# Patient Record
Sex: Female | Born: 1965 | Race: White | Hispanic: No | Marital: Married | Smoking: Former smoker
Health system: Southern US, Community
[De-identification: ages and names within clinical notes are randomized; demographics above are authoritative.]

## PROBLEM LIST (undated history)

## (undated) DIAGNOSIS — E079 Disorder of thyroid, unspecified: Secondary | ICD-10-CM

## (undated) DIAGNOSIS — J45909 Unspecified asthma, uncomplicated: Secondary | ICD-10-CM

## (undated) DIAGNOSIS — F329 Major depressive disorder, single episode, unspecified: Secondary | ICD-10-CM

## (undated) DIAGNOSIS — G43909 Migraine, unspecified, not intractable, without status migrainosus: Secondary | ICD-10-CM

## (undated) DIAGNOSIS — F319 Bipolar disorder, unspecified: Secondary | ICD-10-CM

## (undated) DIAGNOSIS — I10 Essential (primary) hypertension: Secondary | ICD-10-CM

## (undated) DIAGNOSIS — F32A Depression, unspecified: Secondary | ICD-10-CM

## (undated) HISTORY — DX: Essential (primary) hypertension: I10

## (undated) HISTORY — DX: Depression, unspecified: F32.A

## (undated) HISTORY — DX: Migraine, unspecified, not intractable, without status migrainosus: G43.909

## (undated) HISTORY — DX: Unspecified asthma, uncomplicated: J45.909

## (undated) HISTORY — DX: Disorder of thyroid, unspecified: E07.9

## (undated) HISTORY — DX: Bipolar disorder, unspecified: F31.9

---
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
## 2008-07-24 HISTORY — PX: ABDOMINAL HYSTERECTOMY: SHX81

## 2008-07-24 HISTORY — PX: TOTAL ABDOMINAL HYSTERECTOMY: SHX209

## 2011-07-25 HISTORY — PX: THYROIDECTOMY: SHX17

## 2012-07-24 HISTORY — PX: CHOLECYSTECTOMY: SHX55

## 2014-06-26 DIAGNOSIS — E890 Postprocedural hypothyroidism: Secondary | ICD-10-CM | POA: Insufficient documentation

## 2014-06-26 DIAGNOSIS — E039 Hypothyroidism, unspecified: Secondary | ICD-10-CM | POA: Insufficient documentation

## 2014-07-24 DIAGNOSIS — I639 Cerebral infarction, unspecified: Secondary | ICD-10-CM

## 2014-07-24 HISTORY — DX: Cerebral infarction, unspecified: I63.9

## 2019-10-17 ENCOUNTER — Telehealth: Payer: Self-pay | Admitting: *Deleted

## 2019-10-17 ENCOUNTER — Encounter: Payer: Self-pay | Admitting: Family Medicine

## 2019-10-17 ENCOUNTER — Ambulatory Visit (INDEPENDENT_AMBULATORY_CARE_PROVIDER_SITE_OTHER): Payer: PRIVATE HEALTH INSURANCE | Admitting: Family Medicine

## 2019-10-17 ENCOUNTER — Other Ambulatory Visit: Payer: Self-pay

## 2019-10-17 VITALS — BP 135/80 | HR 95 | Temp 98.9°F | Ht 66.0 in | Wt 246.8 lb

## 2019-10-17 DIAGNOSIS — F329 Major depressive disorder, single episode, unspecified: Secondary | ICD-10-CM | POA: Insufficient documentation

## 2019-10-17 DIAGNOSIS — Z1211 Encounter for screening for malignant neoplasm of colon: Secondary | ICD-10-CM

## 2019-10-17 DIAGNOSIS — J45909 Unspecified asthma, uncomplicated: Secondary | ICD-10-CM | POA: Insufficient documentation

## 2019-10-17 DIAGNOSIS — E039 Hypothyroidism, unspecified: Secondary | ICD-10-CM

## 2019-10-17 DIAGNOSIS — F324 Major depressive disorder, single episode, in partial remission: Secondary | ICD-10-CM

## 2019-10-17 DIAGNOSIS — J4520 Mild intermittent asthma, uncomplicated: Secondary | ICD-10-CM

## 2019-10-17 DIAGNOSIS — Z8673 Personal history of transient ischemic attack (TIA), and cerebral infarction without residual deficits: Secondary | ICD-10-CM

## 2019-10-17 DIAGNOSIS — F319 Bipolar disorder, unspecified: Secondary | ICD-10-CM

## 2019-10-17 DIAGNOSIS — F32A Depression, unspecified: Secondary | ICD-10-CM | POA: Insufficient documentation

## 2019-10-17 MED ORDER — LISINOPRIL 20 MG PO TABS: 20.0000 mg | ORAL_TABLET | Freq: Every day | ORAL | 1 refills | Status: DC

## 2019-10-17 MED ORDER — LEVOTHYROXINE SODIUM 150 MCG PO TABS: 150.0000 ug | ORAL_TABLET | Freq: Every day | ORAL | 1 refills | Status: DC

## 2019-10-17 MED ORDER — CARIPRAZINE HCL 3 MG PO CAPS: 3.0000 mg | ORAL_CAPSULE | Freq: Every day | ORAL | 1 refills | Status: DC

## 2019-10-17 MED ORDER — TRAZODONE HCL 100 MG PO TABS: 100.0000 mg | ORAL_TABLET | Freq: Every day | ORAL | 1 refills | Status: DC

## 2019-10-17 MED ORDER — HYDROXYZINE HCL 25 MG PO TABS: 25.0000 mg | ORAL_TABLET | Freq: Two times a day (BID) | ORAL | 1 refills | Status: DC

## 2019-10-17 MED ORDER — BUPROPION HCL ER (XL) 300 MG PO TB24: 300.0000 mg | ORAL_TABLET | Freq: Every day | ORAL | 1 refills | Status: DC

## 2019-10-17 NOTE — Telephone Encounter (Signed)
Prior Auth for [employer] 3mg  caps-In Process  (Key: [id number])  Your information has been sent to [facility].

## 2019-10-17 NOTE — Progress Notes (Signed)
New Patient Office Visit  Assessment & Plan:  1. Major depressive disorder with single episode, in partial remission ([place]) - Well controlled on current regimen.  - CMP14+EGFR  2. Bipolar 1 disorder ([place]) - Well controlled on current regimen.  - CMP14+EGFR  3. Mild intermittent asthma without complication - Well controlled on current regimen.   4. Hypothyroidism, unspecified type - TSH  5. History of stroke - Lipid panel - CBC with Differential/Platelet  6. Colon cancer screening - Ambulatory referral to Gastroenterology   Follow-up: Return for as directed after labs result.   Hendricks Limes, MSN, APRN, FNP-C [facility]  Subjective:  Patient ID: Belinda Turner, female    DOB: May 27, 1966  Age: 54 y.o. MRN: [id number]  Patient Care Team: Loman Brooklyn, FNP as PCP - General (Family Medicine)  CC:  Chief Complaint  Patient presents with  . New Patient (Initial Visit)    Dr. Selinda Eon  . Establish Care    HPI Brizeyda Holtmeyer presents to establish care. Patient transferring from Dr. Selinda Eon due to a recent move.   She has been through a lot recently with a move in January, her mother-in-law dying in January, and her father dying in February. She feels her medications are working well for her but she does still have days that she feels depressed. She does not desire a change in medication at this time.   Depression screen PHQ 2/9 10/17/2019  Decreased Interest 1  Down, Depressed, Hopeless 1  PHQ - 2 Score 2  Altered sleeping 0  Tired, decreased energy 2  Change in appetite 0  Feeling bad or failure about yourself  0  Trouble concentrating 1  Moving slowly or fidgety/restless 0  Suicidal thoughts 0  PHQ-9 Score 5  Difficult doing work/chores Somewhat difficult   GAD 7 : Generalized Anxiety Score 10/17/2019  Nervous, Anxious, on Edge 1  Control/stop worrying 0  Worry too much - different things 1  Trouble relaxing 0  Restless 0  Easily  annoyed or irritable 1  Afraid - awful might happen 0  Total GAD 7 Score 3  Anxiety Difficulty Not difficult at all    Patient rarely has to use her Albuterol inhaler.   She does report the last time she had labs done four months ago her levothyroxine dosage had to be decreased.    Review of Systems  Constitutional: Negative for chills, fever, malaise/fatigue and weight loss.  HENT: Negative for congestion, ear discharge, ear pain, nosebleeds, sinus pain, sore throat and tinnitus.   Eyes: Negative for blurred vision, double vision, pain, discharge and redness.  Respiratory: Negative for cough, shortness of breath and wheezing.   Cardiovascular: Negative for chest pain, palpitations and leg swelling.  Gastrointestinal: Negative for abdominal pain, constipation, diarrhea, heartburn, nausea and vomiting.  Genitourinary: Negative for dysuria, frequency and urgency.  Musculoskeletal: Negative for myalgias.  Skin: Negative for rash.  Neurological: Negative for dizziness, seizures, weakness and headaches.  Psychiatric/Behavioral: Negative for depression, substance abuse and suicidal ideas. The patient is not nervous/anxious.     Current Outpatient Medications:  .  albuterol (VENTOLIN HFA) 108 (90 Base) MCG/ACT inhaler, Inhale 2 puffs into the lungs every 6 (six) hours as needed., Disp: , Rfl:  .  buPROPion (WELLBUTRIN XL) 300 MG 24 hr tablet, Take 1 tablet (300 mg total) by mouth daily., Disp: 90 tablet, Rfl: 1 .  cariprazine (VRAYLAR) capsule, Take 1 capsule (3 mg total) by mouth daily., Disp: 90 capsule, Rfl:  1 .  Chromium-Cinnamon (CINNAMON PLUS CHROMIUM) [phone] MCG-MG CAPS, Take 1,000 mcg by mouth daily., Disp: , Rfl:  .  glucosamine-chondroitin 500-400 MG tablet, Take 2 tablets by mouth daily., Disp: , Rfl:  .  hydrOXYzine (ATARAX/VISTARIL) 25 MG tablet, Take 1 tablet (25 mg total) by mouth in the morning and at bedtime., Disp: 180 tablet, Rfl: 1 .  levothyroxine (SYNTHROID) 150 MCG  tablet, Take 1 tablet (150 mcg total) by mouth daily before breakfast., Disp: 90 tablet, Rfl: 1 .  lisinopril (ZESTRIL) 20 MG tablet, Take 1 tablet (20 mg total) by mouth daily., Disp: 90 tablet, Rfl: 1 .  magnesium oxide (MAG-OX) 400 MG tablet, Take 400 mg by mouth daily., Disp: , Rfl:  .  Multiple Vitamin (MULTIVITAMIN) tablet, Take 1 tablet by mouth daily., Disp: , Rfl:  .  traZODone (DESYREL) 100 MG tablet, Take 1 tablet (100 mg total) by mouth at bedtime., Disp: 90 tablet, Rfl: 1  Allergies  Allergen Reactions  . Cortisone Nausea And Vomiting and Swelling  . Sulfur Nausea And Vomiting and Swelling    Past Medical History:  Diagnosis Date  . Asthma   . Bipolar 1 disorder ([place])   . Depression   . Migraine   . Stroke [facility]) 2016   2 strokes  . Thyroid disease     Past Surgical History:  Procedure Laterality Date  . ABDOMINAL HYSTERECTOMY  2010  . CESAREAN SECTION  2005  . CESAREAN SECTION  2006  . THYROIDECTOMY  2013    Family History  Problem Relation Age of Onset  . Depression Mother   . Thyroid disease Mother   . Diabetes Father   . Thyroid disease Sister   . ADD / ADHD Son   . Autism Son   . Bipolar disorder Son   . Thyroid disease Maternal Grandmother   . Breast cancer Maternal Grandmother   . Diabetes Paternal Grandmother   . Diabetes Paternal Grandfather     Social History   Socioeconomic History  . Marital status: Married    Spouse name: Not on file  . Number of children: Not on file  . Years of education: Not on file  . Highest education level: Not on file  Occupational History  . Not on file  Tobacco Use  . Smoking status: Former Smoker    Types: Cigarettes    Quit date: 1984    Years since quitting: 37.2  . Smokeless tobacco: Never Used  Substance and Sexual Activity  . Alcohol use: Yes    Comment: occ  . Drug use: Never  . Sexual activity: Yes    Birth control/protection: None  Other Topics Concern  . Not on file  Social History  Narrative  . Not on file   Social Determinants of Health   Financial Resource Strain:   . Difficulty of Paying Living Expenses:   Food Insecurity:   . Worried About Charity fundraiser in the Last Year:   . Arboriculturist in the Last Year:   Transportation Needs:   . Film/video editor (Medical):   Marland Kitchen Lack of Transportation (Non-Medical):   Physical Activity:   . Days of Exercise per Week:   . Minutes of Exercise per Session:   Stress:   . Feeling of Stress :   Social Connections:   . Frequency of Communication with Friends and Family:   . Frequency of Social Gatherings with Friends and Family:   . Attends Religious Services:   .  Active Member of Clubs or Organizations:   . Attends Archivist Meetings:   Marland Kitchen Marital Status:   Intimate Partner Violence:   . Fear of Current or Ex-Partner:   . Emotionally Abused:   Marland Kitchen Physically Abused:   . Sexually Abused:     Objective:   Today's Vitals: BP 135/80   Pulse 95   Temp 98.9 F (37.2 C) (Temporal)   Ht '5\' 6"'  (1.676 m)   Wt 246 lb 12.8 oz (111.9 kg)   SpO2 93%   BMI 39.83 kg/m   Physical Exam Vitals reviewed.  Constitutional:      General: She is not in acute distress.    Appearance: Normal appearance. She is obese. She is not ill-appearing, toxic-appearing or diaphoretic.  HENT:     Head: Normocephalic and atraumatic.  Eyes:     General: No scleral icterus.       Right eye: No discharge.        Left eye: No discharge.     Conjunctiva/sclera: Conjunctivae normal.  Cardiovascular:     Rate and Rhythm: Normal rate and regular rhythm.     Heart sounds: Normal heart sounds. No murmur. No friction rub. No gallop.   Pulmonary:     Effort: Pulmonary effort is normal. No respiratory distress.     Breath sounds: Normal breath sounds. No stridor. No wheezing, rhonchi or rales.  Musculoskeletal:        General: Normal range of motion.     Cervical back: Normal range of motion.  Skin:    General: Skin is warm  and dry.     Capillary Refill: Capillary refill takes less than 2 seconds.  Neurological:     General: No focal deficit present.     Mental Status: She is alert and oriented to person, place, and time. Mental status is at baseline.  Psychiatric:        Mood and Affect: Mood normal.        Behavior: Behavior normal.        Thought Content: Thought content normal.        Judgment: Judgment normal.

## 2019-10-18 LAB — CBC WITH DIFFERENTIAL/PLATELET
Basophils Absolute: 0 10*3/uL (ref 0.0–0.2)
Basos: 1 %
EOS (ABSOLUTE): 0.2 10*3/uL (ref 0.0–0.4)
Eos: 3 %
Hematocrit: 41.6 % (ref 34.0–46.6)
Hemoglobin: 13.3 g/dL (ref 11.1–15.9)
Immature Grans (Abs): 0 10*3/uL (ref 0.0–0.1)
Immature Granulocytes: 0 %
Lymphocytes Absolute: 1.8 10*3/uL (ref 0.7–3.1)
Lymphs: 27 %
MCH: 27.4 pg (ref 26.6–33.0)
MCHC: 32 g/dL (ref 31.5–35.7)
MCV: 86 fL (ref 79–97)
Monocytes Absolute: 0.7 10*3/uL (ref 0.1–0.9)
Monocytes: 11 %
Neutrophils Absolute: 4 10*3/uL (ref 1.4–7.0)
Neutrophils: 58 %
Platelets: 247 10*3/uL (ref 150–450)
RBC: 4.86 x10E6/uL (ref 3.77–5.28)
RDW: 14.1 % (ref 11.7–15.4)
WBC: 6.7 10*3/uL (ref 3.4–10.8)

## 2019-10-18 LAB — CMP14+EGFR
ALT: 11 IU/L (ref 0–32)
AST: 16 IU/L (ref 0–40)
Albumin/Globulin Ratio: 2.1 (ref 1.2–2.2)
Albumin: 4.2 g/dL (ref 3.8–4.9)
Alkaline Phosphatase: 97 IU/L (ref 39–117)
BUN/Creatinine Ratio: 12 (ref 9–23)
BUN: 13 mg/dL (ref 6–24)
Bilirubin Total: 0.4 mg/dL (ref 0.0–1.2)
CO2: 25 mmol/L (ref 20–29)
Calcium: 9.4 mg/dL (ref 8.7–10.2)
Chloride: 102 mmol/L (ref 96–106)
Creatinine, Ser: 1.05 mg/dL — ABNORMAL HIGH (ref 0.57–1.00)
GFR calc Af Amer: 70 mL/min/{1.73_m2} (ref >59)
GFR calc non Af Amer: 60 mL/min/{1.73_m2} (ref >59)
Globulin, Total: 2 g/dL (ref 1.5–4.5)
Glucose: 89 mg/dL (ref 65–99)
Potassium: 4.3 mmol/L (ref 3.5–5.2)
Sodium: 141 mmol/L (ref 134–144)
Total Protein: 6.2 g/dL (ref 6.0–8.5)

## 2019-10-18 LAB — LIPID PANEL
Chol/HDL Ratio: 2.7 ratio (ref 0.0–4.4)
Cholesterol, Total: 137 mg/dL (ref 100–199)
HDL: 50 mg/dL (ref >39)
LDL Chol Calc (NIH): 70 mg/dL (ref 0–99)
Triglycerides: 91 mg/dL (ref 0–149)
VLDL Cholesterol Cal: 17 mg/dL (ref 5–40)

## 2019-10-18 LAB — TSH: TSH: 3.29 u[IU]/mL (ref 0.450–4.500)

## 2019-10-20 NOTE — Telephone Encounter (Signed)
Prior Auth for Vraylar 3mg -APPROVED till 10/16/20  Pharmacy notified

## 2019-10-21 ENCOUNTER — Encounter: Payer: Self-pay | Admitting: Internal Medicine

## 2019-12-09 NOTE — Progress Notes (Signed)
Referring Provider: Gwenlyn Fudge, FNP Primary Care Physician:  Gwenlyn Fudge, FNP  Primary GI: Dr. Jena Gauss  Patient Location: Home   Provider Location: [facility] office   Reason for Visit: Consult colonoscopy.    Persons present on the virtual encounter, with roles:  Ermalinda Memos, PA-C (provider), Belinda Turner (Patient)    Total time (minutes) spent on medical discussion: 15 minutes  Virtual Visit via Telephone Note Due to COVID-19, visit is conducted virtually and was requested by patient.   I connected with Belinda Turner on 12/10/19 at 11:00 AM EDT by video and verified that I am speaking with the correct person using two identifiers.   I discussed the limitations, risks, security and privacy concerns of performing an evaluation and management service by video and the availability of in person appointments. I also discussed with the patient that there may be a patient responsible charge related to this service. The patient expressed understanding and agreed to proceed.  Chief Complaint  Patient presents with  . Consult    TCS never done prior. no FH colon cancer/no polyps. NO concerns    History of Present Illness: Belinda Turner is a 54 year old female presenting today at the request of Gwenlyn Fudge, FNP for colon cancer screening.  Office visit due to medications.  Past medical history significant for asthma, depression, bipolar disorder, migraine, stroke, and hypothyroidism.  Patient establish care with [facility] 10/17/2019.  Her chronic medical conditions were well controlled with plans to continue her current medications.  She was referred to GI for colon cancer screening and labs were updated.  Reviewed labs from March 2021.  CBC WNL, CMP remarkable for creatinine 1.05, otherwise WNL, TSH WNL, lipid panel WNL.   Today: No prior colonoscopy. No GI complaints. No abdominal pain. BM daily. No constipation or diarrhea. No blood in the stool  or black stools. No unintentional weight loss. No family history of colon cancer.   No N/V, GERD symptoms, or dysphagia.   Per last office visit with  PCP 10/17/19: Weight: 246 pounds Height 5\' 6"  BMI 39.8 BP 135/80  Past Medical History:  Diagnosis Date  . Asthma   . Bipolar 1 disorder ([facility])   . Depression   . Migraine   . Stroke [facility]) 2016   2 strokes  . Thyroid disease      Past Surgical History:  Procedure Laterality Date  . ABDOMINAL HYSTERECTOMY  2010  . CESAREAN SECTION  2005  . CESAREAN SECTION  2006  . CHOLECYSTECTOMY  2014  . THYROIDECTOMY  2013     Current Meds  Medication Sig  . albuterol (VENTOLIN HFA) 108 (90 Base) MCG/ACT inhaler Inhale 2 puffs into the lungs every 6 (six) hours as needed.  2014 buPROPion (WELLBUTRIN XL) 300 MG 24 hr tablet Take 1 tablet (300 mg total) by mouth daily.  . cariprazine (VRAYLAR) capsule Take 1 capsule (3 mg total) by mouth daily.  . Chromium-Cinnamon (CINNAMON PLUS CHROMIUM) [phone] MCG-MG CAPS Take 1,000 mcg by mouth daily.  Marland Kitchen glucosamine-chondroitin 500-400 MG tablet Take 2 tablets by mouth daily.  . hydrOXYzine (ATARAX/VISTARIL) 25 MG tablet Take 1 tablet (25 mg total) by mouth in the morning and at bedtime.  Marland Kitchen levothyroxine (SYNTHROID) 150 MCG tablet Take 1 tablet (150 mcg total) by mouth daily before breakfast.  . lisinopril (ZESTRIL) 20 MG tablet Take 1 tablet (20 mg total) by mouth daily.  . magnesium oxide (MAG-OX) 400 MG tablet Take 400 mg by  mouth daily.  . Multiple Vitamin (MULTIVITAMIN) tablet Take 1 tablet by mouth daily.  . traZODone (DESYREL) 100 MG tablet Take 1 tablet (100 mg total) by mouth at bedtime.     Family History  Problem Relation Age of Onset  . Depression Mother   . Thyroid disease Mother   . Diabetes Father   . Thyroid disease Sister   . ADD / ADHD Son   . Autism Son   . Bipolar disorder Son   . Thyroid disease Maternal Grandmother   . Breast cancer Maternal Grandmother   . Diabetes  Paternal Grandmother   . Diabetes Paternal Grandfather   . Colon cancer Neg Hx     Social History   Socioeconomic History  . Marital status: Married    Spouse name: Not on file  . Number of children: Not on file  . Years of education: Not on file  . Highest education level: Not on file  Occupational History  . Not on file  Tobacco Use  . Smoking status: Former Smoker    Types: Cigarettes    Quit date: 1984    Years since quitting: 37.4  . Smokeless tobacco: Never Used  Substance and Sexual Activity  . Alcohol use: Yes    Comment: occ- 1-2 times a month.   . Drug use: Never  . Sexual activity: Yes    Birth control/protection: None  Other Topics Concern  . Not on file  Social History Narrative  . Not on file   Social Determinants of Health   Financial Resource Strain:   . Difficulty of Paying Living Expenses:   Food Insecurity:   . Worried About Programme researcher, broadcasting/film/video in the Last Year:   . Barista in the Last Year:   Transportation Needs:   . Freight forwarder (Medical):   Marland Kitchen Lack of Transportation (Non-Medical):   Physical Activity:   . Days of Exercise per Week:   . Minutes of Exercise per Session:   Stress:   . Feeling of Stress :   Social Connections:   . Frequency of Communication with Friends and Family:   . Frequency of Social Gatherings with Friends and Family:   . Attends Religious Services:   . Active Member of Clubs or Organizations:   . Attends Banker Meetings:   Marland Kitchen Marital Status:        Review of Systems: Gen: Denies fever, chills, lightheadedness, dizziness, presyncope, syncope CV: Denies chest pain or heart palpitations Resp: Denies dyspnea at rest or cough GI: see HPI Derm: Denies rash Psych: Admits to depression.  Medications working well. Heme: Denies bruising or bleeding  Observations/Objective: No distress. Alert and oriented. Pleasant. Well nourished. Normal mood and affect. Unable to perform complete  physical exam due to video encounter.   Assessment and Plan: 54 year old female with history of bipolar disorder, depression, stroke, hypothyroidism, and asthma presenting to schedule her first ever colonoscopy.  No significant upper or lower GI symptoms.  No alarm symptoms.  No family history of colon cancer.  Proceed with colonoscopy with propofol with Dr. Jena Gauss in the near future. The risks, benefits, and alternatives have been discussed in detail with patient. They have stated understanding and desire to proceed.  Propofol due to medications. Follow-up as recommended at the time of colonoscopy.  Follow Up Instructions: Follow-up as recommended at the time of colonoscopy   I discussed the assessment and treatment plan with the patient. The patient was provided an  opportunity to ask questions and all were answered. The patient agreed with the plan and demonstrated an understanding of the instructions.   The patient was advised to call back or seek an in-person evaluation if the symptoms worsen or if the condition fails to improve as anticipated.  I provided 15 minutes of non-face-to-face time during this encounter.  Aliene Altes, PA-C [facility]

## 2019-12-10 ENCOUNTER — Other Ambulatory Visit: Payer: Self-pay

## 2019-12-10 ENCOUNTER — Telehealth (INDEPENDENT_AMBULATORY_CARE_PROVIDER_SITE_OTHER): Payer: PRIVATE HEALTH INSURANCE | Admitting: Gastroenterology

## 2019-12-10 ENCOUNTER — Encounter: Payer: Self-pay | Admitting: Gastroenterology

## 2019-12-10 DIAGNOSIS — Z1211 Encounter for screening for malignant neoplasm of colon: Secondary | ICD-10-CM | POA: Diagnosis not present

## 2019-12-10 NOTE — Patient Instructions (Signed)
We will get you scheduled for colonoscopy in the near future with Dr. Jena Gauss.   We will see you as recommended at the time of colonoscopy.  Ermalinda Memos, PA-C [facility]

## 2019-12-17 ENCOUNTER — Telehealth: Payer: Self-pay | Admitting: *Deleted

## 2019-12-17 NOTE — Telephone Encounter (Signed)
Belinda Turner, you are scheduled for a virtual visit with your provider today.  Just as we do with appointments in the office, we must obtain your consent to participate.  Your consent will be active for this visit and any virtual visit you may have with one of our providers in the next 365 days.  If you have a MyChart account, I can also send a copy of this consent to you electronically.  All virtual visits are billed to your insurance company just like a traditional visit in the office.  As this is a virtual visit, video technology does not allow for your provider to perform a traditional examination.  This may limit your provider's ability to fully assess your condition.  If your provider identifies any concerns that need to be evaluated in person or the need to arrange testing such as labs, EKG, etc, we will make arrangements to do so.  Although advances in technology are sophisticated, we cannot ensure that it will always work on either your end or our end.  If the connection with a video visit is poor, we may have to switch to a telephone visit.  With either a video or telephone visit, we are not always able to ensure that we have a secure connection.   I need to obtain your verbal consent now.   Are you willing to proceed with your visit today?

## 2019-12-17 NOTE — Telephone Encounter (Signed)
Pt consented to virtual visit on 12/10/19. 

## 2020-01-30 ENCOUNTER — Telehealth: Payer: Self-pay

## 2020-01-30 NOTE — Telephone Encounter (Signed)
Called pt, TCS w/Prop w/RMR (ASA 3) scheduled for 03/01/20 at 2:00pm. Orders entered.

## 2020-02-02 NOTE — Telephone Encounter (Signed)
Pre-op/covid test 02/27/20. Letter mailed with procedure instructions. 

## 2020-02-04 ENCOUNTER — Other Ambulatory Visit: Payer: Self-pay

## 2020-02-04 NOTE — Telephone Encounter (Signed)
Called [employer], spoke to [place], no PA needed for TCS. Ref# [id number].

## 2020-02-26 NOTE — OR Nursing (Signed)
Patient informed to arrive at 1230 on Monday for procedure.

## 2020-02-26 NOTE — Anesthesia Preprocedure Evaluation (Addendum)
Anesthesia Evaluation  Patient identified by MRN, date of birth, ID band Patient awake    Reviewed: Allergy & Precautions, H&P , NPO status , Patient's Chart, lab work & pertinent test results, reviewed documented beta blocker date and time   Airway Mallampati: II  TM Distance: >3 FB Neck ROM: full    Dental no notable dental hx. (+) Teeth Intact, Loose   Pulmonary asthma , former smoker,    Pulmonary exam normal breath sounds clear to auscultation       Cardiovascular Exercise Tolerance: Good negative cardio ROS   Rhythm:regular Rate:Normal     Neuro/Psych  Headaches, PSYCHIATRIC DISORDERS Depression Bipolar Disorder CVA, No Residual Symptoms    GI/Hepatic negative GI ROS, Neg liver ROS,   Endo/Other  Hypothyroidism   Renal/GU negative Renal ROS  negative genitourinary   Musculoskeletal   Abdominal   Peds  Hematology negative hematology ROS (+)   Anesthesia Other Findings Bottom 2 front teeth loose  Reproductive/Obstetrics negative OB ROS                            Anesthesia Physical Anesthesia Plan  ASA: III  Anesthesia Plan: General   Post-op Pain Management:    Induction:   PONV Risk Score and Plan: Propofol infusion  Airway Management Planned:   Additional Equipment:   Intra-op Plan:   Post-operative Plan:   Informed Consent: I have reviewed the patients History and Physical, chart, labs and discussed the procedure including the risks, benefits and alternatives for the proposed anesthesia with the patient or authorized representative who has indicated his/her understanding and acceptance.     Dental Advisory Given  Plan Discussed with: CRNA  Anesthesia Plan Comments:         Anesthesia Quick Evaluation

## 2020-02-27 ENCOUNTER — Encounter (HOSPITAL_COMMUNITY)
Admission: RE | Admit: 2020-02-27 | Discharge: 2020-02-27 | Disposition: A | Discharge status: Home / Self Care | Payer: PRIVATE HEALTH INSURANCE | Source: Ambulatory Visit | Attending: Internal Medicine | Admitting: Internal Medicine

## 2020-02-27 ENCOUNTER — Other Ambulatory Visit (HOSPITAL_COMMUNITY)
Admission: RE | Admit: 2020-02-27 | Discharge: 2020-02-27 | Disposition: A | Discharge status: Home / Self Care | Payer: PRIVATE HEALTH INSURANCE | Source: Ambulatory Visit | Attending: Internal Medicine | Admitting: Internal Medicine

## 2020-02-27 ENCOUNTER — Other Ambulatory Visit: Payer: Self-pay

## 2020-02-27 DIAGNOSIS — Z20822 Contact with and (suspected) exposure to covid-19: Secondary | ICD-10-CM | POA: Diagnosis not present

## 2020-02-27 DIAGNOSIS — Z01812 Encounter for preprocedural laboratory examination: Secondary | ICD-10-CM | POA: Diagnosis present

## 2020-02-27 LAB — SARS CORONAVIRUS 2 (TAT 6-24 HRS): SARS Coronavirus 2: NEGATIVE

## 2020-03-01 ENCOUNTER — Ambulatory Visit (HOSPITAL_COMMUNITY)
Admission: RE | Admit: 2020-03-01 | Discharge: 2020-03-01 | Disposition: A | Discharge status: Home / Self Care | Payer: PRIVATE HEALTH INSURANCE | Attending: Internal Medicine | Admitting: Internal Medicine

## 2020-03-01 ENCOUNTER — Ambulatory Visit (HOSPITAL_COMMUNITY): Payer: PRIVATE HEALTH INSURANCE | Admitting: Anesthesiology

## 2020-03-01 ENCOUNTER — Encounter (HOSPITAL_COMMUNITY)
Admission: RE | Disposition: A | Discharge status: Home / Self Care | Payer: Self-pay | Source: Home / Self Care | Attending: Internal Medicine

## 2020-03-01 ENCOUNTER — Encounter (HOSPITAL_COMMUNITY): Payer: Self-pay | Admitting: Internal Medicine

## 2020-03-01 ENCOUNTER — Other Ambulatory Visit: Payer: Self-pay

## 2020-03-01 DIAGNOSIS — F319 Bipolar disorder, unspecified: Secondary | ICD-10-CM | POA: Diagnosis not present

## 2020-03-01 DIAGNOSIS — Z833 Family history of diabetes mellitus: Secondary | ICD-10-CM | POA: Insufficient documentation

## 2020-03-01 DIAGNOSIS — Z87891 Personal history of nicotine dependence: Secondary | ICD-10-CM | POA: Insufficient documentation

## 2020-03-01 DIAGNOSIS — R519 Headache, unspecified: Secondary | ICD-10-CM | POA: Insufficient documentation

## 2020-03-01 DIAGNOSIS — Z7951 Long term (current) use of inhaled steroids: Secondary | ICD-10-CM | POA: Diagnosis not present

## 2020-03-01 DIAGNOSIS — Z8673 Personal history of transient ischemic attack (TIA), and cerebral infarction without residual deficits: Secondary | ICD-10-CM | POA: Diagnosis not present

## 2020-03-01 DIAGNOSIS — E039 Hypothyroidism, unspecified: Secondary | ICD-10-CM | POA: Insufficient documentation

## 2020-03-01 DIAGNOSIS — J45909 Unspecified asthma, uncomplicated: Secondary | ICD-10-CM | POA: Diagnosis not present

## 2020-03-01 DIAGNOSIS — Z888 Allergy status to other drugs, medicaments and biological substances status: Secondary | ICD-10-CM | POA: Diagnosis not present

## 2020-03-01 DIAGNOSIS — Z882 Allergy status to sulfonamides status: Secondary | ICD-10-CM | POA: Insufficient documentation

## 2020-03-01 DIAGNOSIS — Z8349 Family history of other endocrine, nutritional and metabolic diseases: Secondary | ICD-10-CM | POA: Insufficient documentation

## 2020-03-01 DIAGNOSIS — Z90710 Acquired absence of both cervix and uterus: Secondary | ICD-10-CM | POA: Diagnosis not present

## 2020-03-01 DIAGNOSIS — Z818 Family history of other mental and behavioral disorders: Secondary | ICD-10-CM | POA: Insufficient documentation

## 2020-03-01 DIAGNOSIS — Z1211 Encounter for screening for malignant neoplasm of colon: Secondary | ICD-10-CM

## 2020-03-01 DIAGNOSIS — Z803 Family history of malignant neoplasm of breast: Secondary | ICD-10-CM | POA: Insufficient documentation

## 2020-03-01 DIAGNOSIS — K5730 Diverticulosis of large intestine without perforation or abscess without bleeding: Secondary | ICD-10-CM | POA: Insufficient documentation

## 2020-03-01 DIAGNOSIS — Z9049 Acquired absence of other specified parts of digestive tract: Secondary | ICD-10-CM | POA: Insufficient documentation

## 2020-03-01 DIAGNOSIS — Z79899 Other long term (current) drug therapy: Secondary | ICD-10-CM | POA: Insufficient documentation

## 2020-03-01 HISTORY — PX: COLONOSCOPY WITH PROPOFOL: SHX5780

## 2020-03-01 SURGERY — COLONOSCOPY WITH PROPOFOL
Anesthesia: General

## 2020-03-01 MED ORDER — PROPOFOL 10 MG/ML IV BOLUS
INTRAVENOUS | Status: DC | PRN
  Administered 2020-03-01: 30 mg via INTRAVENOUS
  Administered 2020-03-01: 100 mg via INTRAVENOUS

## 2020-03-01 MED ORDER — PROPOFOL 500 MG/50ML IV EMUL
INTRAVENOUS | Status: DC | PRN
  Administered 2020-03-01: 75 ug/kg/min via INTRAVENOUS

## 2020-03-01 MED ORDER — CHLORHEXIDINE GLUCONATE CLOTH 2 % EX PADS: 6.0000 | MEDICATED_PAD | Freq: Once | CUTANEOUS | Status: DC

## 2020-03-01 MED ORDER — LIDOCAINE HCL (CARDIAC) PF 50 MG/5ML IV SOSY
PREFILLED_SYRINGE | INTRAVENOUS | Status: DC | PRN
  Administered 2020-03-01: 100 mg via INTRAVENOUS

## 2020-03-01 MED ORDER — LACTATED RINGERS IV SOLN: INTRAVENOUS | Status: DC

## 2020-03-01 NOTE — Discharge Instructions (Signed)
Colonoscopy Discharge Instructions  Read the instructions outlined below and refer to this sheet in the next few weeks. These discharge instructions provide you with general information on caring for yourself after you leave the hospital. Your doctor may also give you specific instructions. While your treatment has been planned according to the most current medical practices available, unavoidable complications occasionally occur. If you have any problems or questions after discharge, call Dr. Jena Gauss at [phone]. ACTIVITY  You may resume your regular activity, but move at a slower pace for the next 24 hours.   Take frequent rest periods for the next 24 hours.   Walking will help get rid of the air and reduce the bloated feeling in your belly (abdomen).   No driving for 24 hours (because of the medicine (anesthesia) used during the test).    Do not sign any important legal documents or operate any machinery for 24 hours (because of the anesthesia used during the test).  NUTRITION  Drink plenty of fluids.   You may resume your normal diet as instructed by your doctor.   Begin with a light meal and progress to your normal diet. Heavy or fried foods are harder to digest and may make you feel sick to your stomach (nauseated).   Avoid alcoholic beverages for 24 hours or as instructed.  MEDICATIONS  You may resume your normal medications unless your doctor tells you otherwise.  WHAT YOU CAN EXPECT TODAY  Some feelings of bloating in the abdomen.   Passage of more gas than usual.   Spotting of blood in your stool or on the toilet paper.  IF YOU HAD POLYPS REMOVED DURING THE COLONOSCOPY:  No aspirin products for 7 days or as instructed.   No alcohol for 7 days or as instructed.   Eat a soft diet for the next 24 hours.  FINDING OUT THE RESULTS OF YOUR TEST Not all test results are available during your visit. If your test results are not back during the visit, make an appointment  with your caregiver to find out the results. Do not assume everything is normal if you have not heard from your caregiver or the medical facility. It is important for you to follow up on all of your test results.  SEEK IMMEDIATE MEDICAL ATTENTION IF:  You have more than a spotting of blood in your stool.   Your belly is swollen (abdominal distention).   You are nauseated or vomiting.   You have a temperature over 101.   You have abdominal pain or discomfort that is severe or gets worse throughout the day.   Diverticulosis information provided  No polyps found today.  I recommend a repeat colonoscopy for screening purposes in 10 years  At patient request, I called Angelic Schnelle at [phone]   -discussed results and recommendations    Diverticulosis  Diverticulosis is a condition that develops when small pouches (diverticula) form in the wall of the large intestine (colon). The colon is where water is absorbed and stool (feces) is formed. The pouches form when the inside layer of the colon pushes through weak spots in the outer layers of the colon. You may have a few pouches or many of them. The pouches usually do not cause problems unless they become inflamed or infected. When this happens, the condition is called diverticulitis. What are the causes? The cause of this condition is not known. What increases the risk? The following factors may make you more likely to develop this  condition:  Being older than age 3. Your risk for this condition increases with age. Diverticulosis is rare among people younger than age 60. By age 42, many people have it.  Eating a low-fiber diet.  Having frequent constipation.  Being overweight.  Not getting enough exercise.  Smoking.  Taking over-the-counter pain medicines, like aspirin and ibuprofen.  Having a family history of diverticulosis. What are the signs or symptoms? In most people, there are no symptoms of this condition. If you  do have symptoms, they may include:  Bloating.  Cramps in the abdomen.  Constipation or diarrhea.  Pain in the lower left side of the abdomen. How is this diagnosed? Because diverticulosis usually has no symptoms, it is most often diagnosed during an exam for other colon problems. The condition may be diagnosed by:  Using a flexible scope to examine the colon (colonoscopy).  Taking an X-ray of the colon after dye has been put into the colon (barium enema).  Having a CT scan. How is this treated? You may not need treatment for this condition. Your health care provider may recommend treatment to prevent problems. You may need treatment if you have symptoms or if you previously had diverticulitis. Treatment may include:  Eating a high-fiber diet.  Taking a fiber supplement.  Taking a live bacteria supplement (probiotic).  Taking medicine to relax your colon. Follow these instructions at home: Medicines  Take over-the-counter and prescription medicines only as told by your health care provider.  If told by your health care provider, take a fiber supplement or probiotic. Constipation prevention Your condition may cause constipation. To prevent or treat constipation, you may need to:  Drink enough fluid to keep your urine pale yellow.  Take over-the-counter or prescription medicines.  Eat foods that are high in fiber, such as beans, whole grains, and fresh fruits and vegetables.  Limit foods that are high in fat and processed sugars, such as fried or sweet foods.  General instructions  Try not to strain when you have a bowel movement.  Keep all follow-up visits as told by your health care provider. This is important. Contact a health care provider if you:  Have pain in your abdomen.  Have bloating.  Have cramps.  Have not had a bowel movement in 3 days. Get help right away if:  Your pain gets worse.  Your bloating becomes very bad.  You have a fever or  chills, and your symptoms suddenly get worse.  You vomit.  You have bowel movements that are bloody or black.  You have bleeding from your rectum. Summary  Diverticulosis is a condition that develops when small pouches (diverticula) form in the wall of the large intestine (colon).  You may have a few pouches or many of them.  This condition is most often diagnosed during an exam for other colon problems.  Treatment may include increasing the fiber in your diet, taking supplements, or taking medicines. This information is not intended to replace advice given to you by your health care provider. Make sure you discuss any questions you have with your health care provider. Document Revised: 02/06/2019 Document Reviewed: 02/06/2019 Elsevier Patient Education  [place].

## 2020-03-01 NOTE — Op Note (Signed)
[facility] Patient Name: Belinda Turner Procedure Date: 03/01/2020 4:03 PM MRN: [id number] Date of Birth: [birth year] Attending MD: Gennette Pac , MD CSN: [id number] Age: 54 Admit Type: Outpatient Procedure:                Colonoscopy Indications:              Screening for colorectal malignant neoplasm Providers:                Gennette Pac, MD, Criselda Peaches. Patsy Lager, RN,                            Edythe Clarity, Technician Referring MD:              Medicines:                Propofol per Anesthesia Complications:            No immediate complications. Estimated Blood Loss:     Estimated blood loss: none. Procedure:                Pre-Anesthesia Assessment:                           - Prior to the procedure, a History and Physical                            was performed, and patient medications and                            allergies were reviewed. The patient's tolerance of                            previous anesthesia was also reviewed. The risks                            and benefits of the procedure and the sedation                            options and risks were discussed with the patient.                            All questions were answered, and informed consent                            was obtained. ASA Grade Assessment: III - A patient                            with severe systemic disease. After reviewing the                            risks and benefits, the patient was deemed in                            satisfactory condition to undergo the procedure.  After obtaining informed consent, the colonoscope                            was passed under direct vision. Throughout the                            procedure, the patient's blood pressure, pulse, and                            oxygen saturations were monitored continuously. The                            CF-[id number] ([id number] scope was introduced through                             the anus and advanced to the the cecum, identified                            by appendiceal orifice and ileocecal valve. The                            colonoscopy was performed without difficulty. The                            patient tolerated the procedure well. The quality                            of the bowel preparation was adequate. Scope In: 4:21:15 PM Scope Out: 4:30:54 PM Scope Withdrawal Time: 0 hours 7 minutes 32 seconds  Total Procedure Duration: 0 hours 9 minutes 39 seconds  Findings:      The perianal and digital rectal examinations were normal.      Scattered medium-mouthed diverticula were found in the sigmoid colon and       descending colon.      The exam was otherwise without abnormality on direct and retroflexion       views. Impression:               - Diverticulosis in the sigmoid colon and in the                            descending colon.                           - The examination was otherwise normal on direct                            views. Rectal vault too small to retroflex. Rectal                            mucosa seen well on?"face. Moderate Sedation:      Moderate (conscious) sedation was personally administered by an       anesthesia professional. The following parameters were monitored: oxygen       saturation, heart rate, blood pressure, respiratory rate, EKG, adequacy  of pulmonary ventilation, and response to care. Recommendation:           - Patient has a contact number available for                            emergencies. The signs and symptoms of potential                            delayed complications were discussed with the                            patient. Return to normal activities tomorrow.                            Written discharge instructions were provided to the                            patient.                           - Advance diet as tolerated today.                           - Repeat colonoscopy in  10 years for screening                            purposes.                           - Return to GI clinic PRN. Procedure Code(s):        --- Professional ---                           [id number], Colonoscopy, flexible; diagnostic, including                            collection of specimen(s) by brushing or washing,                            when performed (separate procedure) Diagnosis Code(s):        --- Professional ---                           Z12.11, Encounter for screening for malignant                            neoplasm of colon                           K57.30, Diverticulosis of large intestine without                            perforation or abscess without bleeding CPT copyright 2019 American Medical Association. All rights reserved. The codes documented in this report are preliminary and upon coder review may  be revised to meet current compliance requirements. Gerrit Friends. Jena Gauss, MD Gennette Pac, MD 03/01/2020  4:38:21 PM This report has been signed electronically. Number of Addenda: 0

## 2020-03-01 NOTE — Anesthesia Postprocedure Evaluation (Signed)
Anesthesia Post Note  Patient: Belinda Turner  Procedure(s) Performed: COLONOSCOPY WITH PROPOFOL (N/A )  Patient location during evaluation: PACU Anesthesia Type: General Level of consciousness: awake Pain management: pain level controlled Vital Signs Assessment: post-procedure vital signs reviewed and stable Respiratory status: spontaneous breathing Cardiovascular status: blood pressure returned to baseline Anesthetic complications: no   No complications documented.   Last Vitals:  Vitals:   03/01/20 1645 03/01/20 1655  BP: 122/70 126/76  Pulse: 83 88  Resp: 17 12  Temp:    SpO2: 99% 100%    Last Pain:  Vitals:   03/01/20 1655  TempSrc:   PainSc: 0-No pain                 Windell Norfolk

## 2020-03-01 NOTE — Anesthesia Procedure Notes (Addendum)
Date/Time: 03/01/2020 4:13 PM Performed by: Franco Nones, CRNA Pre-anesthesia Checklist: Patient identified, Emergency Drugs available, Suction available, Timeout performed and Patient being monitored Patient Re-evaluated:Patient Re-evaluated prior to induction Oxygen Delivery Method: Nasal Cannula

## 2020-03-01 NOTE — Progress Notes (Signed)
D/C instructions reviewed with husband and pt. Voiced understanding. Pt dressed in PACU and D/C to home in good condition.

## 2020-03-01 NOTE — H&P (Signed)
@LOGO @   Primary Care Physician:  , FNP Primary Gastroenterologist:  Dr. Gwenlyn Fudge  Pre-Procedure History & Physical: HPI:  Belinda Turner is a 54 y.o. female is here for a screening colonoscopy.   Past Medical History:  Diagnosis Date  . Asthma   . Bipolar 1 disorder (HCC)   . Depression   . Migraine   . Stroke [facility]) 2016   2 strokes  . Thyroid disease     Past Surgical History:  Procedure Laterality Date  . ABDOMINAL HYSTERECTOMY  2010  . CESAREAN SECTION  2005  . CESAREAN SECTION  2006  . CHOLECYSTECTOMY  2014  . THYROIDECTOMY  2013    Prior to Admission medications   Medication Sig Start Date End Date Taking? Authorizing Provider  albuterol (VENTOLIN HFA) 108 (90 Base) MCG/ACT inhaler Inhale 2 puffs into the lungs every 6 (six) hours as needed for wheezing or shortness of breath.    Yes [provider]  buPROPion (WELLBUTRIN XL) 300 MG 24 hr tablet Take 1 tablet (300 mg total) by mouth daily. 10/17/19  Yes 10/19/19 F, FNP  cariprazine (VRAYLAR) capsule Take 1 capsule (3 mg total) by mouth daily. 10/17/19  Yes 10/19/19 F, FNP  Chromium-Cinnamon (CINNAMON PLUS CHROMIUM) [phone] MCG-MG CAPS Take 1,000 mcg by mouth daily.   Yes [provider]  glucosamine-chondroitin 500-400 MG tablet Take 2 tablets by mouth daily.   Yes [provider]  hydrOXYzine (ATARAX/VISTARIL) 25 MG tablet Take 1 tablet (25 mg total) by mouth in the morning and at bedtime. 10/17/19  Yes 10/19/19 F, FNP  levothyroxine (SYNTHROID) 150 MCG tablet Take 1 tablet (150 mcg total) by mouth daily before breakfast. 10/17/19  Yes 10/19/19 F, FNP  lisinopril (ZESTRIL) 20 MG tablet Take 1 tablet (20 mg total) by mouth daily. 10/17/19  Yes 10/19/19 F, FNP  magnesium oxide (MAG-OX) 400 MG tablet Take 400 mg by mouth daily.   Yes [provider]  Multiple Vitamin (MULTIVITAMIN) tablet Take 1 tablet by mouth daily.   Yes [provider]  traZODone (DESYREL) 100 MG tablet Take 1 tablet (100 mg total) by mouth at bedtime. 10/17/19  Yes 10/19/19 F, FNP    Allergies as of 01/30/2020 - Review Complete 12/10/2019  Allergen Reaction Noted  . Cortisone Nausea And Vomiting and Swelling 06/26/2014  . Sulfur Nausea And Vomiting and Swelling 06/26/2014    Family History  Problem Relation Age of Onset  . Depression Mother   . Thyroid disease Mother   . Diabetes Father   . Thyroid disease Sister   . ADD / ADHD Son   . Autism Son   . Bipolar disorder Son   . Thyroid disease Maternal Grandmother   . Breast cancer Maternal Grandmother   . Diabetes Paternal Grandmother   . Diabetes Paternal Grandfather   . Colon cancer Neg Hx     Social History   Socioeconomic History  . Marital status: Married    Spouse name: Not on file  . Number of children: Not on file  . Years of education: Not on file  . Highest education level: Not on file  Occupational History  . Not on file  Tobacco Use  . Smoking status: Former Smoker    Types: Cigarettes    Quit date: 1984    Years since quitting: 37.6  . Smokeless tobacco: Never Used  Vaping Use  . Vaping Use: Never used  Substance and Sexual Activity  .  Alcohol use: Yes    Comment: occ- 1-2 times a month.   . Drug use: Never  . Sexual activity: Yes    Birth control/protection: None  Other Topics Concern  . Not on file  Social History Narrative  . Not on file   Social Determinants of Health   Financial Resource Strain:   . Difficulty of Paying Living Expenses:   Food Insecurity:   . Worried About Programme researcher, broadcasting/film/video in the Last Year:   . Barista in the Last Year:   Transportation Needs:   . Freight forwarder (Medical):   Marland Kitchen Lack of Transportation (Non-Medical):   Physical Activity:   . Days of Exercise per Week:   . Minutes of Exercise per Session:   Stress:   . Feeling of Stress :   Social Connections:   . Frequency of Communication with Friends  and Family:   . Frequency of Social Gatherings with Friends and Family:   . Attends Religious Services:   . Active Member of Clubs or Organizations:   . Attends Banker Meetings:   Marland Kitchen Marital Status:   Intimate Partner Violence:   . Fear of Current or Ex-Partner:   . Emotionally Abused:   Marland Kitchen Physically Abused:   . Sexually Abused:     Review of Systems: See HPI, otherwise negative ROS  Physical Exam: BP 129/78   Pulse 99   Temp 98 F (36.7 C) (Oral)   Resp 14   Ht 5\' 6"  (1.676 m)   Wt 117.9 kg   SpO2 98%   BMI 41.97 kg/m  General:   Alert,  Well-developed, well-nourished, pleasant and cooperative in NAD Lungs:  Clear throughout to auscultation.   No wheezes, crackles, or rhonchi. No acute distress. Heart:  Regular rate and rhythm; no murmurs, clicks, rubs,  or gallops. Abdomen:  Soft, nontender and nondistended. No masses, hepatosplenomegaly or hernias noted. Normal bowel sounds, without guarding, and without rebound.   .  Impression/Plan: Belinda Turner is now here to undergo a screening colonoscopy.  First ever average  Risk screening examination Risks, benefits, limitations, imponderables and alternatives regarding colonoscopy have been reviewed with the patient. Questions have been answered. All parties agreeable.     Notice:  This dictation was prepared with Dragon dictation along with smaller phrase technology. Any transcriptional errors that result from this process are unintentional and may not be corrected upon review.

## 2020-03-01 NOTE — Transfer of Care (Signed)
Immediate Anesthesia Transfer of Care Note  Patient: Quantavia Frith  Procedure(s) Performed: COLONOSCOPY WITH PROPOFOL (N/A )  Patient Location: PACU  Anesthesia Type:General  Level of Consciousness: drowsy  Airway & Oxygen Therapy: Patient Spontanous Breathing  Post-op Assessment: Report given to RN and Post -op Vital signs reviewed and stable  Post vital signs: Reviewed and stable  Last Vitals:  Vitals Value Taken Time  BP    Temp    Pulse    Resp    SpO2      Last Pain:  Vitals:   03/01/20 1614  TempSrc:   PainSc: 0-No pain         Complications: No complications documented.

## 2020-03-05 ENCOUNTER — Encounter (HOSPITAL_COMMUNITY): Payer: Self-pay | Admitting: Internal Medicine

## 2020-03-24 ENCOUNTER — Ambulatory Visit (INDEPENDENT_AMBULATORY_CARE_PROVIDER_SITE_OTHER): Payer: PRIVATE HEALTH INSURANCE | Admitting: Family Medicine

## 2020-03-24 ENCOUNTER — Other Ambulatory Visit: Payer: Self-pay

## 2020-03-24 ENCOUNTER — Encounter: Payer: Self-pay | Admitting: Family Medicine

## 2020-03-24 VITALS — BP 143/100 | HR 86 | Temp 97.8°F | Ht 66.0 in | Wt 274.2 lb

## 2020-03-24 DIAGNOSIS — F319 Bipolar disorder, unspecified: Secondary | ICD-10-CM

## 2020-03-24 DIAGNOSIS — E039 Hypothyroidism, unspecified: Secondary | ICD-10-CM | POA: Diagnosis not present

## 2020-03-24 DIAGNOSIS — Z0000 Encounter for general adult medical examination without abnormal findings: Secondary | ICD-10-CM

## 2020-03-24 DIAGNOSIS — Z0001 Encounter for general adult medical examination with abnormal findings: Secondary | ICD-10-CM

## 2020-03-24 DIAGNOSIS — F325 Major depressive disorder, single episode, in full remission: Secondary | ICD-10-CM | POA: Diagnosis not present

## 2020-03-24 DIAGNOSIS — I10 Essential (primary) hypertension: Secondary | ICD-10-CM | POA: Insufficient documentation

## 2020-03-24 DIAGNOSIS — Z23 Encounter for immunization: Secondary | ICD-10-CM

## 2020-03-24 MED ORDER — LISINOPRIL 20 MG PO TABS: 20.0000 mg | ORAL_TABLET | Freq: Every day | ORAL | 3 refills | Status: DC

## 2020-03-24 MED ORDER — BUPROPION HCL ER (XL) 300 MG PO TB24: 300.0000 mg | ORAL_TABLET | Freq: Every day | ORAL | 3 refills | Status: DC

## 2020-03-24 MED ORDER — SHINGRIX 50 MCG/0.5ML IM SUSR: 0.5000 mL | Freq: Once | INTRAMUSCULAR | 0 refills | Status: AC

## 2020-03-24 MED ORDER — CARIPRAZINE HCL 3 MG PO CAPS: 3.0000 mg | ORAL_CAPSULE | Freq: Every day | ORAL | 3 refills | Status: DC

## 2020-03-24 MED ORDER — TRAZODONE HCL 100 MG PO TABS: 100.0000 mg | ORAL_TABLET | Freq: Every day | ORAL | 3 refills | Status: DC

## 2020-03-24 MED ORDER — HYDROXYZINE HCL 25 MG PO TABS: 25.0000 mg | ORAL_TABLET | Freq: Two times a day (BID) | ORAL | 3 refills | Status: DC

## 2020-03-24 MED ORDER — LEVOTHYROXINE SODIUM 150 MCG PO TABS: 150.0000 ug | ORAL_TABLET | Freq: Every day | ORAL | 3 refills | Status: DC

## 2020-03-24 NOTE — Progress Notes (Signed)
Assessment & Plan:  1. Well adult exam - Preventive health education provided. Mammogram to be scheduled on the bus that comes here. UTD with COVID-19 vaccines and colonoscopy. Patient declined TDAP, hepatitis C and HIV screening. Shingrix sent to pharmacy for administration after checking price through insurance. Patient will return for influenza vaccine.   2. Bipolar 1 disorder (HCC) - Well controlled on current regimen.  - cariprazine (VRAYLAR) capsule; Take 1 capsule (3 mg total) by mouth daily.  Dispense: 90 capsule; Refill: 3 - hydrOXYzine (ATARAX/VISTARIL) 25 MG tablet; Take 1 tablet (25 mg total) by mouth in the morning and at bedtime.  Dispense: 180 tablet; Refill: 3  3. Major depressive disorder with single episode, in full remission (HCC) - Well controlled on current regimen.  - buPROPion (WELLBUTRIN XL) 300 MG 24 hr tablet; Take 1 tablet (300 mg total) by mouth daily.  Dispense: 90 tablet; Refill: 3 - traZODone (DESYREL) 100 MG tablet; Take 1 tablet (100 mg total) by mouth at bedtime.  Dispense: 90 tablet; Refill: 3  4. Hypothyroidism, unspecified type - Well controlled on current regimen.  - levothyroxine (SYNTHROID) 150 MCG tablet; Take 1 tablet (150 mcg total) by mouth daily before breakfast.  Dispense: 90 tablet; Refill: 3  5. Essential hypertension - Well controlled on current regimen; she has not had her medication yet this morning. - lisinopril (ZESTRIL) 20 MG tablet; Take 1 tablet (20 mg total) by mouth daily.  Dispense: 90 tablet; Refill: 3  6. Immunization due - SHINGRIX injection; Inject 0.5 mLs into the muscle once for 1 dose.  Dispense: 0.5 mL; Refill: 0   Follow-up: Return in about 1 year (around 03/24/2021) for annual physical.   Deliah BostonBritney Panda Crossin, MSN, APRN, FNP-C [facility]  Subjective:  Patient ID: Belinda Turner, female    DOB: 1966-01-06  Age: 54 y.o. MRN: [id number]  Patient Care Team: Gwenlyn FudgeJoyce, Mahek Schlesinger F, FNP as PCP - General  (Family Medicine)   CC:  Chief Complaint  Patient presents with  . Gynecologic Exam    HPI Belinda Turner presents for her annual physical.   Occupation: at home, Marital status: married, Substance use: none Diet: regular, Exercise: walking once daily Last eye exam: 3 weeks ago Last dental exam: this week Last colonoscopy: 03/01/2020 Last mammogram: 05/06/2018 Last pap smear: h/o total hysterectomy Hepatitis C Screening: declined Immunizations: Flu Vaccine: not available yet Tdap Vaccine: declined  Shingrix Vaccine: at the pharmacy for administration, so it can be run through insurance first   COVID-19 Vaccine: up to date  DEPRESSION SCREENING PHQ 2/9 Scores 03/24/2020 10/17/2019  PHQ - 2 Score 0 2  PHQ- 9 Score 0 5     Patient's BP is elevated today but she has not had her medication yet this morning. She does check her BP at home and reports it stays 120s/80s.   Review of Systems  Constitutional: Negative for chills, fever, malaise/fatigue and weight loss.  HENT: Negative for congestion, ear discharge, ear pain, nosebleeds, sinus pain, sore throat and tinnitus.   Eyes: Negative for blurred vision, double vision, pain, discharge and redness.  Respiratory: Negative for cough, shortness of breath and wheezing.   Cardiovascular: Negative for chest pain, palpitations and leg swelling.  Gastrointestinal: Negative for abdominal pain, constipation, diarrhea, heartburn, nausea and vomiting.  Genitourinary: Negative for dysuria, frequency and urgency.  Musculoskeletal: Negative for myalgias.  Skin: Negative for rash.  Neurological: Negative for dizziness, seizures, weakness and headaches.  Psychiatric/Behavioral: Negative for depression, substance  abuse and suicidal ideas. The patient is not nervous/anxious.     Current Outpatient Medications:  .  albuterol (VENTOLIN HFA) 108 (90 Base) MCG/ACT inhaler, Inhale 2 puffs into the lungs every 6 (six) hours as needed for wheezing or  shortness of breath. , Disp: , Rfl:  .  buPROPion (WELLBUTRIN XL) 300 MG 24 hr tablet, Take 1 tablet (300 mg total) by mouth daily., Disp: 90 tablet, Rfl: 1 .  cariprazine (VRAYLAR) capsule, Take 1 capsule (3 mg total) by mouth daily., Disp: 90 capsule, Rfl: 1 .  Chromium-Cinnamon (CINNAMON PLUS CHROMIUM) [phone] MCG-MG CAPS, Take 1,000 mcg by mouth daily., Disp: , Rfl:  .  glucosamine-chondroitin 500-400 MG tablet, Take 2 tablets by mouth daily., Disp: , Rfl:  .  hydrOXYzine (ATARAX/VISTARIL) 25 MG tablet, Take 1 tablet (25 mg total) by mouth in the morning and at bedtime., Disp: 180 tablet, Rfl: 1 .  levothyroxine (SYNTHROID) 150 MCG tablet, Take 1 tablet (150 mcg total) by mouth daily before breakfast., Disp: 90 tablet, Rfl: 1 .  lisinopril (ZESTRIL) 20 MG tablet, Take 1 tablet (20 mg total) by mouth daily., Disp: 90 tablet, Rfl: 1 .  magnesium oxide (MAG-OX) 400 MG tablet, Take 400 mg by mouth daily., Disp: , Rfl:  .  Multiple Vitamin (MULTIVITAMIN) tablet, Take 1 tablet by mouth daily., Disp: , Rfl:  .  traZODone (DESYREL) 100 MG tablet, Take 1 tablet (100 mg total) by mouth at bedtime., Disp: 90 tablet, Rfl: 1  Allergies  Allergen Reactions  . Cortisone Nausea And Vomiting and Swelling  . Sulfur Nausea And Vomiting and Swelling    Past Medical History:  Diagnosis Date  . Asthma   . Bipolar 1 disorder (HCC)   . Depression   . Migraine   . Stroke [facility]) 2016   2 strokes  . Thyroid disease     Past Surgical History:  Procedure Laterality Date  . ABDOMINAL HYSTERECTOMY  2010  . CESAREAN SECTION  2005  . CESAREAN SECTION  2006  . CHOLECYSTECTOMY  2014  . COLONOSCOPY WITH PROPOFOL N/A 03/01/2020   Procedure: COLONOSCOPY WITH PROPOFOL;  Surgeon: Corbin Ade, MD;  Location: [facility];  Service: Endoscopy;  Laterality: N/A;  2:00pm  . THYROIDECTOMY  2013    Family History  Problem Relation Age of Onset  . Depression Mother   . Thyroid disease Mother   . Diabetes Father    . Thyroid disease Sister   . ADD / ADHD Son   . Autism Son   . Bipolar disorder Son   . Thyroid disease Maternal Grandmother   . Breast cancer Maternal Grandmother   . Diabetes Paternal Grandmother   . Diabetes Paternal Grandfather   . Colon cancer Neg Hx     Social History   Socioeconomic History  . Marital status: Married    Spouse name: Not on file  . Number of children: Not on file  . Years of education: Not on file  . Highest education level: Not on file  Occupational History  . Not on file  Tobacco Use  . Smoking status: Former Smoker    Types: Cigarettes    Quit date: 1984    Years since quitting: 37.6  . Smokeless tobacco: Never Used  Vaping Use  . Vaping Use: Never used  Substance and Sexual Activity  . Alcohol use: Yes    Comment: occ- 1-2 times a month.   . Drug use: Never  . Sexual activity: Yes  Birth control/protection: None  Other Topics Concern  . Not on file  Social History Narrative  . Not on file   Social Determinants of Health   Financial Resource Strain:   . Difficulty of Paying Living Expenses: Not on file  Food Insecurity:   . Worried About Programme researcher, broadcasting/film/video in the Last Year: Not on file  . Ran Out of Food in the Last Year: Not on file  Transportation Needs:   . Lack of Transportation (Medical): Not on file  . Lack of Transportation (Non-Medical): Not on file  Physical Activity:   . Days of Exercise per Week: Not on file  . Minutes of Exercise per Session: Not on file  Stress:   . Feeling of Stress : Not on file  Social Connections:   . Frequency of Communication with Friends and Family: Not on file  . Frequency of Social Gatherings with Friends and Family: Not on file  . Attends Religious Services: Not on file  . Active Member of Clubs or Organizations: Not on file  . Attends Banker Meetings: Not on file  . Marital Status: Not on file  Intimate Partner Violence:   . Fear of Current or Ex-Partner: Not on file    . Emotionally Abused: Not on file  . Physically Abused: Not on file  . Sexually Abused: Not on file      Objective:    BP (!) 143/100   Pulse 86   Temp 97.8 F (36.6 C) (Temporal)   Ht 5\' 6"  (1.676 m)   Wt 274 lb 3.2 oz (124.4 kg)   SpO2 94%   BMI 44.26 kg/m   Wt Readings from Last 3 Encounters:  03/24/20 274 lb 3.2 oz (124.4 kg)  03/01/20 260 lb (117.9 kg)  10/17/19 246 lb 12.8 oz (111.9 kg)    Physical Exam Vitals reviewed.  Constitutional:      General: She is not in acute distress.    Appearance: Normal appearance. She is morbidly obese. She is not ill-appearing, toxic-appearing or diaphoretic.  HENT:     Head: Normocephalic and atraumatic.     Right Ear: Tympanic membrane, ear canal and external ear normal. There is no impacted cerumen.     Left Ear: Tympanic membrane, ear canal and external ear normal. There is no impacted cerumen.     Nose: Nose normal. No congestion or rhinorrhea.     Mouth/Throat:     Mouth: Mucous membranes are moist.     Pharynx: Oropharynx is clear. No oropharyngeal exudate or posterior oropharyngeal erythema.  Eyes:     General: No scleral icterus.       Right eye: No discharge.        Left eye: No discharge.     Conjunctiva/sclera: Conjunctivae normal.     Pupils: Pupils are equal, round, and reactive to light.  Cardiovascular:     Rate and Rhythm: Normal rate and regular rhythm.     Heart sounds: Normal heart sounds. No murmur heard.  No friction rub. No gallop.   Pulmonary:     Effort: Pulmonary effort is normal. No respiratory distress.     Breath sounds: Normal breath sounds. No stridor. No wheezing, rhonchi or rales.  Abdominal:     General: Abdomen is flat. Bowel sounds are normal. There is no distension.     Palpations: Abdomen is soft. There is no mass.     Tenderness: There is no abdominal tenderness. There is no guarding or  rebound.     Hernia: No hernia is present.  Musculoskeletal:        General: Normal range of  motion.     Cervical back: Normal range of motion and neck supple. No rigidity. No muscular tenderness.  Lymphadenopathy:     Cervical: No cervical adenopathy.  Skin:    General: Skin is warm and dry.     Capillary Refill: Capillary refill takes less than 2 seconds.  Neurological:     General: No focal deficit present.     Mental Status: She is alert and oriented to person, place, and time. Mental status is at baseline.  Psychiatric:        Mood and Affect: Mood normal.        Behavior: Behavior normal.        Thought Content: Thought content normal.        Judgment: Judgment normal.     Lab Results  Component Value Date   TSH 3.290 10/17/2019   Lab Results  Component Value Date   WBC 6.7 10/17/2019   HGB 13.3 10/17/2019   HCT 41.6 10/17/2019   MCV 86 10/17/2019   PLT 247 10/17/2019   Lab Results  Component Value Date   NA 141 10/17/2019   K 4.3 10/17/2019   CO2 25 10/17/2019   GLUCOSE 89 10/17/2019   BUN 13 10/17/2019   CREATININE 1.05 (H) 10/17/2019   BILITOT 0.4 10/17/2019   ALKPHOS 97 10/17/2019   AST 16 10/17/2019   ALT 11 10/17/2019   PROT 6.2 10/17/2019   ALBUMIN 4.2 10/17/2019   CALCIUM 9.4 10/17/2019   Lab Results  Component Value Date   CHOL 137 10/17/2019   Lab Results  Component Value Date   HDL 50 10/17/2019   Lab Results  Component Value Date   LDLCALC 70 10/17/2019   Lab Results  Component Value Date   TRIG 91 10/17/2019   Lab Results  Component Value Date   CHOLHDL 2.7 10/17/2019   No results found for: HGBA1C

## 2020-03-24 NOTE — Patient Instructions (Signed)

## 2020-03-25 ENCOUNTER — Other Ambulatory Visit: Payer: Self-pay | Admitting: Family Medicine

## 2020-03-25 DIAGNOSIS — Z1231 Encounter for screening mammogram for malignant neoplasm of breast: Secondary | ICD-10-CM

## 2020-05-10 ENCOUNTER — Other Ambulatory Visit: Payer: Self-pay

## 2020-05-10 ENCOUNTER — Encounter: Payer: Self-pay | Admitting: Nurse Practitioner

## 2020-05-10 ENCOUNTER — Ambulatory Visit (INDEPENDENT_AMBULATORY_CARE_PROVIDER_SITE_OTHER): Payer: PRIVATE HEALTH INSURANCE | Admitting: Nurse Practitioner

## 2020-05-10 VITALS — BP 143/77 | HR 98 | Temp 97.3°F | Ht 66.0 in | Wt 269.0 lb

## 2020-05-10 DIAGNOSIS — R5383 Other fatigue: Secondary | ICD-10-CM | POA: Diagnosis not present

## 2020-05-10 DIAGNOSIS — R202 Paresthesia of skin: Secondary | ICD-10-CM

## 2020-05-10 NOTE — Patient Instructions (Signed)
Hypersomnia Hypersomnia is a condition in which a person feels very tired during the day even though he or she gets plenty of sleep at night. A person with this condition may take naps during the day and may find it very difficult to wake up from sleep. Hypersomnia may affect a person's ability to think, concentrate, drive, or remember things. What are the causes? The cause of this condition may not be known. Possible causes include:  Certain medicines.  Sleep disorders, such as narcolepsy and sleep apnea.  Injury to the head, brain, or spinal cord.  Drug or alcohol use.  Gastroesophageal reflux disease (GERD).  Tumors.  Certain medical conditions, such as depression, diabetes, or an underactive thyroid gland (hypothyroidism). What are the signs or symptoms? The main symptoms of hypersomnia include:  Feeling very tired throughout the day, regardless of how much sleep you got the night before.  Having trouble waking up. Others may find it difficult to wake you up when you are sleeping.  Sleeping for longer and longer periods at a time.  Taking naps throughout the day. Other symptoms may include:  Feeling restless, anxious, or annoyed.  Lacking energy.  Having trouble with: ? Remembering. ? Speaking. ? Thinking.  Loss of appetite.  Seeing, hearing, tasting, smelling, or feeling things that are not real (hallucinations). How is this diagnosed? This condition may be diagnosed based on:  Your symptoms and medical history.  Your sleeping habits. Your health care provider may ask you to write down your sleeping habits in a daily sleep log, along with any symptoms you have.  A series of tests that are done while you sleep (sleep study or polysomnogram).  A test that measures how quickly you can fall asleep during the day (daytime nap study or multiple sleep latency test). How is this treated? Treatment can help you manage your condition. Treatment may  include:  Following a regular sleep routine.  Lifestyle changes, such as changing your eating habits, getting regular exercise, and avoiding alcohol or caffeinated beverages.  Taking medicines to make you more alert (stimulants) during the day.  Treating any underlying medical causes of hypersomnia. Follow these instructions at home: Sleep routine   Schedule the same bedtime and wake-up time each day.  Practice a relaxing bedtime routine. This may include reading, meditation, deep breathing, or taking a warm bath before going to sleep.  Get regular exercise each day. Avoid strenuous exercise in the evening hours.  Keep your sleep environment at a cooler temperature, darkened, and quiet.  Sleep with pillows and a mattress that are comfortable and supportive.  Schedule short 20-minute naps for when you feel sleepiest during the day.  Talk with your employer or teachers about your hypersomnia. If possible, adjust your schedule so that: ? You have a regular daytime work schedule. ? You can take a scheduled nap during the day. ? You do not have to work or be active at night.  Do not eat a heavy meal for a few hours before bedtime. Eat your meals at about the same times every day.  Avoid drinking alcohol or caffeinated beverages. Safety   Do not drive or use heavy machinery if you are sleepy. Ask your health care provider if it is safe for you to drive.  Wear a life jacket when swimming or spending time near water. General instructions  Take supplements and over-the-counter and prescription medicines only as told by your health care provider.  Keep a sleep log that will help  your doctor manage your condition. This may include information about: ? What time you go to bed each night. ? How often you wake up at night. ? How many hours you sleep at night. ? How often and for how long you nap during the day. ? Any observations from others, such as leg movements during sleep,  sleep walking, or snoring.  Keep all follow-up visits as told by your health care provider. This is important. Contact a health care provider if:  You have new symptoms.  Your symptoms get worse. Get help right away if:  You have serious thoughts about hurting yourself or someone else. If you ever feel like you may hurt yourself or others, or have thoughts about taking your own life, get help right away. You can go to your nearest emergency department or call:  Your local emergency services (911 in the [place]  A suicide crisis helpline, such as the [place] at 914-492-5783. This is open 24 hours a day. Summary  Hypersomnia refers to a condition in which you feel very tired during the day even though you get plenty of sleep at night.  A person with this condition may take naps during the day and may find it very difficult to wake up from sleep.  Hypersomnia may affect a person's ability to think, concentrate, drive, or remember things.  Treatment, such as following a regular sleep routine and making some lifestyle changes, can help you manage your condition. This information is not intended to replace advice given to you by your health care provider. Make sure you discuss any questions you have with your health care provider. Document Revised: 07/12/2017 Document Reviewed: 07/12/2017 Elsevier Patient Education  [place]. Fatigue If you have fatigue, you feel tired all the time and have a lack of energy or a lack of motivation. Fatigue may make it difficult to start or complete tasks because of exhaustion. In general, occasional or mild fatigue is often a normal response to activity or life. However, long-lasting (chronic) or extreme fatigue may be a symptom of a medical condition. Follow these instructions at home: General instructions  Watch your fatigue for any changes.  Go to bed and get up at the same time every day.  Avoid fatigue by  pacing yourself during the day and getting enough sleep at night.  Maintain a healthy weight. Medicines  Take over-the-counter and prescription medicines only as told by your health care provider.  Take a multivitamin, if told by your health care provider.  Do not use herbal or dietary supplements unless they are approved by your health care provider. Activity   Exercise regularly, as told by your health care provider.  Use or practice techniques to help you relax, such as yoga, tai chi, meditation, or massage therapy. Eating and drinking   Avoid heavy meals in the evening.  Eat a well-balanced diet, which includes lean proteins, whole grains, plenty of fruits and vegetables, and low-fat dairy products.  Avoid consuming too much caffeine.  Avoid the use of alcohol.  Drink enough fluid to keep your urine pale yellow. Lifestyle  Change situations that cause you stress. Try to keep your work and personal schedule in balance.  Do not use any products that contain nicotine or tobacco, such as cigarettes and e-cigarettes. If you need help quitting, ask your health care provider.  Do not use drugs. Contact a health care provider if:  Your fatigue does not get better.  You have  a fever.  You suddenly lose or gain weight.  You have headaches.  You have trouble falling asleep or sleeping through the night.  You feel angry, guilty, anxious, or sad.  You are unable to have a bowel movement (constipation).  Your skin is dry.  You have swelling in your legs or another part of your body. Get help right away if:  You feel confused.  Your vision is blurry.  You feel faint or you pass out.  You have a severe headache.  You have severe pain in your abdomen, your back, or the area between your waist and hips (pelvis).  You have chest pain, shortness of breath, or an irregular or fast heartbeat.  You are unable to urinate, or you urinate less than normal.  You have  abnormal bleeding, such as bleeding from the rectum, vagina, nose, lungs, or nipples.  You vomit blood.  You have thoughts about hurting yourself or others. If you ever feel like you may hurt yourself or others, or have thoughts about taking your own life, get help right away. You can go to your nearest emergency department or call:  Your local emergency services (911 in the [place]  A suicide crisis helpline, such as the [place] at 339-873-0802. This is open 24 hours a day. Summary  If you have fatigue, you feel tired all the time and have a lack of energy or a lack of motivation.  Fatigue may make it difficult to start or complete tasks because of exhaustion.  Long-lasting (chronic) or extreme fatigue may be a symptom of a medical condition.  Exercise regularly, as told by your health care provider.  Change situations that cause you stress. Try to keep your work and personal schedule in balance. This information is not intended to replace advice given to you by your health care provider. Make sure you discuss any questions you have with your health care provider. Document Revised: 01/29/2019 Document Reviewed: 04/04/2017 Elsevier Patient Education  2020 [employer].

## 2020-05-10 NOTE — Assessment & Plan Note (Signed)
Patient is a 54 year old female who presents to clinic for follow-up depression.  Patient is well controlled on current medication.  Completed PHQ-9 score is a 5.  No changes to current medication dose.  Provided education to patient with printed handouts given.

## 2020-05-10 NOTE — Progress Notes (Signed)
Acute Office Visit  Subjective:    Patient ID: Belinda Turner, female    DOB: 05/03/1966, 54 y.o.   MRN: [id number]  Chief Complaint  Patient presents with  . Fatigue    x 1 mth. Patient states she has not been doing anything diffrent, no new medicationa     HPI Patient is a 54 year old female who presents to clinic for fatigue.  This is new for patient in the last 1 month.  Patient has no other signs or symptoms.  She reports excessive sleep during the daytime.  And has lost at least 5 pounds in the last month. Patient has not made any changes to her diet or daily activities.   Depression: Patient complains of depression. She complains of depressed mood and fatigue. Onset was approximately a few years ago, gradually improving since that time.  She denies current suicidal and homicidal plan or intent.   Family history significant for no psychiatric illness.Possible organic causes contributing are: none.  Risk factors: previous episode of depression Previous treatment includes Wellbutrin. She complains of the following side effects from the treatment: none.  Past Medical History:  Diagnosis Date  . Asthma   . Bipolar 1 disorder (HCC)   . Depression   . Hypertension   . Migraine   . Stroke [facility]) 2016   2 strokes  . Thyroid disease     Past Surgical History:  Procedure Laterality Date  . ABDOMINAL HYSTERECTOMY  2010  . CESAREAN SECTION  2005  . CESAREAN SECTION  2006  . CHOLECYSTECTOMY  2014  . COLONOSCOPY WITH PROPOFOL N/A 03/01/2020   Procedure: COLONOSCOPY WITH PROPOFOL;  Surgeon: Corbin Ade, MD;  Location: [facility];  Service: Endoscopy;  Laterality: N/A;  2:00pm  . THYROIDECTOMY  2013    Family History  Problem Relation Age of Onset  . Depression Mother   . Thyroid disease Mother   . Diabetes Father   . Thyroid disease Sister   . ADD / ADHD Son   . Autism Son   . Bipolar disorder Son   . Thyroid disease Maternal Grandmother   . Breast cancer  Maternal Grandmother   . Diabetes Paternal Grandmother   . Diabetes Paternal Grandfather   . Colon cancer Neg Hx       Outpatient Medications Prior to Visit  Medication Sig Dispense Refill  . albuterol (VENTOLIN HFA) 108 (90 Base) MCG/ACT inhaler Inhale 2 puffs into the lungs every 6 (six) hours as needed for wheezing or shortness of breath.     Marland Kitchen buPROPion (WELLBUTRIN XL) 300 MG 24 hr tablet Take 1 tablet (300 mg total) by mouth daily. 90 tablet 3  . cariprazine (VRAYLAR) capsule Take 1 capsule (3 mg total) by mouth daily. 90 capsule 3  . Chromium-Cinnamon (CINNAMON PLUS CHROMIUM) [phone] MCG-MG CAPS Take 1,000 mcg by mouth daily.    Marland Kitchen glucosamine-chondroitin 500-400 MG tablet Take 2 tablets by mouth daily.    . hydrOXYzine (ATARAX/VISTARIL) 25 MG tablet Take 1 tablet (25 mg total) by mouth in the morning and at bedtime. 180 tablet 3  . levothyroxine (SYNTHROID) 150 MCG tablet Take 1 tablet (150 mcg total) by mouth daily before breakfast. 90 tablet 3  . lisinopril (ZESTRIL) 20 MG tablet Take 1 tablet (20 mg total) by mouth daily. 90 tablet 3  . magnesium oxide (MAG-OX) 400 MG tablet Take 400 mg by mouth daily.    . Multiple Vitamin (MULTIVITAMIN) tablet Take 1 tablet by mouth daily.    Marland Kitchen  traZODone (DESYREL) 100 MG tablet Take 1 tablet (100 mg total) by mouth at bedtime. 90 tablet 3   No facility-administered medications prior to visit.     Office Visit from 05/10/2020 in [facility]  PHQ-9 Total Score 5     GAD 7 : Generalized Anxiety Score 05/10/2020 03/24/2020 10/17/2019  Nervous, Anxious, on Edge 1 0 1  Control/stop worrying 0 0 0  Worry too much - different things 0 0 1  Trouble relaxing 0 0 0  Restless 0 0 0  Easily annoyed or irritable 1 1 1   Afraid - awful might happen 0 0 0  Total GAD 7 Score 2 1 3   Anxiety Difficulty Somewhat difficult - Not difficult at all    Allergies  Allergen Reactions  . Cortisone Nausea And Vomiting and Swelling  .  Sulfur Nausea And Vomiting and Swelling    Review of Systems  Neurological:       Hypersomnia, paresthesia  All other systems reviewed and are negative.      Objective:    Physical Exam Vitals reviewed.  Constitutional:      Appearance: Normal appearance.  HENT:     Head: Normocephalic.     Nose: Nose normal.  Eyes:     Conjunctiva/sclera: Conjunctivae normal.  Cardiovascular:     Rate and Rhythm: Normal rate and regular rhythm.     Pulses: Normal pulses.     Heart sounds: Normal heart sounds.  Pulmonary:     Effort: Pulmonary effort is normal.     Breath sounds: Normal breath sounds.  Abdominal:     General: Bowel sounds are normal.  Musculoskeletal:        General: Normal range of motion.  Skin:    General: Skin is warm.  Neurological:     Mental Status: She is alert and oriented to person, place, and time.     Comments: Tingling, paresthesia bilateral upper extremity.  Psychiatric:        Mood and Affect: Mood normal.        Behavior: Behavior normal.     BP (!) 143/77   Pulse 98   Temp (!) 97.3 F (36.3 C) (Temporal)   Ht 5\' 6"  (1.676 m)   Wt 269 lb (122 kg)   SpO2 95%   BMI 43.42 kg/m  Wt Readings from Last 3 Encounters:  05/10/20 269 lb (122 kg)  03/24/20 274 lb 3.2 oz (124.4 kg)  03/01/20 260 lb (117.9 kg)    Health Maintenance Due  Topic Date Due  . INFLUENZA VACCINE  02/22/2020  . MAMMOGRAM  05/06/2020    There are no preventive care reminders to display for this patient.   Lab Results  Component Value Date   TSH 3.290 10/17/2019   Lab Results  Component Value Date   WBC 6.7 10/17/2019   HGB 13.3 10/17/2019   HCT 41.6 10/17/2019   MCV 86 10/17/2019   PLT 247 10/17/2019   Lab Results  Component Value Date   NA 141 10/17/2019   K 4.3 10/17/2019   CO2 25 10/17/2019   GLUCOSE 89 10/17/2019   BUN 13 10/17/2019   CREATININE 1.05 (H) 10/17/2019   BILITOT 0.4 10/17/2019   ALKPHOS 97 10/17/2019   AST 16 10/17/2019   ALT 11  10/17/2019   PROT 6.2 10/17/2019   ALBUMIN 4.2 10/17/2019   CALCIUM 9.4 10/17/2019   Lab Results  Component Value Date   CHOL 137 10/17/2019   Lab  Results  Component Value Date   HDL 50 10/17/2019   Lab Results  Component Value Date   LDLCALC 70 10/17/2019   Lab Results  Component Value Date   TRIG 91 10/17/2019   Lab Results  Component Value Date   CHOLHDL 2.7 10/17/2019   No results found for: HGBA1C     Assessment & Plan:   Problem List Items Addressed This Visit      Other   Other fatigue    Patient is reporting fatigue that is now well controlled.  Symptoms started a month ago.  Patient is unable to state her signs and symptoms.  Patient reports excess sleep.  With no changes to lifestyle.  Patient has a history of depression.  Provided education to patient with printed handouts given.  B12 labs completed results pending.   Follow-up with worsening or unresolved symptoms.      Relevant Orders   B12 and Folate Panel   Paresthesia - Primary    Seizure is new for patient and not well controlled in the last 1 month. B12 labs completed results pending.  Education provided to patient which printed handouts given. Follow-up with worsening or unresolved symptoms.       Relevant Orders   B12 and Folate Panel        Daryll Drown, NP

## 2020-05-10 NOTE — Assessment & Plan Note (Signed)
Patient is reporting fatigue that is now well controlled.  Symptoms started a month ago.  Patient is unable to state her signs and symptoms.  Patient reports excess sleep.  With no changes to lifestyle.  Patient has a history of depression.  Provided education to patient with printed handouts given.  B12 labs completed results pending.   Follow-up with worsening or unresolved symptoms.

## 2020-05-10 NOTE — Assessment & Plan Note (Signed)
Seizure is new for patient and not well controlled in the last 1 month. B12 labs completed results pending.  Education provided to patient which printed handouts given. Follow-up with worsening or unresolved symptoms.

## 2020-05-11 LAB — B12 AND FOLATE PANEL
Folate: 13.5 ng/mL (ref >3.0)
Vitamin B-12: 440 pg/mL (ref 232–1245)

## 2020-05-24 ENCOUNTER — Other Ambulatory Visit: Payer: Self-pay

## 2020-05-24 ENCOUNTER — Ambulatory Visit (INDEPENDENT_AMBULATORY_CARE_PROVIDER_SITE_OTHER): Payer: PRIVATE HEALTH INSURANCE

## 2020-05-24 DIAGNOSIS — Z23 Encounter for immunization: Secondary | ICD-10-CM | POA: Diagnosis not present

## 2020-09-17 ENCOUNTER — Telehealth: Payer: Self-pay | Admitting: *Deleted

## 2020-09-17 NOTE — Telephone Encounter (Signed)
PA in process (Key: [id number]) Vraylar 3MG  capsules

## 2020-09-20 NOTE — Telephone Encounter (Signed)
This medication or product was previously approved on [id number] from 10/17/2019 to 10/16/2021. You will be able to fill a prescription for this medication at your pharmacy. If your pharmacy has questions regarding the processing of your prescription, please have them call the [facility] pharmacy help desk at [phone]

## 2020-09-21 ENCOUNTER — Other Ambulatory Visit: Payer: Self-pay

## 2020-09-21 ENCOUNTER — Ambulatory Visit (INDEPENDENT_AMBULATORY_CARE_PROVIDER_SITE_OTHER): Payer: PRIVATE HEALTH INSURANCE

## 2020-09-21 ENCOUNTER — Ambulatory Visit (INDEPENDENT_AMBULATORY_CARE_PROVIDER_SITE_OTHER): Payer: PRIVATE HEALTH INSURANCE | Admitting: Family Medicine

## 2020-09-21 ENCOUNTER — Encounter: Payer: Self-pay | Admitting: Family Medicine

## 2020-09-21 VITALS — BP 120/75 | HR 86 | Ht 66.0 in | Wt 272.4 lb

## 2020-09-21 DIAGNOSIS — M25561 Pain in right knee: Secondary | ICD-10-CM

## 2020-09-21 DIAGNOSIS — M1711 Unilateral primary osteoarthritis, right knee: Secondary | ICD-10-CM

## 2020-09-21 DIAGNOSIS — R6883 Chills (without fever): Secondary | ICD-10-CM

## 2020-09-21 MED ORDER — DICLOFENAC SODIUM 1 % EX GEL: 2.0000 g | Freq: Four times a day (QID) | CUTANEOUS | 3 refills | Status: DC

## 2020-09-21 NOTE — Progress Notes (Signed)
BP 120/75   Pulse 86   Ht 5\' 6"  (1.676 m)   Wt 272 lb 6 oz (123.5 kg)   SpO2 97%   BMI 43.96 kg/m    Subjective:   Patient ID: , female    DOB: Jul 16, 1966, 55 y.o.   MRN: [id number]  HPI: Belinda Turner is a 55 y.o. female presenting on 09/21/2020 for Knee Pain   HPI Patient comes in complaining of right medial knee pain that is been hurting her over the past couple months, she says it is swollen and hurts her at night and through the day she does not feel like it is red or hot to touch.  She denies any popping or catching or giving way.  She says also over the past 2 months she gets these occasional chills no sugar through her whole body and cause her to shake and last a few seconds and then pass, happens every day sometimes multiple times a day.  She denies any fevers or constipation or diarrhea or dysuria or body aches or cough or congestion.  She has not tried anything over-the-counter except Tylenol and it did not help.  Relevant past medical, surgical, family and social history reviewed and updated as indicated. Interim medical history since our last visit reviewed. Allergies and medications reviewed and updated.  Review of Systems  Constitutional: Positive for chills. Negative for fever.  Eyes: Negative for redness and visual disturbance.  Respiratory: Negative for chest tightness and shortness of breath.   Cardiovascular: Negative for chest pain and leg swelling.  Gastrointestinal: Negative for abdominal pain, constipation, diarrhea, nausea and vomiting.  Genitourinary: Negative for difficulty urinating, dysuria and frequency.  Musculoskeletal: Positive for arthralgias and joint swelling. Negative for back pain and gait problem.  Skin: Negative for rash.  Neurological: Positive for tremors. Negative for dizziness, weakness, light-headedness, numbness and headaches.  Psychiatric/Behavioral: Negative for agitation and behavioral problems.  All other  systems reviewed and are negative.   Per HPI unless specifically indicated above   Allergies as of 09/21/2020      Reactions   Cortisone Nausea And Vomiting, Swelling   Elemental Sulfur Nausea And Vomiting, Swelling      Medication List       Accurate as of September 21, 2020  8:56 AM. If you have any questions, ask your nurse or doctor.        albuterol 108 (90 Base) MCG/ACT inhaler Commonly known as: VENTOLIN HFA Inhale 2 puffs into the lungs every 6 (six) hours as needed for wheezing or shortness of breath.   buPROPion 300 MG 24 hr tablet Commonly known as: WELLBUTRIN XL Take 1 tablet (300 mg total) by mouth daily.   cariprazine capsule Commonly known as: Vraylar Take 1 capsule (3 mg total) by mouth daily.   Cinnamon Plus Chromium [phone] MCG-MG Caps Generic drug: Chromium-Cinnamon Take 1,000 mcg by mouth daily.   glucosamine-chondroitin 500-400 MG tablet Take 2 tablets by mouth daily.   hydrOXYzine 25 MG tablet Commonly known as: ATARAX/VISTARIL Take 1 tablet (25 mg total) by mouth in the morning and at bedtime.   levothyroxine 150 MCG tablet Commonly known as: SYNTHROID Take 1 tablet (150 mcg total) by mouth daily before breakfast.   lisinopril 20 MG tablet Commonly known as: ZESTRIL Take 1 tablet (20 mg total) by mouth daily.   magnesium oxide 400 MG tablet Commonly known as: MAG-OX Take 400 mg by mouth daily.   multivitamin tablet Take 1 tablet by  mouth daily.   traZODone 100 MG tablet Commonly known as: DESYREL Take 1 tablet (100 mg total) by mouth at bedtime.        Objective:   BP 120/75   Pulse 86   Ht 5\' 6"  (1.676 m)   Wt 272 lb 6 oz (123.5 kg)   SpO2 97%   BMI 43.96 kg/m   Wt Readings from Last 3 Encounters:  09/21/20 272 lb 6 oz (123.5 kg)  05/10/20 269 lb (122 kg)  03/24/20 274 lb 3.2 oz (124.4 kg)    Physical Exam Vitals and nursing note reviewed.  Constitutional:      General: She is not in acute distress.    Appearance:  She is well-developed and well-nourished. She is not diaphoretic.  Eyes:     Extraocular Movements: EOM normal.     Conjunctiva/sclera: Conjunctivae normal.  Cardiovascular:     Rate and Rhythm: Normal rate and regular rhythm.     Pulses: Intact distal pulses.     Heart sounds: Normal heart sounds. No murmur heard.   Pulmonary:     Effort: Pulmonary effort is normal. No respiratory distress.     Breath sounds: Normal breath sounds. No wheezing.  Abdominal:     General: Abdomen is flat. Bowel sounds are normal. There is no distension.     Tenderness: There is no abdominal tenderness. There is no guarding or rebound.  Musculoskeletal:        General: No edema. Normal range of motion.     Right knee: Effusion (Small amount) present. No swelling, erythema, bony tenderness or crepitus. Normal range of motion. Tenderness present over the medial joint line. No LCL laxity, MCL laxity, ACL laxity or PCL laxity.     Instability Tests: Anterior drawer test negative. Posterior drawer test negative. Anterior Lachman test negative. Medial McMurray test negative and lateral McMurray test negative.  Skin:    General: Skin is warm and dry.     Findings: No rash.  Neurological:     Mental Status: She is alert and oriented to person, place, and time.     Coordination: Coordination normal.  Psychiatric:        Mood and Affect: Mood and affect normal.        Behavior: Behavior normal.     Right knee x-ray: Right medial compartmental narrowing, almost bone-on-bone, await final read from radiology  Assessment & Plan:   Problem List Items Addressed This Visit   None   Visit Diagnoses    Primary osteoarthritis of right knee    -  Primary   Relevant Medications   diclofenac Sodium (VOLTAREN) 1 % GEL   Chills (without fever)       Acute pain of right knee       Relevant Medications   diclofenac Sodium (VOLTAREN) 1 % GEL   Other Relevant Orders   DG Knee 1-2 Views Right    Instructed patient  that we will try Voltaren gel for significant tooth inflammation, if that does not work then she may want to return for something stronger or injections.  Follow up plan: Return if symptoms worsen or fail to improve.  Counseling provided for all of the vaccine components No orders of the defined types were placed in this encounter.   05/24/20, MD [facility] 09/21/2020, 8:56 AM

## 2020-10-05 ENCOUNTER — Other Ambulatory Visit: Payer: Self-pay

## 2020-10-05 ENCOUNTER — Ambulatory Visit
Admission: RE | Admit: 2020-10-05 | Discharge: 2020-10-05 | Disposition: A | Discharge status: Home / Self Care | Payer: PRIVATE HEALTH INSURANCE | Source: Ambulatory Visit | Attending: Family Medicine | Admitting: Family Medicine

## 2020-10-05 DIAGNOSIS — Z1231 Encounter for screening mammogram for malignant neoplasm of breast: Secondary | ICD-10-CM

## 2020-10-15 ENCOUNTER — Ambulatory Visit (INDEPENDENT_AMBULATORY_CARE_PROVIDER_SITE_OTHER): Payer: PRIVATE HEALTH INSURANCE | Admitting: Family Medicine

## 2020-10-15 ENCOUNTER — Other Ambulatory Visit: Payer: Self-pay

## 2020-10-15 ENCOUNTER — Encounter: Payer: Self-pay | Admitting: Family Medicine

## 2020-10-15 VITALS — BP 143/107 | HR 98 | Temp 98.1°F | Ht 66.0 in | Wt 279.4 lb

## 2020-10-15 DIAGNOSIS — G252 Other specified forms of tremor: Secondary | ICD-10-CM

## 2020-10-15 NOTE — Patient Instructions (Signed)

## 2020-10-15 NOTE — Progress Notes (Signed)
Assessment & Plan:  1. Resting tremor - Ambulatory referral to Neurology   Follow up plan: Return if symptoms worsen or fail to improve.  Deliah Boston, MSN, APRN, FNP-C [facility]  Subjective:   Patient ID: Belinda Turner, female    DOB: [birth year], 55 y.o.   MRN: [id number]  HPI: Belinda Turner is a 56 y.o. female presenting on 10/15/2020 for Tremors (X 3-4 months on and off.  Patient states it is happening more now. )  Patient reports a resting tremor for the past 3-4 months that is getting worse. The tremor stops with action.    ROS: Negative unless specifically indicated above in HPI.   Relevant past medical history reviewed and updated as indicated.   Allergies and medications reviewed and updated.   Current Outpatient Medications:  .  albuterol (VENTOLIN HFA) 108 (90 Base) MCG/ACT inhaler, Inhale 2 puffs into the lungs every 6 (six) hours as needed for wheezing or shortness of breath. , Disp: , Rfl:  .  buPROPion (WELLBUTRIN XL) 300 MG 24 hr tablet, Take 1 tablet (300 mg total) by mouth daily., Disp: 90 tablet, Rfl: 3 .  cariprazine (VRAYLAR) capsule, Take 1 capsule (3 mg total) by mouth daily., Disp: 90 capsule, Rfl: 3 .  Chromium-Cinnamon (CINNAMON PLUS CHROMIUM) [phone] MCG-MG CAPS, Take 1,000 mcg by mouth daily., Disp: , Rfl:  .  diclofenac Sodium (VOLTAREN) 1 % GEL, Apply 2 g topically 4 (four) times daily., Disp: 350 g, Rfl: 3 .  glucosamine-chondroitin 500-400 MG tablet, Take 2 tablets by mouth daily., Disp: , Rfl:  .  hydrOXYzine (ATARAX/VISTARIL) 25 MG tablet, Take 1 tablet (25 mg total) by mouth in the morning and at bedtime., Disp: 180 tablet, Rfl: 3 .  levothyroxine (SYNTHROID) 150 MCG tablet, Take 1 tablet (150 mcg total) by mouth daily before breakfast., Disp: 90 tablet, Rfl: 3 .  lisinopril (ZESTRIL) 20 MG tablet, Take 1 tablet (20 mg total) by mouth daily., Disp: 90 tablet, Rfl: 3 .  magnesium oxide (MAG-OX) 400 MG  tablet, Take 400 mg by mouth daily., Disp: , Rfl:  .  Multiple Vitamin (MULTIVITAMIN) tablet, Take 1 tablet by mouth daily., Disp: , Rfl:  .  traZODone (DESYREL) 100 MG tablet, Take 1 tablet (100 mg total) by mouth at bedtime., Disp: 90 tablet, Rfl: 3  Allergies  Allergen Reactions  . Cortisone Nausea And Vomiting and Swelling  . Elemental Sulfur Nausea And Vomiting and Swelling    Objective:   BP (!) 143/107   Pulse 98   Temp 98.1 F (36.7 C) (Temporal)   Ht 5\' 6"  (1.676 m)   Wt 279 lb 6.4 oz (126.7 kg)   SpO2 95%   BMI 45.10 kg/m    Physical Exam Vitals reviewed.  Constitutional:      General: She is not in acute distress.    Appearance: Normal appearance. She is not ill-appearing, toxic-appearing or diaphoretic.  HENT:     Head: Normocephalic and atraumatic.  Eyes:     General: No scleral icterus.       Right eye: No discharge.        Left eye: No discharge.     Conjunctiva/sclera: Conjunctivae normal.  Cardiovascular:     Rate and Rhythm: Normal rate.  Pulmonary:     Effort: Pulmonary effort is normal. No respiratory distress.  Musculoskeletal:        General: Normal range of motion.     Cervical back: Normal range of  motion.  Skin:    General: Skin is warm and dry.     Capillary Refill: Capillary refill takes less than 2 seconds.  Neurological:     General: No focal deficit present.     Mental Status: She is alert and oriented to person, place, and time. Mental status is at baseline.     Motor: Tremor (resting) present.  Psychiatric:        Mood and Affect: Mood normal.        Behavior: Behavior normal.        Thought Content: Thought content normal.        Judgment: Judgment normal.

## 2020-10-20 ENCOUNTER — Encounter: Payer: Self-pay | Admitting: Family Medicine

## 2020-12-27 ENCOUNTER — Ambulatory Visit: Payer: PRIVATE HEALTH INSURANCE | Admitting: Neurology

## 2021-01-13 ENCOUNTER — Ambulatory Visit (INDEPENDENT_AMBULATORY_CARE_PROVIDER_SITE_OTHER): Payer: No Typology Code available for payment source | Admitting: Neurology

## 2021-01-13 ENCOUNTER — Encounter: Payer: Self-pay | Admitting: Neurology

## 2021-01-13 VITALS — BP 122/82 | HR 98 | Ht 66.0 in | Wt 272.0 lb

## 2021-01-13 DIAGNOSIS — G2119 Other drug induced secondary parkinsonism: Secondary | ICD-10-CM

## 2021-01-13 DIAGNOSIS — R251 Tremor, unspecified: Secondary | ICD-10-CM

## 2021-01-13 NOTE — Patient Instructions (Addendum)
It was nice to meet you today.  Your hand tremor as well is changes in your fine motor control particularly in the left hand suspicious for mild parkinsonism.  You also have some milder changes in your gait.  I believe you have developed parkinsonian features from taking Vraylar for years.  I would recommend that you talk to Shon Hale, NP about potentially tapering off of the Vraylar if possible.  While it is not impossible that you have developed "true" Parkinson's disease.  If you are able to taper off the medication, I would like to reevaluate you in about 6 months.   Your neurological exam is not telltale for essential tremor, a type of familial tremor.   Please remember, that any kind of tremor may be exacerbated by anxiety, anger, nervousness, excitement, dehydration, sleep deprivation, thyroid dysfunction, by caffeine, and low blood sugar values or blood sugar fluctuations. Some medications, especially some antidepressants and lithium can cause or exacerbate tremors.  Your thyroid screening test called TSH was normal in March 2021.  You may be due for recheck.  At this juncture, I recommend you follow-up with your primary care to discuss these issues.

## 2021-01-13 NOTE — Progress Notes (Signed)
Subjective:    Patient ID: Belinda Turner is a 55 y.o. female.  HPI    Huston FoleySaima Turner Hagan, MD, PhD [facility] [address], [address] [address] [place], [place] [address]  Dear Belinda ElBritney,  I saw your patient, Cleophus MoltKristina Turner, upon your kind request, in the neurologic clinic today for initial consultation of her tremor.  The patient is unaccompanied today.  As you know, Ms. Belinda GrieveBryan is a 55 year old woman with an underlying medical history of asthma, bipolar disease, hypertension, migraine, history of stroke, thyroid disease, and severe obesity with a BMI of over 45, who reports a several month history of bilateral hand tremors, they are worse on the left.  Symptoms started gradually about 6 months ago and have progressed.  She is left-handed.  She denies a family history of tremor or Parkinson's disease.  She has not had any recent falls but has fallen before.  She suffers from knee pain on the right side and has seen orthopedics for this.  She has been using Voltaren gel 4 times a day and may need a injection.  She limits her caffeine to 1 serving of soda per day.  She does not drink any coffee or tea.  She does not drink any alcohol on a regular basis, rarely.  She is a non-smoker.  She lives with her husband and her 2 teenage sons who are in high school.  She has recently returned back to work, she works in [employer].  She does not sleep very well, she takes trazodone at night for sleep.  Some 2 years ago she had a sleep study, [place], she reports that she was told she has snoring but no sleep apnea. I reviewed your office note from 10/15/2020.  Of note, she has been on psychotropic medications, she is currently on Wellbutrin XL 300 mg daily, Vraylar 3 mg daily, hydroxyzine 25 mg twice daily, trazodone 100 mg at bedtime.  She has been on Vraylar for about 5 or 6 years.  This was started by her previous primary care physician.  She had seen a psychiatrist  in the past but currently is not seeing a psychiatrist.  She has been on trazodone for about 18 months.  TSH in March 2021 was normal.  Her Past Medical History Is Significant For: Past Medical History:  Diagnosis Date   Asthma    Bipolar 1 disorder (HCC)    Depression    Hypertension    Migraine    Stroke (HCC) 2016   2 strokes   Thyroid disease     Her Past Surgical History Is Significant For: Past Surgical History:  Procedure Laterality Date   ABDOMINAL HYSTERECTOMY  2010   CESAREAN SECTION  2005   CESAREAN SECTION  2006   CHOLECYSTECTOMY  2014   COLONOSCOPY WITH PROPOFOL N/A 03/01/2020   Procedure: COLONOSCOPY WITH PROPOFOL;  Surgeon: Corbin Adeourk, Robert M, MD;  [facility];  Service: Endoscopy;  Laterality: N/A;  2:00pm   THYROIDECTOMY  2013    Her Family History Is Significant For: Family History  Problem Relation Age of Onset   Depression Mother    Thyroid disease Mother    Diabetes Father    Thyroid disease Sister    ADD / ADHD Son    Autism Son    Bipolar disorder Son    Thyroid disease Maternal Grandmother    Breast cancer Maternal Grandmother    Diabetes Paternal Grandmother  Diabetes Paternal Grandfather    Colon cancer Neg Hx     Her Social History Is Significant For: Social History   Socioeconomic History   Marital status: Married    Spouse name: Not on file   Number of children: Not on file   Years of education: Not on file   Highest education level: Not on file  Occupational History   Not on file  Tobacco Use   Smoking status: Former    Pack years: 0.00    Types: Cigarettes    Quit date: 1984    Years since quitting: 38.5   Smokeless tobacco: Never  Vaping Use   Vaping Use: Never used  Substance and Sexual Activity   Alcohol use: Yes    Comment: occ- 1-2 times a month.    Drug use: Never   Sexual activity: Yes    Birth control/protection: None  Other Topics Concern   Not on file  Social History Narrative   Not on file    Social Determinants of Health   Financial Resource Strain: Not on file  Food Insecurity: Not on file  Transportation Needs: Not on file  Physical Activity: Not on file  Stress: Not on file  Social Connections: Not on file    Her Allergies Are:  Allergies  Allergen Reactions   Cortisone Nausea And Vomiting and Swelling   Elemental Sulfur Nausea And Vomiting and Swelling  :   Her Current Medications Are:  Outpatient Encounter Medications as of 01/13/2021  Medication Sig   albuterol (VENTOLIN HFA) 108 (90 Base) MCG/ACT inhaler Inhale 2 puffs into the lungs every 6 (six) hours as needed for wheezing or shortness of breath.    buPROPion (WELLBUTRIN XL) 300 MG 24 hr tablet Take 1 tablet (300 mg total) by mouth daily.   cariprazine (VRAYLAR) capsule Take 1 capsule (3 mg total) by mouth daily.   Chromium-Cinnamon (CINNAMON PLUS CHROMIUM) [phone] MCG-MG CAPS Take 1,000 mcg by mouth daily.   diclofenac Sodium (VOLTAREN) 1 % GEL Apply 2 g topically 4 (four) times daily.   glucosamine-chondroitin 500-400 MG tablet Take 2 tablets by mouth daily.   hydrOXYzine (ATARAX/VISTARIL) 25 MG tablet Take 1 tablet (25 mg total) by mouth in the morning and at bedtime.   levothyroxine (SYNTHROID) 150 MCG tablet Take 1 tablet (150 mcg total) by mouth daily before breakfast.   lisinopril (ZESTRIL) 20 MG tablet Take 1 tablet (20 mg total) by mouth daily.   magnesium oxide (MAG-OX) 400 MG tablet Take 400 mg by mouth daily.   Multiple Vitamin (MULTIVITAMIN) tablet Take 1 tablet by mouth daily.   traZODone (DESYREL) 100 MG tablet Take 1 tablet (100 mg total) by mouth at bedtime.   No facility-administered encounter medications on file as of 01/13/2021.  :   Review of Systems:  Out of a complete 14 point review of systems, all are reviewed and negative with the exception of these symptoms as listed below:   Review of Systems  Neurological:        Here for consult on worsening bilateral hand tremors.  Left hand is noticeably worse, she is left handed. Pt sts sx will also affect her upper arms and legs at times. Pt sts sx have been present for 6 months or so.   Objective:  Neurological Exam  Physical Exam Physical Examination:   Vitals:   01/13/21 0742  BP: 122/82  Pulse: 98  SpO2: 99%    General Examination: The patient is a very pleasant  55 y.o. female in no acute distress. She appears well-developed and well-nourished and well groomed.   HEENT: Normocephalic, atraumatic, pupils are equal, round and reactive to light, extraocular tracking is well-preserved, she has corrective eyeglasses, mild bilateral cataracts are noted, funduscopic exam is therefore difficult but no obvious abnormalities are noted.  Face is symmetric with mild facial masking noted, no significant nuchal rigidity noted.  She has no significant lip, neck or jaw tremor, no carotid bruits.  Speech is clear without dysarthria or hypophonia or voice tremor.    Chest: Clear to auscultation without wheezing, rhonchi or crackles noted.  Heart: S1+S2+0, regular and normal without murmurs, rubs or gallops noted.   Abdomen: Soft, non-tender and non-distended.  Extremities: There is no pitting edema in the distal lower extremities bilaterally. Pedal pulses are intact.  Skin: Warm and dry without trophic changes noted. There are no varicose veins.  Musculoskeletal: exam reveals left knee discomfort.    Neurologically:  Mental status: The patient is awake, alert and oriented in all 4 spheres. Her immediate and remote memory, attention, language skills and fund of knowledge are appropriate. There is no evidence of aphasia, agnosia, apraxia or anomia. Speech is clear with normal prosody and enunciation. Thought process is linear. Mood is normal and affect is blunted.  Cranial nerves II - XII are as described above under HEENT exam. In addition: shoulder shrug is normal with equal shoulder height noted. Motor exam: Normal  bulk, strength and tone is noted. There is no drift, tremor or rebound.  On 01/13/2021: On Archimedes spiral drawing she has intermittent mild trembling with the left hand, mild nonspecific insecurity with the right hand, handwriting with the left hand is legible, not particularly tremulous, not particularly micrographic.  She has a slight bilateral upper extremity postural tremor, left more noticeable, no significant action tremor, no intention tremor, no lower extremity tremor noted.  Reflexes are 1+ in the upper extremities, trace in the lower extremities. Babinski: Toes are flexor bilaterally. Fine motor skills and coordination: She has mild difficulty with finger taps and hand movements in the left more than right upper extremities, foot taps are mildly impaired on the left, better on the right.   Cerebellar testing: No dysmetria or intention tremor on finger to nose testing. Heel to shin is unremarkable bilaterally. There is no truncal or gait ataxia.  Sensory exam: intact to light touch in the upper and lower extremities.  Gait, station and balance: She stands with mild difficulty, posture is slightly stooped for age, she walks with decreased stride length and pace, mild decrease in arm swing noted, she has a limp on the right.    Assessment and Plan:    In summary, Ivanna Kocak is a very pleasant 55 y.o.-year old female with an underlying medical history of asthma, bipolar disease, hypertension, migraine, history of stroke, thyroid disease, and severe obesity with a BMI of over 45, who presents for evaluation of her tremor disorder.  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  She has been on this for years.  She has asymmetrical findings and while it is not impossible that she has the beginning of idiopathic Parkinson's disease, drug-induced parkinsonism is more likely given  the clinical scenario.  She is advised to talk to about this medication and possibly tapering off of this if feasible.  She is advised that I would like to reevaluate her once she is  able to come off this medication.  She is advised to make a follow-up appointment with your office to talk about medication options and we will really evaluate her in about 6 months in this office.  We talked about tremor triggers as well today.  She is advised to stay well-hydrated, well rested, limit her caffeine.  She is advised to have her routine blood work done through your office as well as a recheck on her thyroid function.  Her TSH was normal in March last year.  I answered all her questions today and she was in agreement with the plan.   Thank you very much for allowing me to participate in the care of this nice patient. If I can be of any further assistance to you please do not hesitate to call me at [phone].  Sincerely,   Huston Foley, MD, PhD

## 2021-01-18 ENCOUNTER — Encounter: Payer: Self-pay | Admitting: Family Medicine

## 2021-01-18 ENCOUNTER — Ambulatory Visit (INDEPENDENT_AMBULATORY_CARE_PROVIDER_SITE_OTHER): Payer: No Typology Code available for payment source | Admitting: Family Medicine

## 2021-01-18 DIAGNOSIS — F319 Bipolar disorder, unspecified: Secondary | ICD-10-CM | POA: Diagnosis not present

## 2021-01-18 MED ORDER — CARIPRAZINE HCL 1.5 MG PO CAPS: ORAL_CAPSULE | ORAL | 0 refills | Status: DC

## 2021-01-18 NOTE — Progress Notes (Signed)
Virtual Visit via Telephone Note  I connected with Belinda Turner on 01/18/21 at 8:08 AM by telephone and verified that I am speaking with the correct person using two identifiers. Belinda Turner is currently located at home and her son is currently with her during this visit. The provider, Gwenlyn Fudge, FNP is located in their home at time of visit.  I discussed the limitations, risks, security and privacy concerns of performing an evaluation and management service by telephone and the availability of in person appointments. I also discussed with the patient that there may be a patient responsible charge related to this service. The patient expressed understanding and agreed to proceed.  Subjective: PCP: Gwenlyn Fudge, FNP  Chief Complaint  Patient presents with   Bipolar medication   Patient was seen by neurology last week due to a tremor.  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  Patient is agreeable in doing so.   ROS: Per HPI  Current Outpatient Medications:    albuterol (VENTOLIN HFA) 108 (90 Base) MCG/ACT inhaler, Inhale 2 puffs into the lungs every 6 (six) hours as needed for wheezing or shortness of breath. , Disp: , Rfl:    buPROPion (WELLBUTRIN XL) 300 MG 24 hr tablet, Take 1 tablet (300 mg total) by mouth daily., Disp: 90 tablet, Rfl: 3   cariprazine (VRAYLAR) capsule, Take 1 capsule (3 mg total) by mouth daily., Disp: 90 capsule, Rfl: 3   Chromium-Cinnamon (CINNAMON PLUS CHROMIUM) [phone] MCG-MG CAPS, Take 1,000 mcg by mouth daily., Disp: , Rfl:    diclofenac Sodium (VOLTAREN) 1 % GEL, Apply 2 g topically 4 (four) times daily., Disp: 350 g, Rfl: 3   glucosamine-chondroitin 500-400 MG tablet, Take 2 tablets by mouth daily., Disp: , Rfl:    hydrOXYzine (ATARAX/VISTARIL) 25 MG tablet, Take 1 tablet (25 mg total) by mouth in the morning and at bedtime., Disp: 180 tablet, Rfl: 3    levothyroxine (SYNTHROID) 150 MCG tablet, Take 1 tablet (150 mcg total) by mouth daily before breakfast., Disp: 90 tablet, Rfl: 3   lisinopril (ZESTRIL) 20 MG tablet, Take 1 tablet (20 mg total) by mouth daily., Disp: 90 tablet, Rfl: 3   magnesium oxide (MAG-OX) 400 MG tablet, Take 400 mg by mouth daily., Disp: , Rfl:    Multiple Vitamin (MULTIVITAMIN) tablet, Take 1 tablet by mouth daily., Disp: , Rfl:    traZODone (DESYREL) 100 MG tablet, Take 1 tablet (100 mg total) by mouth at bedtime., Disp: 90 tablet, Rfl: 3  Allergies  Allergen Reactions   Cortisone Nausea And Vomiting and Swelling   Elemental Sulfur Nausea And Vomiting and Swelling   Past Medical History:  Diagnosis Date   Asthma    Bipolar 1 disorder (HCC)    Depression    Hypertension    Migraine    Stroke (HCC) 2016   2 strokes   Thyroid disease     Observations/Objective: A&O  No respiratory distress or wheezing audible over the phone Mood, judgement, and thought processes all WNL   Assessment and Plan: 1. Bipolar 1 disorder (HCC) Discussed with patient that I do not prescribe for uncontrolled bipolar disorder, that we would need to refer to psychiatry for this.  Patient would like to wean off of Vraylar and see how she does before the referral to psychiatry is placed.  She is hopeful she will do well without the medication and not need  the referral.  Advised to decrease Vraylar dosage from 3 mg to 1.5 mg once daily x2 weeks, then decrease to every other day x2 weeks, then stop.  Discussed if at any point with a decrease she does not feel she is doing well she needs to go back to the previous dose and let me know. - cariprazine (VRAYLAR) 1.5 MG capsule; Take 1.5 mg by mouth once daily x2 weeks, then decrease to every other day x2 weeks, then stop.  Dispense: 30 capsule; Refill: 0   Follow Up Instructions: Return in about 4 weeks (around 02/15/2021) for Vraylar.  I discussed the assessment and treatment plan with the  patient. The patient was provided an opportunity to ask questions and all were answered. The patient agreed with the plan and demonstrated an understanding of the instructions.   The patient was advised to call back or seek an in-person evaluation if the symptoms worsen or if the condition fails to improve as anticipated.  The above assessment and management plan was discussed with the patient. The patient verbalized understanding of and has agreed to the management plan. Patient is aware to call the clinic if symptoms persist or worsen. Patient is aware when to return to the clinic for a follow-up visit. Patient educated on when it is appropriate to go to the emergency department.   Time call ended: 8:19 AM  I provided 11 minutes of non-face-to-face time during this encounter.  Deliah Boston, MSN, APRN, FNP-C [facility] 01/18/21

## 2021-02-13 ENCOUNTER — Other Ambulatory Visit: Payer: Self-pay | Admitting: Family Medicine

## 2021-02-13 DIAGNOSIS — F319 Bipolar disorder, unspecified: Secondary | ICD-10-CM

## 2021-02-16 ENCOUNTER — Ambulatory Visit: Payer: No Typology Code available for payment source | Admitting: Family Medicine

## 2021-02-16 ENCOUNTER — Encounter: Payer: Self-pay | Admitting: Family Medicine

## 2021-02-16 ENCOUNTER — Other Ambulatory Visit: Payer: Self-pay

## 2021-02-16 VITALS — BP 118/86 | HR 90 | Temp 97.4°F | Resp 20 | Ht 66.0 in | Wt 275.0 lb

## 2021-02-16 DIAGNOSIS — F319 Bipolar disorder, unspecified: Secondary | ICD-10-CM

## 2021-02-16 DIAGNOSIS — E039 Hypothyroidism, unspecified: Secondary | ICD-10-CM

## 2021-02-16 DIAGNOSIS — I10 Essential (primary) hypertension: Secondary | ICD-10-CM

## 2021-02-16 NOTE — Progress Notes (Signed)
Assessment & Plan:  1. Bipolar 1 disorder (HCC) Suggested a referral to Dr. Arbutus Leas (movement specialist neurologist) for further work-up and possible DaTscan. Patient would like to wait on this. She is going to wait a week to see if her tremor gets any better after she is completely off the medication for the week. If not, she will call her neurologist and try to get a sooner appointment than December for further assessment and plan. Education provided on tremors.    Return as scheduled.  Deliah Boston, MSN, APRN, FNP-C [facility]  Subjective:    Patient ID: Belinda Turner, female    DOB: Oct 18, 1965, 55 y.o.   MRN: [id number]  Patient Care Team: Gwenlyn Fudge, FNP as PCP - General (Family Medicine)   Chief Complaint:  Chief Complaint  Patient presents with   Medical Management of Chronic Issues    4 week med check     HPI: Belinda Turner is a 55 y.o. female presenting on 02/16/2021 for Medical Management of Chronic Issues (4 week med check )  Patient was previously seen by neurology due to a tremor.  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 This was completed; she took her last dosage last night. Her tremor has not improved. She feels fine from a mood perspective and does not feel she needs a new medication or a referral to psychiatry.  Depression screen [facility] 2/9 02/16/2021 09/21/2020 05/10/2020  Decreased Interest 0 0 0  Down, Depressed, Hopeless 0 1 1  PHQ - 2 Score 0 1 1  Altered sleeping 1 - 2  Tired, decreased energy 1 - 2  Change in appetite 0 - 0  Feeling bad or failure about yourself  0 - 0  Trouble concentrating 0 - 0  Moving slowly or fidgety/restless 0 - 0  Suicidal thoughts 0 - 0  PHQ-9 Score 2 - 5  Difficult doing work/chores Somewhat difficult - -   GAD 7 : Generalized Anxiety Score 02/16/2021 05/10/2020 03/24/2020 10/17/2019  Nervous, Anxious, on Edge 0 1 0 1   Control/stop worrying 0 0 0 0  Worry too much - different things 0 0 0 1  Trouble relaxing 1 0 0 0  Restless 0 0 0 0  Easily annoyed or irritable 0 1 1 1   Afraid - awful might happen 0 0 0 0  Total GAD 7 Score 1 2 1 3   Anxiety Difficulty Somewhat difficult Somewhat difficult - Not difficult at all    New complaints: None  Social history:  Relevant past medical, surgical, family and social history reviewed and updated as indicated. Interim medical history since our last visit reviewed.  Allergies and medications reviewed and updated.  DATA REVIEWED: CHART IN EPIC  ROS: Negative unless specifically indicated above in HPI.    Current Outpatient Medications:    albuterol (VENTOLIN HFA) 108 (90 Base) MCG/ACT inhaler, Inhale 2 puffs into the lungs every 6 (six) hours as needed for wheezing or shortness of breath. , Disp: , Rfl:    buPROPion (WELLBUTRIN XL) 300 MG 24 hr tablet, Take 1 tablet (300 mg total) by mouth daily., Disp: 90 tablet, Rfl: 3   Chromium-Cinnamon (CINNAMON PLUS CHROMIUM) [phone] MCG-MG CAPS, Take 1,000 mcg by mouth daily., Disp: , Rfl:    diclofenac Sodium (VOLTAREN) 1 % GEL, Apply 2 g topically 4 (four) times daily., Disp: 350 g, Rfl: 3   glucosamine-chondroitin 500-400  MG tablet, Take 2 tablets by mouth daily., Disp: , Rfl:    hydrOXYzine (ATARAX/VISTARIL) 25 MG tablet, Take 1 tablet (25 mg total) by mouth in the morning and at bedtime., Disp: 180 tablet, Rfl: 3   levothyroxine (SYNTHROID) 150 MCG tablet, Take 1 tablet (150 mcg total) by mouth daily before breakfast., Disp: 90 tablet, Rfl: 3   lisinopril (ZESTRIL) 20 MG tablet, Take 1 tablet (20 mg total) by mouth daily., Disp: 90 tablet, Rfl: 3   magnesium oxide (MAG-OX) 400 MG tablet, Take 400 mg by mouth daily., Disp: , Rfl:    Multiple Vitamin (MULTIVITAMIN) tablet, Take 1 tablet by mouth daily., Disp: , Rfl:    traZODone (DESYREL) 100 MG tablet, Take 1 tablet (100 mg total) by mouth at bedtime., Disp: 90  tablet, Rfl: 3   Allergies  Allergen Reactions   Cortisone Nausea And Vomiting and Swelling   Elemental Sulfur Nausea And Vomiting and Swelling   Past Medical History:  Diagnosis Date   Asthma    Bipolar 1 disorder (HCC)    Depression    Hypertension    Migraine    Stroke (HCC) 2016   2 strokes   Thyroid disease     Past Surgical History:  Procedure Laterality Date   ABDOMINAL HYSTERECTOMY  2010   CESAREAN SECTION  2005   CESAREAN SECTION  2006   CHOLECYSTECTOMY  2014   COLONOSCOPY WITH PROPOFOL N/A 03/01/2020   Procedure: COLONOSCOPY WITH PROPOFOL;  Surgeon: Corbin Ade, MD;  [facility];  Service: Endoscopy;  Laterality: N/A;  2:00pm   THYROIDECTOMY  2013    Social History   Socioeconomic History   Marital status: Married    Spouse name: Not on file   Number of children: Not on file   Years of education: Not on file   Highest education level: Not on file  Occupational History   Not on file  Tobacco Use   Smoking status: Former    Types: Cigarettes    Quit date: 1984    Years since quitting: 38.5   Smokeless tobacco: Never  Vaping Use   Vaping Use: Never used  Substance and Sexual Activity   Alcohol use: Yes    Comment: occ- 1-2 times a month.    Drug use: Never   Sexual activity: Yes    Birth control/protection: None  Other Topics Concern   Not on file  Social History Narrative   Not on file   Social Determinants of Health   Financial Resource Strain: Not on file  Food Insecurity: Not on file  Transportation Needs: Not on file  Physical Activity: Not on file  Stress: Not on file  Social Connections: Not on file  Intimate Partner Violence: Not on file        Objective:    BP 118/86   Pulse 90   Temp (!) 97.4 F (36.3 C)   Resp 20   Ht 5\' 6"  (1.676 m)   Wt 275 lb (124.7 kg)   SpO2 96%   BMI 44.39 kg/m   Wt Readings from Last 3 Encounters:  02/16/21 275 lb (124.7 kg)  01/13/21 272 lb (123.4 kg)  10/15/20 279 lb 6.4 oz  (126.7 kg)    Physical Exam Vitals reviewed.  Constitutional:      General: She is not in acute distress.    Appearance: Normal appearance. She is morbidly obese. She is not ill-appearing, toxic-appearing or diaphoretic.  HENT:  Head: Normocephalic and atraumatic.  Eyes:     General: No scleral icterus.       Right eye: No discharge.        Left eye: No discharge.     Conjunctiva/sclera: Conjunctivae normal.  Cardiovascular:     Rate and Rhythm: Normal rate.  Pulmonary:     Effort: Pulmonary effort is normal. No respiratory distress.  Musculoskeletal:        General: Normal range of motion.     Cervical back: Normal range of motion.  Skin:    General: Skin is warm and dry.     Capillary Refill: Capillary refill takes less than 2 seconds.  Neurological:     General: No focal deficit present.     Mental Status: She is alert and oriented to person, place, and time. Mental status is at baseline.     Motor: Tremor (resting) present.  Psychiatric:        Mood and Affect: Mood normal.        Behavior: Behavior normal.        Thought Content: Thought content normal.        Judgment: Judgment normal.    Lab Results  Component Value Date   TSH 3.290 10/17/2019   Lab Results  Component Value Date   WBC 6.7 10/17/2019   HGB 13.3 10/17/2019   HCT 41.6 10/17/2019   MCV 86 10/17/2019   PLT 247 10/17/2019   Lab Results  Component Value Date   NA 141 10/17/2019   K 4.3 10/17/2019   CO2 25 10/17/2019   GLUCOSE 89 10/17/2019   BUN 13 10/17/2019   CREATININE 1.05 (H) 10/17/2019   BILITOT 0.4 10/17/2019   ALKPHOS 97 10/17/2019   AST 16 10/17/2019   ALT 11 10/17/2019   PROT 6.2 10/17/2019   ALBUMIN 4.2 10/17/2019   CALCIUM 9.4 10/17/2019   Lab Results  Component Value Date   CHOL 137 10/17/2019   Lab Results  Component Value Date   HDL 50 10/17/2019   Lab Results  Component Value Date   LDLCALC 70 10/17/2019   Lab Results  Component Value Date   TRIG 91  10/17/2019   Lab Results  Component Value Date   CHOLHDL 2.7 10/17/2019   No results found for: HGBA1C

## 2021-03-31 ENCOUNTER — Encounter: Payer: Self-pay | Admitting: Family Medicine

## 2021-03-31 ENCOUNTER — Other Ambulatory Visit: Payer: Self-pay | Admitting: Family Medicine

## 2021-03-31 ENCOUNTER — Ambulatory Visit (INDEPENDENT_AMBULATORY_CARE_PROVIDER_SITE_OTHER): Payer: No Typology Code available for payment source | Admitting: Family Medicine

## 2021-03-31 ENCOUNTER — Other Ambulatory Visit: Payer: Self-pay

## 2021-03-31 VITALS — BP 122/80 | HR 88 | Temp 97.3°F | Ht 66.0 in | Wt 272.2 lb

## 2021-03-31 DIAGNOSIS — F319 Bipolar disorder, unspecified: Secondary | ICD-10-CM

## 2021-03-31 DIAGNOSIS — E039 Hypothyroidism, unspecified: Secondary | ICD-10-CM | POA: Diagnosis not present

## 2021-03-31 DIAGNOSIS — I10 Essential (primary) hypertension: Secondary | ICD-10-CM

## 2021-03-31 DIAGNOSIS — G252 Other specified forms of tremor: Secondary | ICD-10-CM

## 2021-03-31 DIAGNOSIS — Z0001 Encounter for general adult medical examination with abnormal findings: Secondary | ICD-10-CM | POA: Diagnosis not present

## 2021-03-31 DIAGNOSIS — J4520 Mild intermittent asthma, uncomplicated: Secondary | ICD-10-CM

## 2021-03-31 DIAGNOSIS — F325 Major depressive disorder, single episode, in full remission: Secondary | ICD-10-CM

## 2021-03-31 DIAGNOSIS — Z0000 Encounter for general adult medical examination without abnormal findings: Secondary | ICD-10-CM

## 2021-03-31 DIAGNOSIS — Z8673 Personal history of transient ischemic attack (TIA), and cerebral infarction without residual deficits: Secondary | ICD-10-CM

## 2021-03-31 DIAGNOSIS — Z23 Encounter for immunization: Secondary | ICD-10-CM | POA: Diagnosis not present

## 2021-03-31 MED ORDER — ROSUVASTATIN CALCIUM 5 MG PO TABS: 5.0000 mg | ORAL_TABLET | Freq: Every day | ORAL | 3 refills | Status: DC

## 2021-03-31 NOTE — Progress Notes (Signed)
Assessment & Plan:  1. Well adult exam - preventative health information provided - encouraged healthy diet and exercise - CBC with Differential/Platelet - CMP14+EGFR - Lipid panel  2. Acquired hypothyroidism - Well controlled on current regimen - continue taking levothyroxine as prescribed - T4, free - TSH  3. Essential hypertension - Well controlled on current regimen - encouraged to continue healthy diet and exercise - encouraged to continue to take BP at home - continue antihypertensives as prescribed - CBC with Differential/Platelet - CMP14+EGFR - Lipid panel  4. Bipolar 1 disorder ([place]) - Well controlled on current regimen - continue wellbutirin and hydroxyzine as prescribed  5. Major depressive disorder with single episode, in full remission ([place]) - Well controlled on current regimen - continue wellbutirin and hydroxyzine as prescribed  6. Mild intermittent asthma without complication - Well controlled on current regimen - albuterol available prn  7. History of stroke - continue baby aspirin daily - will add rosuvastatin 5 mg  8. Immunization due - TDAP given in office today  9. Resting tremoro - patient to follow-up with neurology in December    Follow-up: Return in about 1 year (around 03/31/2022) for annual physical.   Lucile Crater, NP Student  I personally was present during the history, physical exam, and medical decision-making activities of this service and have verified that the service and findings are accurately documented in the nurse practitioner student's note.  Hendricks Limes, MSN, APRN, FNP-C [facility]   Subjective:  Patient ID: Belinda Turner, female    DOB: 1966-07-13  Age: 55 y.o. MRN: [id number]  Patient Care Team: Loman Brooklyn, FNP as PCP - General (Family Medicine)   CC:  Chief Complaint  Patient presents with   Annual Exam    HPI Belinda Turner presents for her annual physical.    Occupation: at home, Marital status: married, Substance use: none Diet: regular, Exercise: walking once daily Last eye exam: 03/2020 Last dental exam: 10/2020 Last colonoscopy: 03/01/2020 Last mammogram: 10/05/20 Last pap smear: h/o total hysterectomy Hepatitis C Screening: declined Immunizations: Flu Vaccine:  not available yet, but will get when available Tdap Vaccine: declined  Shingrix Vaccine: declined  COVID-19 Vaccine: up to date  DEPRESSION SCREENING PHQ 2/9 Scores 03/31/2021 02/16/2021 09/21/2020 05/10/2020 05/10/2020 03/24/2020 10/17/2019  PHQ - 2 Score 0 0 1 1 0 0 2  PHQ- 9 Score 1 2 - 5 - 0 5    She does check her BP at home and reports it stays 120s/80s.   Review of Systems  Constitutional:  Negative for chills, fever, malaise/fatigue and weight loss.  HENT:  Negative for congestion, ear discharge, ear pain, nosebleeds, sinus pain, sore throat and tinnitus.   Eyes:  Negative for blurred vision, double vision, pain, discharge and redness.  Respiratory:  Negative for cough, shortness of breath and wheezing.   Cardiovascular:  Negative for chest pain, palpitations and leg swelling.  Gastrointestinal:  Negative for abdominal pain, constipation, diarrhea, heartburn, nausea and vomiting.  Genitourinary:  Negative for dysuria, frequency and urgency.  Musculoskeletal:  Negative for myalgias.  Skin:  Negative for rash.  Neurological:  Positive for tremors. Negative for dizziness, seizures, weakness and headaches.  Psychiatric/Behavioral:  Negative for depression, substance abuse and suicidal ideas. The patient is not nervous/anxious.    Current Outpatient Medications:    albuterol (VENTOLIN HFA) 108 (90 Base) MCG/ACT inhaler, Inhale 2 puffs into the lungs every 6 (six) hours as needed for wheezing or shortness of  breath. , Disp: , Rfl:    aspirin EC 81 MG tablet, Take 81 mg by mouth daily. Swallow whole., Disp: , Rfl:    buPROPion (WELLBUTRIN XL) 300 MG 24 hr tablet, TAKE 1 TABLET BY  MOUTH EVERY DAY, Disp: 90 tablet, Rfl: 0   Chromium-Cinnamon (CINNAMON PLUS CHROMIUM) 4452740126 MCG-MG CAPS, Take 1,000 mcg by mouth daily., Disp: , Rfl:    diclofenac Sodium (VOLTAREN) 1 % GEL, Apply 2 g topically 4 (four) times daily., Disp: 350 g, Rfl: 3   glucosamine-chondroitin 500-400 MG tablet, Take 2 tablets by mouth daily., Disp: , Rfl:    hydrOXYzine (ATARAX/VISTARIL) 25 MG tablet, Take 1 tablet (25 mg total) by mouth in the morning and at bedtime., Disp: 180 tablet, Rfl: 3   levothyroxine (SYNTHROID) 150 MCG tablet, Take 1 tablet (150 mcg total) by mouth daily before breakfast., Disp: 90 tablet, Rfl: 3   lisinopril (ZESTRIL) 20 MG tablet, TAKE 1 TABLET BY MOUTH EVERY DAY, Disp: 90 tablet, Rfl: 0   magnesium oxide (MAG-OX) 400 MG tablet, Take 400 mg by mouth daily., Disp: , Rfl:    Multiple Vitamin (MULTIVITAMIN) tablet, Take 1 tablet by mouth daily., Disp: , Rfl:    rosuvastatin (CRESTOR) 5 MG tablet, Take 1 tablet (5 mg total) by mouth daily., Disp: 90 tablet, Rfl: 3   traZODone (DESYREL) 100 MG tablet, Take 1 tablet (100 mg total) by mouth at bedtime., Disp: 90 tablet, Rfl: 3  Allergies  Allergen Reactions   Cortisone Nausea And Vomiting and Swelling   Elemental Sulfur Nausea And Vomiting and Swelling    Past Medical History:  Diagnosis Date   Asthma    Bipolar 1 disorder ([place])    Depression    Hypertension    Migraine    Stroke ([place]) 2016   2 strokes   Thyroid disease     Past Surgical History:  Procedure Laterality Date   CESAREAN SECTION  2005   CESAREAN SECTION  2006   CHOLECYSTECTOMY  2014   COLONOSCOPY WITH PROPOFOL N/A 03/01/2020   Procedure: COLONOSCOPY WITH PROPOFOL;  Surgeon: Daneil Dolin, MD;  [facility];  Service: Endoscopy;  Laterality: N/A;  2:00pm   THYROIDECTOMY  2013   TOTAL ABDOMINAL HYSTERECTOMY  2010    Family History  Problem Relation Age of Onset   Depression Mother    Thyroid disease Mother    Diabetes Father    Thyroid  disease Sister    ADD / ADHD Son    Autism Son    Bipolar disorder Son    Thyroid disease Maternal Grandmother    Breast cancer Maternal Grandmother    Diabetes Paternal Grandmother    Diabetes Paternal Grandfather    Colon cancer Neg Hx     Social History   Socioeconomic History   Marital status: Married    Spouse name: Not on file   Number of children: Not on file   Years of education: Not on file   Highest education level: Not on file  Occupational History   Not on file  Tobacco Use   Smoking status: Former    Types: Cigarettes    Quit date: 1984    Years since quitting: 38.7   Smokeless tobacco: Never  Vaping Use   Vaping Use: Never used  Substance and Sexual Activity   Alcohol use: Yes    Comment: occ- 1-2 times a month.    Drug use: Never   Sexual activity: Yes  Birth control/protection: None  Other Topics Concern   Not on file  Social History Narrative   Not on file   Social Determinants of Health   Financial Resource Strain: Not on file  Food Insecurity: Not on file  Transportation Needs: Not on file  Physical Activity: Not on file  Stress: Not on file  Social Connections: Not on file  Intimate Partner Violence: Not on file      Objective:    BP 122/80   Pulse 88   Temp (!) 97.3 F (36.3 C) (Temporal)   Ht '5\' 6"'  (1.676 m)   Wt 123.5 kg   SpO2 93%   BMI 43.93 kg/m   Wt Readings from Last 3 Encounters:  03/31/21 123.5 kg  02/16/21 124.7 kg  01/13/21 123.4 kg    Physical Exam Vitals reviewed.  Constitutional:      General: She is not in acute distress.    Appearance: Normal appearance. She is morbidly obese. She is not ill-appearing, toxic-appearing or diaphoretic.  HENT:     Head: Normocephalic and atraumatic.     Right Ear: Tympanic membrane, ear canal and external ear normal. There is no impacted cerumen.     Left Ear: Tympanic membrane, ear canal and external ear normal. There is no impacted cerumen.     Nose: Nose normal. No  congestion or rhinorrhea.     Mouth/Throat:     Mouth: Mucous membranes are moist.     Pharynx: Oropharynx is clear. No oropharyngeal exudate or posterior oropharyngeal erythema.  Eyes:     General: No scleral icterus.       Right eye: No discharge.        Left eye: No discharge.     Conjunctiva/sclera: Conjunctivae normal.     Pupils: Pupils are equal, round, and reactive to light.  Cardiovascular:     Rate and Rhythm: Normal rate and regular rhythm.     Heart sounds: Normal heart sounds. No murmur heard.   No friction rub. No gallop.  Pulmonary:     Effort: Pulmonary effort is normal. No respiratory distress.     Breath sounds: Normal breath sounds. No stridor. No wheezing, rhonchi or rales.  Abdominal:     General: Abdomen is flat. Bowel sounds are normal. There is no distension.     Palpations: Abdomen is soft. There is no mass.     Tenderness: There is no abdominal tenderness. There is no guarding or rebound.     Hernia: No hernia is present.  Musculoskeletal:        General: Normal range of motion.     Cervical back: Normal range of motion and neck supple. No rigidity. No muscular tenderness.  Lymphadenopathy:     Cervical: No cervical adenopathy.  Skin:    General: Skin is warm and dry.     Capillary Refill: Capillary refill takes less than 2 seconds.  Neurological:     General: No focal deficit present.     Mental Status: She is alert and oriented to person, place, and time. Mental status is at baseline.     Motor: Tremor present.  Psychiatric:        Mood and Affect: Mood normal.        Behavior: Behavior normal.        Thought Content: Thought content normal.        Judgment: Judgment normal.    Lab Results  Component Value Date   TSH 3.290 10/17/2019   Lab  Results  Component Value Date   WBC 6.7 10/17/2019   HGB 13.3 10/17/2019   HCT 41.6 10/17/2019   MCV 86 10/17/2019   PLT 247 10/17/2019   Lab Results  Component Value Date   NA 141 10/17/2019   K  4.3 10/17/2019   CO2 25 10/17/2019   GLUCOSE 89 10/17/2019   BUN 13 10/17/2019   CREATININE 1.05 (H) 10/17/2019   BILITOT 0.4 10/17/2019   ALKPHOS 97 10/17/2019   AST 16 10/17/2019   ALT 11 10/17/2019   PROT 6.2 10/17/2019   ALBUMIN 4.2 10/17/2019   CALCIUM 9.4 10/17/2019   Lab Results  Component Value Date   CHOL 137 10/17/2019   Lab Results  Component Value Date   HDL 50 10/17/2019   Lab Results  Component Value Date   LDLCALC 70 10/17/2019   Lab Results  Component Value Date   TRIG 91 10/17/2019   Lab Results  Component Value Date   CHOLHDL 2.7 10/17/2019   No results found for: HGBA1C

## 2021-03-31 NOTE — Patient Instructions (Addendum)
Reminder: please return after mid-September for your flu shot. If you receive this elsewhere, such as your pharmacy, please let us know so we can get this documented in your chart. We have flu clinic days scheduled during the month of October, if you would like to go ahead and schedule for this.   I recommend you start taking a daily baby aspirin 81 mg.

## 2021-04-01 LAB — CBC WITH DIFFERENTIAL/PLATELET
Basophils Absolute: 0.1 10*3/uL (ref 0.0–0.2)
Basos: 1 %
EOS (ABSOLUTE): 0.1 10*3/uL (ref 0.0–0.4)
Eos: 1 %
Hematocrit: 42.4 % (ref 34.0–46.6)
Hemoglobin: 13.9 g/dL (ref 11.1–15.9)
Immature Grans (Abs): 0 10*3/uL (ref 0.0–0.1)
Immature Granulocytes: 0 %
Lymphocytes Absolute: 2.7 10*3/uL (ref 0.7–3.1)
Lymphs: 32 %
MCH: 27.4 pg (ref 26.6–33.0)
MCHC: 32.8 g/dL (ref 31.5–35.7)
MCV: 84 fL (ref 79–97)
Monocytes Absolute: 1 10*3/uL — ABNORMAL HIGH (ref 0.1–0.9)
Monocytes: 11 %
Neutrophils Absolute: 4.6 10*3/uL (ref 1.4–7.0)
Neutrophils: 55 %
Platelets: 289 10*3/uL (ref 150–450)
RBC: 5.08 x10E6/uL (ref 3.77–5.28)
RDW: 15 % (ref 11.7–15.4)
WBC: 8.5 10*3/uL (ref 3.4–10.8)

## 2021-04-01 LAB — LIPID PANEL
Chol/HDL Ratio: 3.4 ratio (ref 0.0–4.4)
Cholesterol, Total: 137 mg/dL (ref 100–199)
HDL: 40 mg/dL (ref >39)
LDL Chol Calc (NIH): 79 mg/dL (ref 0–99)
Triglycerides: 93 mg/dL (ref 0–149)
VLDL Cholesterol Cal: 18 mg/dL (ref 5–40)

## 2021-04-01 LAB — CMP14+EGFR
ALT: 19 IU/L (ref 0–32)
AST: 22 IU/L (ref 0–40)
Albumin/Globulin Ratio: 2.1 (ref 1.2–2.2)
Albumin: 4.5 g/dL (ref 3.8–4.9)
Alkaline Phosphatase: 102 IU/L (ref 44–121)
BUN/Creatinine Ratio: 11 (ref 9–23)
BUN: 11 mg/dL (ref 6–24)
Bilirubin Total: 0.8 mg/dL (ref 0.0–1.2)
CO2: 23 mmol/L (ref 20–29)
Calcium: 9.9 mg/dL (ref 8.7–10.2)
Chloride: 101 mmol/L (ref 96–106)
Creatinine, Ser: 0.98 mg/dL (ref 0.57–1.00)
Globulin, Total: 2.1 g/dL (ref 1.5–4.5)
Glucose: 81 mg/dL (ref 65–99)
Potassium: 4.5 mmol/L (ref 3.5–5.2)
Sodium: 140 mmol/L (ref 134–144)
Total Protein: 6.6 g/dL (ref 6.0–8.5)
eGFR: 68 mL/min/{1.73_m2} (ref >59)

## 2021-04-01 LAB — T4, FREE: Free T4: 2.17 ng/dL — ABNORMAL HIGH (ref 0.82–1.77)

## 2021-04-01 LAB — TSH: TSH: 2.05 u[IU]/mL (ref 0.450–4.500)

## 2021-04-09 ENCOUNTER — Other Ambulatory Visit: Payer: Self-pay | Admitting: Family Medicine

## 2021-04-09 DIAGNOSIS — F325 Major depressive disorder, single episode, in full remission: Secondary | ICD-10-CM

## 2021-04-10 ENCOUNTER — Encounter: Payer: Self-pay | Admitting: Family Medicine

## 2021-04-10 DIAGNOSIS — F319 Bipolar disorder, unspecified: Secondary | ICD-10-CM

## 2021-04-11 MED ORDER — HYDROXYZINE HCL 25 MG PO TABS: 25.0000 mg | ORAL_TABLET | Freq: Two times a day (BID) | ORAL | 3 refills | Status: DC

## 2021-04-15 ENCOUNTER — Other Ambulatory Visit: Payer: Self-pay | Admitting: Family Medicine

## 2021-04-15 DIAGNOSIS — E039 Hypothyroidism, unspecified: Secondary | ICD-10-CM

## 2021-05-09 ENCOUNTER — Other Ambulatory Visit (HOSPITAL_COMMUNITY): Payer: Self-pay

## 2021-05-09 MED ORDER — INFLUENZA VAC SPLIT QUAD 0.5 ML IM SUSY
PREFILLED_SYRINGE | INTRAMUSCULAR | 0 refills | Status: DC
  Filled 2021-05-09: qty 0.5, 1d supply, fill #0

## 2021-05-16 ENCOUNTER — Ambulatory Visit (INDEPENDENT_AMBULATORY_CARE_PROVIDER_SITE_OTHER): Payer: No Typology Code available for payment source | Admitting: Family Medicine

## 2021-05-16 ENCOUNTER — Encounter: Payer: Self-pay | Admitting: Family Medicine

## 2021-05-16 DIAGNOSIS — R6889 Other general symptoms and signs: Secondary | ICD-10-CM

## 2021-05-16 DIAGNOSIS — J069 Acute upper respiratory infection, unspecified: Secondary | ICD-10-CM | POA: Diagnosis not present

## 2021-05-16 LAB — VERITOR FLU A/B WAIVED
Influenza A: NEGATIVE
Influenza B: NEGATIVE

## 2021-05-16 NOTE — Progress Notes (Signed)
Virtual Visit via telephone Note Due to COVID-19 pandemic this visit was conducted virtually. This visit type was conducted due to national recommendations for restrictions regarding the COVID-19 Pandemic (e.g. social distancing, sheltering in place) in an effort to limit this patient's exposure and mitigate transmission in our community. All issues noted in this document were discussed and addressed.  A physical exam was not performed with this format.   I connected with Rickie Gutierres on 05/16/2021 at 0815 by telephone and verified that I am speaking with the correct person using two identifiers. Darlene Brozowski is currently located at home and family is currently with them during visit. The provider, Kari Baars, FNP is located in their office at time of visit.  I discussed the limitations, risks, security and privacy concerns of performing an evaluation and management service by telephone and the availability of in person appointments. I also discussed with the patient that there may be a patient responsible charge related to this service. The patient expressed understanding and agreed to proceed.  Subjective:  Patient ID: Belinda Turner, female    DOB: 1966/07/04, 55 y.o.   MRN: [id number]  Chief Complaint:  Sore Throat, Cough, and Generalized Body Aches   HPI: Belinda Turner is a 55 y.o. female presenting on 05/16/2021 for Sore Throat, Cough, and Generalized Body Aches   Pt reports sore throat, cough, congestion, rhinorrhea, and myalgias for the last 2 days. Denies known exposure to sick contact. States her son has developed symptoms as well.   Sore Throat  This is a new problem. The current episode started in the past 7 days. The problem has been waxing and waning. Neither side of throat is experiencing more pain than the other. There has been no fever. The pain is at a severity of 3/10. The pain is mild. Associated symptoms include congestion, coughing and swollen  glands. Pertinent negatives include no abdominal pain, diarrhea, drooling, ear discharge, ear pain, headaches, hoarse voice, plugged ear sensation, neck pain, shortness of breath, stridor, trouble swallowing or vomiting. She has had no exposure to strep or mono. She has tried nothing for the symptoms.  Cough This is a new problem. The current episode started in the past 7 days. The problem has been waxing and waning. The cough is Non-productive. Associated symptoms include myalgias, nasal congestion, postnasal drip, rhinorrhea and a sore throat. Pertinent negatives include no chest pain, chills, ear congestion, ear pain, fever, headaches, heartburn, hemoptysis, rash, shortness of breath, sweats, weight loss or wheezing. Nothing aggravates the symptoms. She has tried nothing for the symptoms.    Relevant past medical, surgical, family, and social history reviewed and updated as indicated.  Allergies and medications reviewed and updated.   Past Medical History:  Diagnosis Date   Asthma    Bipolar 1 disorder ([facility])    Depression    Hypertension    Migraine    Stroke ([facility]) 2016   2 strokes   Thyroid disease     Past Surgical History:  Procedure Laterality Date   CESAREAN SECTION  2005   CESAREAN SECTION  2006   CHOLECYSTECTOMY  2014   COLONOSCOPY WITH PROPOFOL N/A 03/01/2020   Procedure: COLONOSCOPY WITH PROPOFOL;  Surgeon: Corbin Ade, MD;  [facility];  Service: Endoscopy;  Laterality: N/A;  2:00pm   THYROIDECTOMY  2013   TOTAL ABDOMINAL HYSTERECTOMY  2010    Social History   Socioeconomic History   Marital status: Married  Spouse name: Not on file   Number of children: Not on file   Years of education: Not on file   Highest education level: Not on file  Occupational History   Not on file  Tobacco Use   Smoking status: Former    Types: Cigarettes    Quit date: 55    Years since quitting: 38.8   Smokeless tobacco: Never  Vaping Use   Vaping Use: Never  used  Substance and Sexual Activity   Alcohol use: Yes    Comment: occ- 1-2 times a month.    Drug use: Never   Sexual activity: Yes    Birth control/protection: None  Other Topics Concern   Not on file  Social History Narrative   Not on file   Social Determinants of Health   Financial Resource Strain: Not on file  Food Insecurity: Not on file  Transportation Needs: Not on file  Physical Activity: Not on file  Stress: Not on file  Social Connections: Not on file  Intimate Partner Violence: Not on file    Outpatient Encounter Medications as of 05/16/2021  Medication Sig   albuterol (VENTOLIN HFA) 108 (90 Base) MCG/ACT inhaler Inhale 2 puffs into the lungs every 6 (six) hours as needed for wheezing or shortness of breath.    aspirin EC 81 MG tablet Take 81 mg by mouth daily. Swallow whole.   buPROPion (WELLBUTRIN XL) 300 MG 24 hr tablet TAKE 1 TABLET BY MOUTH EVERY DAY   Chromium-Cinnamon (CINNAMON PLUS CHROMIUM) [id number] MCG-MG CAPS Take 1,000 mcg by mouth daily.   diclofenac Sodium (VOLTAREN) 1 % GEL Apply 2 g topically 4 (four) times daily.   glucosamine-chondroitin 500-400 MG tablet Take 2 tablets by mouth daily.   hydrOXYzine (ATARAX/VISTARIL) 25 MG tablet Take 1 tablet (25 mg total) by mouth in the morning and at bedtime.   influenza vac split quadrivalent PF (FLUARIX) 0.5 ML injection Inject into the muscle.   levothyroxine (SYNTHROID) 150 MCG tablet TAKE 1 TABLET BY MOUTH DAILY BEFORE BREAKFAST.   lisinopril (ZESTRIL) 20 MG tablet TAKE 1 TABLET BY MOUTH EVERY DAY   magnesium oxide (MAG-OX) 400 MG tablet Take 400 mg by mouth daily.   Multiple Vitamin (MULTIVITAMIN) tablet Take 1 tablet by mouth daily.   rosuvastatin (CRESTOR) 5 MG tablet Take 1 tablet (5 mg total) by mouth daily.   traZODone (DESYREL) 100 MG tablet TAKE 1 TABLET BY MOUTH EVERYDAY AT BEDTIME   No facility-administered encounter medications on file as of 05/16/2021.    Allergies  Allergen Reactions    Cortisone Nausea And Vomiting and Swelling   Elemental Sulfur Nausea And Vomiting and Swelling    Review of Systems  Constitutional:  Positive for appetite change. Negative for activity change, chills, diaphoresis, fatigue, fever, unexpected weight change and weight loss.  HENT:  Positive for congestion, postnasal drip, rhinorrhea and sore throat. Negative for dental problem, drooling, ear discharge, ear pain, facial swelling, hearing loss, hoarse voice, mouth sores, nosebleeds, sinus pressure, sinus pain, sneezing, tinnitus, trouble swallowing and voice change.   Respiratory:  Positive for cough. Negative for hemoptysis, shortness of breath, wheezing and stridor.   Cardiovascular:  Negative for chest pain and palpitations.  Gastrointestinal:  Negative for abdominal pain, diarrhea, heartburn and vomiting.  Genitourinary:  Negative for decreased urine volume and difficulty urinating.  Musculoskeletal:  Positive for myalgias. Negative for neck pain.  Skin:  Negative for rash.  Neurological:  Negative for weakness and headaches.  Psychiatric/Behavioral:  Negative  for confusion.   All other systems reviewed and are negative.       Observations/Objective: No vital signs or physical exam, this was a telephone or virtual health encounter.  Pt alert and oriented, answers all questions appropriately, and able to speak in full sentences.    Assessment and Plan: Haneen was seen today for sore throat, cough and generalized body aches.  Diagnoses and all orders for this visit:  Influenza-like symptoms URI with cough and congestion Reported symptoms concerning for influenza or COVID-19. Will test for both. Symptomatic care discussed in detail. Further treatment pending test results. Pt aware to report any new, worsening, or persistent symptoms. Follow up as needed.  -     Veritor Flu A/B Waived -     Novel Coronavirus, NAA ([facility])     Follow Up Instructions: Return if symptoms worsen or  fail to improve.    I discussed the assessment and treatment plan with the patient. The patient was provided an opportunity to ask questions and all were answered. The patient agreed with the plan and demonstrated an understanding of the instructions.   The patient was advised to call back or seek an in-person evaluation if the symptoms worsen or if the condition fails to improve as anticipated.  The above assessment and management plan was discussed with the patient. The patient verbalized understanding of and has agreed to the management plan. Patient is aware to call the clinic if they develop any new symptoms or if symptoms persist or worsen. Patient is aware when to return to the clinic for a follow-up visit. Patient educated on when it is appropriate to go to the emergency department.    I provided 12 minutes of non-face-to-face time during this encounter. The call started at 0815. The call ended at 0825. The other time was used for coordination of care.    Kari Baars, FNP-C [facility] [address] [place], [place] [address] [phone] 05/16/2021

## 2021-05-17 LAB — NOVEL CORONAVIRUS, NAA: SARS-CoV-2, NAA: NOT DETECTED

## 2021-05-17 LAB — SARS-COV-2, NAA 2 DAY TAT

## 2021-06-18 ENCOUNTER — Other Ambulatory Visit: Payer: Self-pay | Admitting: Family Medicine

## 2021-06-18 DIAGNOSIS — I10 Essential (primary) hypertension: Secondary | ICD-10-CM

## 2021-06-18 DIAGNOSIS — F325 Major depressive disorder, single episode, in full remission: Secondary | ICD-10-CM

## 2021-07-11 ENCOUNTER — Ambulatory Visit: Payer: No Typology Code available for payment source | Admitting: Neurology

## 2021-08-18 ENCOUNTER — Encounter: Payer: Self-pay | Admitting: Family Medicine

## 2021-09-01 ENCOUNTER — Ambulatory Visit (INDEPENDENT_AMBULATORY_CARE_PROVIDER_SITE_OTHER): Payer: 59 | Admitting: Family Medicine

## 2021-09-01 ENCOUNTER — Encounter: Payer: Self-pay | Admitting: Family Medicine

## 2021-09-01 VITALS — BP 154/74 | HR 97 | Temp 97.8°F | Ht 66.0 in | Wt 267.8 lb

## 2021-09-01 DIAGNOSIS — J302 Other seasonal allergic rhinitis: Secondary | ICD-10-CM | POA: Diagnosis not present

## 2021-09-01 DIAGNOSIS — F325 Major depressive disorder, single episode, in full remission: Secondary | ICD-10-CM

## 2021-09-01 DIAGNOSIS — M1711 Unilateral primary osteoarthritis, right knee: Secondary | ICD-10-CM

## 2021-09-01 MED ORDER — BUPROPION HCL ER (XL) 300 MG PO TB24: 300.0000 mg | ORAL_TABLET | Freq: Every day | ORAL | 1 refills | Status: DC

## 2021-09-01 MED ORDER — CETIRIZINE HCL 10 MG PO TABS: 10.0000 mg | ORAL_TABLET | Freq: Every day | ORAL | 1 refills | Status: DC

## 2021-09-01 NOTE — Progress Notes (Signed)
Assessment & Plan:  1. Primary osteoarthritis of right knee Continue Voltaren gel as needed. Discussed if injection is not helpful, she will need a referral to an orthopedic to discuss possible knee replacement.  - Joint Injection/Arthrocentesis  2. Seasonal allergies Uncontrolled. Started daily antihistamine. - cetirizine (ZYRTEC ALLERGY) 10 MG tablet; Take 1 tablet (10 mg total) by mouth daily.  Dispense: 90 tablet; Refill: 1   Return if symptoms worsen or fail to improve.  Hendricks Limes, MSN, APRN, FNP-C [facility]  Subjective:    Patient ID: Belinda Turner, female    DOB: [birth year], 56 y.o.   MRN: [id number]  Patient Care Team: Loman Brooklyn, FNP as PCP - General (Family Medicine)   Chief Complaint:  Chief Complaint  Patient presents with   Knee Pain    Right knee pain x 1 year that is no better    HPI: Conita Amenta is a 56 y.o. female presenting on 09/01/2021 for Knee Pain (Right knee pain x 1 year that is no better)  Patient presents with knee pain involving the right knee. Onset of the symptoms was  a year ago . Inciting event: none known. Current symptoms include pain located all around the knee . Pain is aggravated by any weight bearing.  Evaluation to date: plain films: abnormal with moderate tricompartmental joint space narrowing and osteophytosis. Arthritic findings significantly worsened compared to prior imaging in 2018 . Treatment to date: avoidance of offending activity and Voltaren gel .  New complaints: Patient reports a cough that is present every day for the past 3-4 months like something is stuck in her throat. Denies acid reflux, shortness of breath, or wheezing. Worse later in the day and into the night. She has been on Lisinopril for years. No recent illnesses.    Social history:  Relevant past medical, surgical, family and social history reviewed and updated as indicated. Interim medical history since our last  visit reviewed.  Allergies and medications reviewed and updated.  DATA REVIEWED: CHART IN EPIC  ROS: Negative unless specifically indicated above in HPI.    Current Outpatient Medications:    albuterol (VENTOLIN HFA) 108 (90 Base) MCG/ACT inhaler, Inhale 2 puffs into the lungs every 6 (six) hours as needed for wheezing or shortness of breath. , Disp: , Rfl:    buPROPion (WELLBUTRIN XL) 300 MG 24 hr tablet, TAKE 1 TABLET BY MOUTH EVERY DAY, Disp: 90 tablet, Rfl: 0   Chromium-Cinnamon (CINNAMON PLUS CHROMIUM) [phone] MCG-MG CAPS, Take 1,000 mcg by mouth daily., Disp: , Rfl:    diclofenac Sodium (VOLTAREN) 1 % GEL, Apply 2 g topically 4 (four) times daily., Disp: 350 g, Rfl: 3   glucosamine-chondroitin 500-400 MG tablet, Take 2 tablets by mouth daily., Disp: , Rfl:    hydrOXYzine (ATARAX/VISTARIL) 25 MG tablet, Take 1 tablet (25 mg total) by mouth in the morning and at bedtime., Disp: 180 tablet, Rfl: 3   influenza vac split quadrivalent PF (FLUARIX) 0.5 ML injection, Inject into the muscle., Disp: 0.5 mL, Rfl: 0   levothyroxine (SYNTHROID) 150 MCG tablet, TAKE 1 TABLET BY MOUTH DAILY BEFORE BREAKFAST., Disp: 90 tablet, Rfl: 3   lisinopril (ZESTRIL) 20 MG tablet, TAKE 1 TABLET BY MOUTH EVERY DAY, Disp: 90 tablet, Rfl: 1   magnesium oxide (MAG-OX) 400 MG tablet, Take 400 mg by mouth daily., Disp: , Rfl:    Multiple Vitamin (MULTIVITAMIN) tablet, Take 1 tablet by mouth daily., Disp: , Rfl:    rosuvastatin (  CRESTOR) 5 MG tablet, Take 1 tablet (5 mg total) by mouth daily., Disp: 90 tablet, Rfl: 3   traZODone (DESYREL) 100 MG tablet, TAKE 1 TABLET BY MOUTH EVERYDAY AT BEDTIME, Disp: 90 tablet, Rfl: 3   Allergies  Allergen Reactions   Cortisone Nausea And Vomiting and Swelling   Elemental Sulfur Nausea And Vomiting and Swelling   Past Medical History:  Diagnosis Date   Asthma    Bipolar 1 disorder ([place])    Depression    Hypertension    Migraine    Stroke ([place]) 2016   2 strokes   Thyroid  disease     Past Surgical History:  Procedure Laterality Date   CESAREAN SECTION  2005   CESAREAN SECTION  2006   CHOLECYSTECTOMY  2014   COLONOSCOPY WITH PROPOFOL N/A 03/01/2020   Procedure: COLONOSCOPY WITH PROPOFOL;  Surgeon: Daneil Dolin, MD;  [facility]: [facility];  Service: Endoscopy;  Laterality: N/A;  2:00pm   THYROIDECTOMY  2013   TOTAL ABDOMINAL HYSTERECTOMY  2010    Social History   Socioeconomic History   Marital status: Married    Spouse name: Not on file   Number of children: Not on file   Years of education: Not on file   Highest education level: Not on file  Occupational History   Not on file  Tobacco Use   Smoking status: Former    Types: Cigarettes    Quit date: 1984    Years since quitting: 39.1   Smokeless tobacco: Never  Vaping Use   Vaping Use: Never used  Substance and Sexual Activity   Alcohol use: Yes    Comment: occ- 1-2 times a month.    Drug use: Never   Sexual activity: Yes    Birth control/protection: None  Other Topics Concern   Not on file  Social History Narrative   Not on file   Social Determinants of Health   Financial Resource Strain: Not on file  Food Insecurity: Not on file  Transportation Needs: Not on file  Physical Activity: Not on file  Stress: Not on file  Social Connections: Not on file  Intimate Partner Violence: Not on file        Objective:    BP (!) 154/74    Pulse 97    Temp 97.8 F (36.6 C) (Temporal)    Ht '5\' 6"'  (1.676 m)    Wt 267 lb 12.8 oz (121.5 kg)    SpO2 98%    BMI 43.22 kg/m   Wt Readings from Last 3 Encounters:  09/01/21 267 lb 12.8 oz (121.5 kg)  03/31/21 272 lb 3.2 oz (123.5 kg)  02/16/21 275 lb (124.7 kg)    Physical Exam Vitals reviewed.  Constitutional:      General: She is not in acute distress.    Appearance: Normal appearance. She is not ill-appearing, toxic-appearing or diaphoretic.  HENT:     Head: Normocephalic and atraumatic.  Eyes:     General: No scleral icterus.        Right eye: No discharge.        Left eye: No discharge.     Conjunctiva/sclera: Conjunctivae normal.  Cardiovascular:     Rate and Rhythm: Normal rate.  Pulmonary:     Effort: Pulmonary effort is normal. No respiratory distress.  Musculoskeletal:        General: Normal range of motion.     Cervical back: Normal range of motion.     Right  knee: No deformity, effusion, erythema, ecchymosis or lacerations. Tenderness present over the medial joint line and lateral joint line.     Instability Tests: Anterior drawer test negative. Posterior drawer test negative. Anterior Lachman test negative. Medial McMurray test negative and lateral McMurray test negative.  Skin:    General: Skin is warm and dry.     Capillary Refill: Capillary refill takes less than 2 seconds.  Neurological:     General: No focal deficit present.     Mental Status: She is alert and oriented to person, place, and time. Mental status is at baseline.  Psychiatric:        Mood and Affect: Mood normal.        Behavior: Behavior normal.        Thought Content: Thought content normal.        Judgment: Judgment normal.   Consent form signed to be scanned into her chart.   Joint Injection/Arthrocentesis  Date/Time: 09/01/2021 8:35 AM Performed by: Loman Brooklyn, FNP Authorized by: Loman Brooklyn, FNP  Indications: pain  Body area: knee Local anesthesia used: yes  Anesthesia: Local anesthesia used: yes Local Anesthetic: lidocaine 2% without epinephrine and topical anesthetic  Sedation: Patient sedated: no  Preparation: Patient was prepped and draped in the usual sterile fashion. Needle gauge: 25G. Ultrasound guidance: no Approach: medial Methylprednisolone amount: 40 mg Lidocaine 2% amount: 3 mL Patient tolerance: patient tolerated the procedure well with no immediate complications     Lab Results  Component Value Date   TSH 2.050 03/31/2021   Lab Results  Component Value Date   WBC 8.5  03/31/2021   HGB 13.9 03/31/2021   HCT 42.4 03/31/2021   MCV 84 03/31/2021   PLT 289 03/31/2021   Lab Results  Component Value Date   NA 140 03/31/2021   K 4.5 03/31/2021   CO2 23 03/31/2021   GLUCOSE 81 03/31/2021   BUN 11 03/31/2021   CREATININE 0.98 03/31/2021   BILITOT 0.8 03/31/2021   ALKPHOS 102 03/31/2021   AST 22 03/31/2021   ALT 19 03/31/2021   PROT 6.6 03/31/2021   ALBUMIN 4.5 03/31/2021   CALCIUM 9.9 03/31/2021   EGFR 68 03/31/2021   Lab Results  Component Value Date   CHOL 137 03/31/2021   Lab Results  Component Value Date   HDL 40 03/31/2021   Lab Results  Component Value Date   LDLCALC 79 03/31/2021   Lab Results  Component Value Date   TRIG 93 03/31/2021   Lab Results  Component Value Date   CHOLHDL 3.4 03/31/2021   No results found for: HGBA1C

## 2021-09-02 ENCOUNTER — Other Ambulatory Visit: Payer: Self-pay | Admitting: Family Medicine

## 2021-09-02 DIAGNOSIS — Z1231 Encounter for screening mammogram for malignant neoplasm of breast: Secondary | ICD-10-CM

## 2021-09-04 ENCOUNTER — Encounter: Payer: Self-pay | Admitting: Family Medicine

## 2021-10-26 ENCOUNTER — Ambulatory Visit (HOSPITAL_COMMUNITY)
Admission: RE | Admit: 2021-10-26 | Discharge: 2021-10-26 | Disposition: A | Discharge status: Home / Self Care | Payer: 59 | Source: Ambulatory Visit | Attending: Family Medicine | Admitting: Family Medicine

## 2021-10-26 DIAGNOSIS — Z1231 Encounter for screening mammogram for malignant neoplasm of breast: Secondary | ICD-10-CM | POA: Diagnosis present

## 2021-10-27 ENCOUNTER — Other Ambulatory Visit (HOSPITAL_COMMUNITY): Payer: Self-pay | Admitting: Family Medicine

## 2021-10-27 DIAGNOSIS — R928 Other abnormal and inconclusive findings on diagnostic imaging of breast: Secondary | ICD-10-CM

## 2021-11-06 ENCOUNTER — Encounter: Payer: Self-pay | Admitting: Family Medicine

## 2021-11-08 ENCOUNTER — Encounter (HOSPITAL_COMMUNITY): Payer: Self-pay

## 2021-11-08 ENCOUNTER — Ambulatory Visit (HOSPITAL_COMMUNITY)
Admission: RE | Admit: 2021-11-08 | Discharge: 2021-11-08 | Disposition: A | Discharge status: Home / Self Care | Payer: 59 | Source: Ambulatory Visit | Attending: Family Medicine | Admitting: Family Medicine

## 2021-11-08 DIAGNOSIS — R928 Other abnormal and inconclusive findings on diagnostic imaging of breast: Secondary | ICD-10-CM | POA: Insufficient documentation

## 2021-11-15 ENCOUNTER — Encounter: Payer: Self-pay | Admitting: Family Medicine

## 2021-11-15 DIAGNOSIS — M1711 Unilateral primary osteoarthritis, right knee: Secondary | ICD-10-CM

## 2021-11-17 ENCOUNTER — Ambulatory Visit (HOSPITAL_COMMUNITY): Payer: 59

## 2021-11-17 ENCOUNTER — Encounter (HOSPITAL_COMMUNITY): Payer: 59

## 2021-11-29 ENCOUNTER — Ambulatory Visit (INDEPENDENT_AMBULATORY_CARE_PROVIDER_SITE_OTHER): Payer: 59

## 2021-11-29 ENCOUNTER — Ambulatory Visit: Payer: 59 | Admitting: Orthopedic Surgery

## 2021-11-29 ENCOUNTER — Encounter: Payer: Self-pay | Admitting: Orthopedic Surgery

## 2021-11-29 VITALS — BP 137/99 | Ht 66.0 in | Wt 260.0 lb

## 2021-11-29 DIAGNOSIS — M25561 Pain in right knee: Secondary | ICD-10-CM

## 2021-11-29 DIAGNOSIS — Z6841 Body Mass Index (BMI) 40.0 and over, adult: Secondary | ICD-10-CM

## 2021-11-29 DIAGNOSIS — M1711 Unilateral primary osteoarthritis, right knee: Secondary | ICD-10-CM | POA: Diagnosis not present

## 2021-11-29 NOTE — Progress Notes (Signed)
New Patient Visit ? ?Assessment: ?Belinda Turner is a 56 y.o. female with the following: ?1. Arthritis of right knee ? ?Plan: ?Lamia Mariner has advanced degenerative changes in the right knee, with mild varus alignment overall.  She has diffuse tenderness on physical exam.  Mild effusion.  NSAIDs have not provided sustained relief.  She is interested in a steroid injection.  This was completed in clinic today.  We briefly discussed knee replacement in the future, and advised her of the BMI requirements.  She states her understanding.  Follow-up as needed. ? ? ?Procedure note injection Right knee joint ?  ?Verbal consent was obtained to inject the right knee joint  ?Timeout was completed to confirm the site of injection.  The skin was prepped with alcohol and ethyl chloride was sprayed at the injection site.  ?A 21-gauge needle was used to inject 40 mg of Depo-Medrol and 1% lidocaine (3 cc) into the right knee using an anterolateral approach.  ?There were no complications. A sterile bandage was applied. ? ? ?The patient meets the AMA guidelines for Morbid obesity with BMI > 40.  The patient has been counseled on weight loss.  ? ? ?Follow-up: ?Return if symptoms worsen or fail to improve. ? ?Subjective: ? ?Chief Complaint  ?Patient presents with  ? Knee Pain  ?  RT knee/ painful for years but the last 4 months pain has worsened. Pt states knee "popped" 4 months ago while she was walking and has been hurting ever since  ? New Patient (Initial Visit)  ? ? ?History of Present Illness: ?Belinda Turner is a 56 y.o. female who has been referred by  Deliah Boston, FNP for evaluation of right knee pain.  She has had progressively worsening knee pain for several years.  It has gotten worse over the last couple of months.  She notes an episode of popping while walking, and it has been an issue ever since.  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  She has been taking medications as needed,  with limited improvement in her symptoms.  The pain is diffuse.  She has not had an injection.  No physical therapy. ? ? ?Review of Systems: ?No fevers or chills ?No numbness or tingling ?No chest pain ?No shortness of breath ?No bowel or bladder dysfunction ?No GI distress ?No headaches ? ? ?Medical History: ? ?Past Medical History:  ?Diagnosis Date  ? Asthma   ? Bipolar 1 disorder ([facility])   ? Depression   ? Hypertension   ? Migraine   ? Stroke [facility]) 2016  ? 2 strokes  ? Thyroid disease   ? ? ?Past Surgical History:  ?Procedure Laterality Date  ? CESAREAN SECTION  2005  ? CESAREAN SECTION  2006  ? CHOLECYSTECTOMY  2014  ? COLONOSCOPY WITH PROPOFOL N/A 03/01/2020  ? Procedure: COLONOSCOPY WITH PROPOFOL;  Surgeon: Corbin Ade, MD;  [facility];  Service: Endoscopy;  Laterality: N/A;  2:00pm  ? THYROIDECTOMY  2013  ? TOTAL ABDOMINAL HYSTERECTOMY  2010  ? ? ?Family History  ?Problem Relation Age of Onset  ? Depression Mother   ? Thyroid disease Mother   ? Diabetes Father   ? Thyroid disease Sister   ? ADD / ADHD Son   ? Autism Son   ? Bipolar disorder Son   ? Thyroid disease Maternal Grandmother   ? Breast cancer Maternal Grandmother   ? Diabetes Paternal Grandmother   ?  Diabetes Paternal Grandfather   ? Colon cancer Neg Hx   ? ?Social History  ? ?Tobacco Use  ? Smoking status: Former  ?  Types: Cigarettes  ?  Quit date: 73  ?  Years since quitting: 39.3  ? Smokeless tobacco: Never  ?Vaping Use  ? Vaping Use: Never used  ?Substance Use Topics  ? Alcohol use: Yes  ?  Comment: occ- 1-2 times a month.   ? Drug use: Never  ? ? ?Allergies  ?Allergen Reactions  ? Cortisone Nausea And Vomiting and Swelling  ? Elemental Sulfur Nausea And Vomiting and Swelling  ? ? ?Current Meds  ?Medication Sig  ? albuterol (VENTOLIN HFA) 108 (90 Base) MCG/ACT inhaler Inhale 2 puffs into the lungs every 6 (six) hours as needed for wheezing or shortness of breath.   ? buPROPion (WELLBUTRIN XL) 300 MG 24 hr tablet Take 1 tablet  (300 mg total) by mouth daily.  ? cetirizine (ZYRTEC ALLERGY) 10 MG tablet Take 1 tablet (10 mg total) by mouth daily.  ? diclofenac Sodium (VOLTAREN) 1 % GEL Apply 2 g topically 4 (four) times daily.  ? glucosamine-chondroitin 500-400 MG tablet Take 2 tablets by mouth daily.  ? hydrOXYzine (ATARAX/VISTARIL) 25 MG tablet Take 1 tablet (25 mg total) by mouth in the morning and at bedtime.  ? levothyroxine (SYNTHROID) 150 MCG tablet TAKE 1 TABLET BY MOUTH DAILY BEFORE BREAKFAST.  ? lisinopril (ZESTRIL) 20 MG tablet TAKE 1 TABLET BY MOUTH EVERY DAY  ? magnesium oxide (MAG-OX) 400 MG tablet Take 400 mg by mouth daily.  ? Multiple Vitamin (MULTIVITAMIN) tablet Take 1 tablet by mouth daily.  ? rosuvastatin (CRESTOR) 5 MG tablet Take 1 tablet (5 mg total) by mouth daily.  ? traZODone (DESYREL) 100 MG tablet TAKE 1 TABLET BY MOUTH EVERYDAY AT BEDTIME  ? ? ?Objective: ?BP (!) 137/99   Ht 5\' 6"  (1.676 m)   Wt 260 lb (117.9 kg)   BMI 41.97 kg/m?  ? ?Physical Exam: ? ?General: Alert and oriented. and No acute distress. ?Gait: Right sided antalgic gait. ? ?Right knee with mild effusion.  Diffuse tenderness to palpation over the medial, lateral and posterior aspects of the knee.  Range of motion from 3-90 degrees, but this is painful.  No increased laxity to varus or valgus stress.  Negative Lachman. ? ? ?IMAGING: ?I personally ordered and reviewed the following images ? ?X-rays of the right knee were obtained in clinic today.  No acute injuries are noted.  Mild varus alignment overall.  Complete loss of joint space within the medial compartment.  Small osteophytes are noted both medial and lateral.  Mild loss of joint space within the patellofemoral compartment, with osteophytes both medially and laterally. ? ?Impression: Moderate to severe right knee degenerative changes, most prominent within the medial compartment. ? ?New Medications:  ?No orders of the defined types were placed in this encounter. ? ? ? ? ? ,  MD ? ?11/29/2021 ?10:03 AM ? ? ?

## 2021-11-29 NOTE — Patient Instructions (Signed)
Instructions Following Joint Injections ? ?In clinic today, you received an injection in one of your joints (sometimes more than one).  Occasionally, you can have some pain at the injection site, this is normal.  You can place ice at the injection site, or take over-the-counter medications such as Tylenol (acetaminophen) or Advil (ibuprofen).  Please follow all directions listed on the bottle. ? ?If your joint (knee or shoulder) becomes swollen, red or very painful, please contact the clinic for additional assistance.  ? ?Two medications were injected, including lidocaine and a steroid (often referred to as cortisone).  Lidocaine is effective almost immediately but wears off quickly.  However, the steroid can take a few days to improve your symptoms.  In some cases, it can make your pain worse for a couple of days.  Do not be concerned if this happens as it is common.  You can apply ice or take some over-the-counter medications as needed.  ? ? ? ? ?Knee Exercises ? ?Ask your health care provider which exercises are safe for you. Do exercises exactly as told by your health care provider and adjust them as directed. It is normal to feel mild stretching, pulling, tightness, or discomfort as you do these exercises. Stop right away if you feel sudden pain or your pain gets worse. Do not begin these exercises until told by your health care provider. ? ?Stretching and range-of-motion exercises ?These exercises warm up your muscles and joints and improve the movement and flexibility of your knee. These exercises also help to relieve pain and swelling. ? ?Knee extension, prone ?Lie on your abdomen (prone position) on a bed. ?Place your left / right knee just beyond the edge of the surface so your knee is not on the bed. You can put a towel under your left / right thigh just above your kneecap for comfort. ?Relax your leg muscles and allow gravity to straighten your knee (extension). You should feel a stretch behind your left  / right knee. ?Hold this position for 10 seconds. ?Scoot up so your knee is supported between repetitions. ?Repeat 10 times. Complete this exercise 3-4 times per week. ?    ?Knee flexion, active ?Lie on your back with both legs straight. If this causes back discomfort, bend your left / right knee so your foot is flat on the floor. ?Slowly slide your left / right heel back toward your buttocks. Stop when you feel a gentle stretch in the front of your knee or thigh (flexion). ?Hold this position for 10 seconds. ?Slowly slide your left / right heel back to the starting position. ?Repeat 10 times. Complete this exercise 3-4 times per week. ?  ?   ?Quadriceps stretch, prone ?Lie on your abdomen on a firm surface, such as a bed or padded floor. ?Bend your left / right knee and hold your ankle. If you cannot reach your ankle or pant leg, loop a belt around your foot and grab the belt instead. ?Gently pull your heel toward your buttocks. Your knee should not slide out to the side. You should feel a stretch in the front of your thigh and knee (quadriceps). ?Hold this position for 10 seconds. ?Repeat 10 times. Complete this exercise 3-4 times per week. ?  ?   ?Hamstring, supine ?Lie on your back (supine position). ?Loop a belt or towel over the ball of your left / right foot. The ball of your foot is on the walking surface, right under your toes. ?Straighten your left /  right knee and slowly pull on the belt to raise your leg until you feel a gentle stretch behind your knee (hamstring). ?Do not let your knee bend while you do this. ?Keep your other leg flat on the floor. ?Hold this position for 10 seconds. ?Repeat 10 times. Complete this exercise 3-4 times per week. ? ? ?Strengthening exercises ?These exercises build strength and endurance in your knee. Endurance is the ability to use your muscles for a long time, even after they get tired. ? ?Quadriceps, isometric ?This exercise stretches the muscles in front of your thigh  (quadriceps) without moving your knee joint (isometric). ?Lie on your back with your left / right leg extended and your other knee bent. Put a rolled towel or small pillow under your knee if told by your health care provider. ?Slowly tense the muscles in the front of your left / right thigh. You should see your kneecap slide up toward your hip or see increased dimpling just above the knee. This motion will push the back of the knee toward the floor. ?For 10 seconds, hold the muscle as tight as you can without increasing your pain. ?Relax the muscles slowly and completely. ?Repeat 10 times. Complete this exercise 3-4 times per week. ?.  ?   ?Straight leg raises ?This exercise stretches the muscles in front of your thigh (quadriceps) and the muscles that move your hips (hip flexors). ?Lie on your back with your left / right leg extended and your other knee bent. ?Tense the muscles in the front of your left / right thigh. You should see your kneecap slide up or see increased dimpling just above the knee. Your thigh may even shake a bit. ?Keep these muscles tight as you raise your leg 4-6 inches (10-15 cm) off the floor. Do not let your knee bend. ?Hold this position for 10 seconds. ?Keep these muscles tense as you lower your leg. ?Relax your muscles slowly and completely after each repetition. ?Repeat 10 times. Complete this exercise 3-4 times per week. ? ?Hamstring, isometric ?Lie on your back on a firm surface. ?Bend your left / right knee about 30 degrees. ?Dig your left / right heel into the surface as if you are trying to pull it toward your buttocks. Tighten the muscles in the back of your thighs (hamstring) to "dig" as hard as you can without increasing any pain. ?Hold this position for 10 seconds. ?Release the tension gradually and allow your muscles to relax completely for __________ seconds after each repetition. ?Repeat 10 times. Complete this exercise 3-4 times per week. ? ?Hamstring curls ?If told by your  health care provider, do this exercise while wearing ankle weights. Begin with 5 lb weights. Then increase the weight by 1 lb (0.5 kg) increments. You can also use an exercise band ?Lie on your abdomen with your legs straight. ?Bend your left / right knee as far as you can without feeling pain. Keep your hips flat against the floor. ?Hold this position for 10 seconds. ?Slowly lower your leg to the starting position. ?Repeat 10 times. Complete this exercise 3-4 times per week. ?  ?   ?Squats ?This exercise strengthens the muscles in front of your thigh and knee (quadriceps). ?Stand in front of a table, with your feet and knees pointing straight ahead. You may rest your hands on the table for balance but not for support. ?Slowly bend your knees and lower your hips like you are going to sit in a chair. ?Keep  your weight over your heels, not over your toes. ?Keep your lower legs upright so they are parallel with the table legs. ?Do not let your hips go lower than your knees. ?Do not bend lower than told by your health care provider. ?If your knee pain increases, do not bend as low. ?Hold the squat position for 10 seconds. ?Slowly push with your legs to return to standing. Do not use your hands to pull yourself to standing. ?Repeat 10 times. Complete this exercise 3-4 times per week ?. ?    ?Wall slides ?This exercise strengthens the muscles in front of your thigh and knee (quadriceps). ?Lean your back against a smooth wall or door, and walk your feet out 18-24 inches (46-61 cm) from it. ?Place your feet hip-width apart. ?Slowly slide down the wall or door until your knees bend 90 degrees. Keep your knees over your heels, not over your toes. Keep your knees in line with your hips. ?Hold this position for 10 seconds. ?Repeat 10 times. Complete this exercise 3-4 times per week. ?  ?   ?Straight leg raises ?This exercise strengthens the muscles that rotate the leg at the hip and move it away from your body (hip  abductors). ?Lie on your side with your left / right leg in the top position. Lie so your head, shoulder, knee, and hip line up. You may bend your bottom knee to help you keep your balance. ?Roll your hips slight

## 2021-12-06 ENCOUNTER — Other Ambulatory Visit: Payer: Self-pay | Admitting: Family Medicine

## 2021-12-06 DIAGNOSIS — I10 Essential (primary) hypertension: Secondary | ICD-10-CM

## 2021-12-16 ENCOUNTER — Encounter: Payer: Self-pay | Admitting: Family Medicine

## 2021-12-20 ENCOUNTER — Encounter: Payer: Self-pay | Admitting: Family Medicine

## 2021-12-29 ENCOUNTER — Encounter: Payer: Self-pay | Admitting: Family Medicine

## 2021-12-29 ENCOUNTER — Ambulatory Visit (INDEPENDENT_AMBULATORY_CARE_PROVIDER_SITE_OTHER): Payer: 59 | Admitting: Family Medicine

## 2021-12-29 VITALS — BP 121/78 | HR 110 | Temp 98.8°F | Ht 66.0 in | Wt 269.0 lb

## 2021-12-29 DIAGNOSIS — J069 Acute upper respiratory infection, unspecified: Secondary | ICD-10-CM | POA: Diagnosis not present

## 2021-12-29 MED ORDER — PREDNISONE 20 MG PO TABS: ORAL_TABLET | ORAL | 0 refills | Status: DC

## 2021-12-29 MED ORDER — ALBUTEROL SULFATE HFA 108 (90 BASE) MCG/ACT IN AERS: 2.0000 | INHALATION_SPRAY | Freq: Four times a day (QID) | RESPIRATORY_TRACT | 2 refills | Status: DC | PRN

## 2021-12-29 MED ORDER — AMOXICILLIN-POT CLAVULANATE 875-125 MG PO TABS: 1.0000 | ORAL_TABLET | Freq: Two times a day (BID) | ORAL | 0 refills | Status: AC

## 2021-12-29 MED ORDER — FLUTICASONE PROPIONATE 50 MCG/ACT NA SUSP: 2.0000 | Freq: Every day | NASAL | 6 refills | Status: DC

## 2021-12-29 NOTE — Progress Notes (Signed)
Subjective:  Patient ID: Belinda Turner, female    DOB: 1966-03-21, 56 y.o.   MRN: [id number]  Patient Care Team: Gwenlyn Fudge, FNP as PCP - General (Family Medicine)   Chief Complaint:  Cough (rhinorrhea)   HPI: Belinda Turner is a 56 y.o. female presenting on 12/29/2021 for Cough (rhinorrhea)   Cough This is a new problem. The current episode started more than 1 month ago. The problem has been waxing and waning. The cough is Productive of sputum. Associated symptoms include chills, nasal congestion, rhinorrhea and wheezing. Pertinent negatives include no chest pain, ear congestion, ear pain, fever, headaches, heartburn, hemoptysis, myalgias, postnasal drip, rash, sore throat, shortness of breath, sweats or weight loss. Nothing aggravates the symptoms. She has tried rest for the symptoms. The treatment provided no relief.  URI  This is a new problem. The current episode started more than 1 month ago. The problem has been waxing and waning. Associated symptoms include congestion, coughing, a plugged ear sensation, rhinorrhea and wheezing. Pertinent negatives include no abdominal pain, chest pain, diarrhea, dysuria, ear pain, headaches, joint pain, joint swelling, nausea, neck pain, rash, sinus pain, sneezing, sore throat, swollen glands or vomiting. She has tried antihistamine and decongestant for the symptoms. The treatment provided no relief.     Relevant past medical, surgical, family, and social history reviewed and updated as indicated.  Allergies and medications reviewed and updated. Data reviewed: Chart in Epic.   Past Medical History:  Diagnosis Date   Asthma    Bipolar 1 disorder (HCC)    Depression    Hypertension    Migraine    Stroke (HCC) 2016   2 strokes   Thyroid disease     Past Surgical History:  Procedure Laterality Date   CESAREAN SECTION  2005   CESAREAN SECTION  2006   CHOLECYSTECTOMY  2014   COLONOSCOPY WITH PROPOFOL N/A 03/01/2020    Procedure: COLONOSCOPY WITH PROPOFOL;  Surgeon: Corbin Ade, MD;  Location: [facility];  Service: Endoscopy;  Laterality: N/A;  2:00pm   THYROIDECTOMY  2013   TOTAL ABDOMINAL HYSTERECTOMY  2010    Social History   Socioeconomic History   Marital status: Married    Spouse name: Not on file   Number of children: Not on file   Years of education: Not on file   Highest education level: Not on file  Occupational History   Not on file  Tobacco Use   Smoking status: Former    Types: Cigarettes    Quit date: 1984    Years since quitting: 39.4   Smokeless tobacco: Never  Vaping Use   Vaping Use: Never used  Substance and Sexual Activity   Alcohol use: Yes    Comment: occ- 1-2 times a month.    Drug use: Never   Sexual activity: Yes    Birth control/protection: None  Other Topics Concern   Not on file  Social History Narrative   Not on file   Social Determinants of Health   Financial Resource Strain: Not on file  Food Insecurity: Not on file  Transportation Needs: Not on file  Physical Activity: Not on file  Stress: Not on file  Social Connections: Not on file  Intimate Partner Violence: Not on file    Outpatient Encounter Medications as of 12/29/2021  Medication Sig   albuterol (VENTOLIN HFA) 108 (90 Base) MCG/ACT inhaler Inhale 2 puffs into the lungs every 6 (six) hours as needed  for wheezing or shortness of breath.   amoxicillin-clavulanate (AUGMENTIN) 875-125 MG tablet Take 1 tablet by mouth 2 (two) times daily for 10 days.   buPROPion (WELLBUTRIN XL) 300 MG 24 hr tablet Take 1 tablet (300 mg total) by mouth daily.   cetirizine (ZYRTEC ALLERGY) 10 MG tablet Take 1 tablet (10 mg total) by mouth daily.   Chromium-Cinnamon (CINNAMON PLUS CHROMIUM) [phone] MCG-MG CAPS Take 1,000 mcg by mouth daily.   diclofenac Sodium (VOLTAREN) 1 % GEL Apply 2 g topically 4 (four) times daily.   fluticasone (FLONASE) 50 MCG/ACT nasal spray Place 2 sprays into both nostrils daily.    glucosamine-chondroitin 500-400 MG tablet Take 2 tablets by mouth daily.   hydrOXYzine (ATARAX/VISTARIL) 25 MG tablet Take 1 tablet (25 mg total) by mouth in the morning and at bedtime.   influenza vac split quadrivalent PF (FLUARIX) 0.5 ML injection Inject into the muscle.   levothyroxine (SYNTHROID) 150 MCG tablet TAKE 1 TABLET BY MOUTH DAILY BEFORE BREAKFAST.   lisinopril (ZESTRIL) 20 MG tablet Take 1 tablet (20 mg total) by mouth daily.   magnesium oxide (MAG-OX) 400 MG tablet Take 400 mg by mouth daily.   Multiple Vitamin (MULTIVITAMIN) tablet Take 1 tablet by mouth daily.   predniSONE (DELTASONE) 20 MG tablet 2 po at sametime daily for 5 days- start tomorrow   rosuvastatin (CRESTOR) 5 MG tablet Take 1 tablet (5 mg total) by mouth daily.   traZODone (DESYREL) 100 MG tablet TAKE 1 TABLET BY MOUTH EVERYDAY AT BEDTIME   [DISCONTINUED] albuterol (VENTOLIN HFA) 108 (90 Base) MCG/ACT inhaler Inhale 2 puffs into the lungs every 6 (six) hours as needed for wheezing or shortness of breath.    No facility-administered encounter medications on file as of 12/29/2021.    Allergies  Allergen Reactions   Cortisone Nausea And Vomiting and Swelling   Elemental Sulfur Nausea And Vomiting and Swelling    Review of Systems  Constitutional:  Positive for activity change, appetite change and chills. Negative for diaphoresis, fatigue, fever, unexpected weight change and weight loss.  HENT:  Positive for congestion and rhinorrhea. Negative for ear pain, postnasal drip, sinus pain, sneezing, sore throat, trouble swallowing and voice change.   Eyes: Negative.   Respiratory:  Positive for cough and wheezing. Negative for hemoptysis, chest tightness and shortness of breath.   Cardiovascular:  Negative for chest pain, palpitations and leg swelling.  Gastrointestinal:  Negative for abdominal pain, blood in stool, constipation, diarrhea, heartburn, nausea and vomiting.  Endocrine: Negative.   Genitourinary:   Negative for decreased urine volume, difficulty urinating, dysuria, frequency and urgency.  Musculoskeletal:  Negative for arthralgias, joint pain, myalgias and neck pain.  Skin: Negative.  Negative for rash.  Allergic/Immunologic: Negative.   Neurological:  Negative for dizziness, tremors, seizures, syncope, facial asymmetry, speech difficulty, weakness, light-headedness, numbness and headaches.  Hematological: Negative.   Psychiatric/Behavioral:  Negative for confusion, hallucinations, sleep disturbance and suicidal ideas.   All other systems reviewed and are negative.       Objective:  BP 121/78   Pulse (!) 110   Temp 98.8 F (37.1 C)   Ht 5\' 6"  (1.676 m)   Wt 269 lb (122 kg)   SpO2 94%   BMI 43.42 kg/m    Wt Readings from Last 3 Encounters:  12/29/21 269 lb (122 kg)  11/29/21 260 lb (117.9 kg)  09/01/21 267 lb 12.8 oz (121.5 kg)    Physical Exam Vitals and nursing note reviewed.  Constitutional:  General: She is not in acute distress.    Appearance: Normal appearance. She is well-developed and well-groomed. She is morbidly obese. She is not ill-appearing, toxic-appearing or diaphoretic.  HENT:     Head: Normocephalic and atraumatic.     Jaw: There is normal jaw occlusion.     Right Ear: Hearing normal. A middle ear effusion is present. Tympanic membrane is not erythematous.     Left Ear: Hearing normal. A middle ear effusion is present. Tympanic membrane is not erythematous.     Nose: Congestion present.     Mouth/Throat:     Lips: Pink.     Mouth: Mucous membranes are moist.     Pharynx: Oropharynx is clear. Uvula midline. Posterior oropharyngeal erythema present. No oropharyngeal exudate.  Eyes:     General: Lids are normal.     Extraocular Movements: Extraocular movements intact.     Conjunctiva/sclera: Conjunctivae normal.     Pupils: Pupils are equal, round, and reactive to light.  Neck:     Thyroid: No thyroid mass, thyromegaly or thyroid tenderness.      Vascular: No carotid bruit or JVD.     Trachea: Trachea and phonation normal.  Cardiovascular:     Rate and Rhythm: Normal rate and regular rhythm.     Chest Wall: PMI is not displaced.     Pulses: Normal pulses.     Heart sounds: Normal heart sounds. No murmur heard.    No friction rub. No gallop.  Pulmonary:     Effort: Pulmonary effort is normal. No respiratory distress.     Breath sounds: No stridor. Wheezing present. No rhonchi or rales.     Comments: Dry cough Chest:     Chest wall: No tenderness.  Abdominal:     General: Bowel sounds are normal. There is no distension or abdominal bruit.     Palpations: Abdomen is soft. There is no hepatomegaly or splenomegaly.     Tenderness: There is no abdominal tenderness. There is no right CVA tenderness or left CVA tenderness.     Hernia: No hernia is present.  Musculoskeletal:        General: Normal range of motion.     Cervical back: Normal range of motion and neck supple.     Right lower leg: No edema.     Left lower leg: No edema.  Lymphadenopathy:     Cervical: No cervical adenopathy.  Skin:    General: Skin is warm and dry.     Capillary Refill: Capillary refill takes less than 2 seconds.     Coloration: Skin is not cyanotic, jaundiced or pale.     Findings: No rash.  Neurological:     General: No focal deficit present.     Mental Status: She is alert and oriented to person, place, and time.     Sensory: Sensation is intact.     Motor: Motor function is intact.     Coordination: Coordination is intact.     Gait: Gait is intact.     Deep Tendon Reflexes: Reflexes are normal and symmetric.  Psychiatric:        Attention and Perception: Attention and perception normal.        Mood and Affect: Mood and affect normal.        Speech: Speech normal.        Behavior: Behavior normal. Behavior is cooperative.        Thought Content: Thought content normal.  Cognition and Memory: Cognition and memory normal.         Judgment: Judgment normal.     Results for orders placed or performed in visit on 05/16/21  Novel Coronavirus, NAA ([facility])   Specimen: Nasopharyngeal(NP) swabs in vial transport medium  Result Value Ref Range   SARS-CoV-2, NAA Not Detected Not Detected  SARS-COV-2, NAA 2 DAY TAT  Result Value Ref Range   SARS-CoV-2, NAA 2 DAY TAT Performed   Veritor Flu A/B Waived  Result Value Ref Range   Influenza A Negative Negative   Influenza B Negative Negative       Pertinent labs & imaging results that were available during my care of the patient were reviewed by me and considered in my medical decision making.  Assessment & Plan:  Belinda ClockKristina was seen today for cough.  Diagnoses and all orders for this visit:  URI with cough and congestion Ongoing and worsening symptoms for over 4 weeks. Will treat with Augmentin, Flonsae, Albuterol, and Prednisone. Pt states she is able to tolerate Flonsae and prednisone, not cortisone. Symptomatic care discussed in detail. Report any new, worsening, or persistent symptoms.  -     amoxicillin-clavulanate (AUGMENTIN) 875-125 MG tablet; Take 1 tablet by mouth 2 (two) times daily for 10 days. -     predniSONE (DELTASONE) 20 MG tablet; 2 po at sametime daily for 5 days- start tomorrow -     fluticasone (FLONASE) 50 MCG/ACT nasal spray; Place 2 sprays into both nostrils daily. -     albuterol (VENTOLIN HFA) 108 (90 Base) MCG/ACT inhaler; Inhale 2 puffs into the lungs every 6 (six) hours as needed for wheezing or shortness of breath.     Continue all other maintenance medications.  Follow up plan: Return if symptoms worsen or fail to improve.   Continue healthy lifestyle choices, including diet (rich in fruits, vegetables, and lean proteins, and low in salt and simple carbohydrates) and exercise (at least 30 minutes of moderate physical activity daily).  Educational handout given for URI  The above assessment and management plan was discussed with the  patient. The patient verbalized understanding of and has agreed to the management plan. Patient is aware to call the clinic if they develop any new symptoms or if symptoms persist or worsen. Patient is aware when to return to the clinic for a follow-up visit. Patient educated on when it is appropriate to go to the emergency department.   Belinda BaarsMichelle Marcelo Ickes, FNP-C [facility] [phone]

## 2022-01-09 ENCOUNTER — Encounter: Payer: Self-pay | Admitting: Family Medicine

## 2022-01-09 ENCOUNTER — Other Ambulatory Visit: Payer: Self-pay | Admitting: Nurse Practitioner

## 2022-01-09 DIAGNOSIS — B3731 Acute candidiasis of vulva and vagina: Secondary | ICD-10-CM

## 2022-01-09 MED ORDER — FLUCONAZOLE 150 MG PO TABS: 150.0000 mg | ORAL_TABLET | Freq: Once | ORAL | 0 refills | Status: AC

## 2022-01-09 NOTE — Telephone Encounter (Signed)
Medication sent to pharmacy  

## 2022-01-10 ENCOUNTER — Encounter: Payer: Self-pay | Admitting: Family Medicine

## 2022-01-12 ENCOUNTER — Encounter: Payer: Self-pay | Admitting: Family Medicine

## 2022-01-13 ENCOUNTER — Telehealth: Payer: 59 | Admitting: Physician Assistant

## 2022-01-13 DIAGNOSIS — R21 Rash and other nonspecific skin eruption: Secondary | ICD-10-CM

## 2022-01-17 ENCOUNTER — Encounter: Payer: Self-pay | Admitting: Family Medicine

## 2022-01-17 ENCOUNTER — Ambulatory Visit (INDEPENDENT_AMBULATORY_CARE_PROVIDER_SITE_OTHER): Payer: 59

## 2022-01-17 ENCOUNTER — Ambulatory Visit (INDEPENDENT_AMBULATORY_CARE_PROVIDER_SITE_OTHER): Payer: 59 | Admitting: Family Medicine

## 2022-01-17 VITALS — BP 129/79 | HR 91 | Temp 97.8°F | Resp 20 | Ht 66.0 in | Wt 271.0 lb

## 2022-01-17 DIAGNOSIS — E890 Postprocedural hypothyroidism: Secondary | ICD-10-CM

## 2022-01-17 DIAGNOSIS — L239 Allergic contact dermatitis, unspecified cause: Secondary | ICD-10-CM | POA: Diagnosis not present

## 2022-01-17 DIAGNOSIS — R059 Cough, unspecified: Secondary | ICD-10-CM

## 2022-01-17 MED ORDER — MONTELUKAST SODIUM 10 MG PO TABS: 10.0000 mg | ORAL_TABLET | Freq: Every day | ORAL | 3 refills | Status: DC

## 2022-01-17 MED ORDER — TRIAMCINOLONE ACETONIDE 0.1 % EX CREA: 1.0000 | TOPICAL_CREAM | Freq: Two times a day (BID) | CUTANEOUS | 0 refills | Status: DC

## 2022-01-18 ENCOUNTER — Ambulatory Visit: Payer: 59 | Admitting: Family Medicine

## 2022-01-18 ENCOUNTER — Other Ambulatory Visit: Payer: Self-pay | Admitting: Family Medicine

## 2022-01-18 ENCOUNTER — Encounter: Payer: Self-pay | Admitting: Orthopedic Surgery

## 2022-01-18 DIAGNOSIS — F325 Major depressive disorder, single episode, in full remission: Secondary | ICD-10-CM

## 2022-01-18 DIAGNOSIS — F319 Bipolar disorder, unspecified: Secondary | ICD-10-CM

## 2022-01-18 DIAGNOSIS — J302 Other seasonal allergic rhinitis: Secondary | ICD-10-CM

## 2022-01-18 DIAGNOSIS — Z8673 Personal history of transient ischemic attack (TIA), and cerebral infarction without residual deficits: Secondary | ICD-10-CM

## 2022-01-18 LAB — CBC WITH DIFFERENTIAL/PLATELET
Basophils Absolute: 0.1 10*3/uL (ref 0.0–0.2)
Basos: 1 %
EOS (ABSOLUTE): 0.2 10*3/uL (ref 0.0–0.4)
Eos: 2 %
Hematocrit: 41.9 % (ref 34.0–46.6)
Hemoglobin: 13.7 g/dL (ref 11.1–15.9)
Immature Grans (Abs): 0 10*3/uL (ref 0.0–0.1)
Immature Granulocytes: 0 %
Lymphocytes Absolute: 2.7 10*3/uL (ref 0.7–3.1)
Lymphs: 33 %
MCH: 28 pg (ref 26.6–33.0)
MCHC: 32.7 g/dL (ref 31.5–35.7)
MCV: 86 fL (ref 79–97)
Monocytes Absolute: 1 10*3/uL — ABNORMAL HIGH (ref 0.1–0.9)
Monocytes: 12 %
Neutrophils Absolute: 4.2 10*3/uL (ref 1.4–7.0)
Neutrophils: 52 %
Platelets: 230 10*3/uL (ref 150–450)
RBC: 4.89 x10E6/uL (ref 3.77–5.28)
RDW: 15 % (ref 11.7–15.4)
WBC: 8.1 10*3/uL (ref 3.4–10.8)

## 2022-01-18 LAB — THYROID PANEL WITH TSH
Free Thyroxine Index: 3.8 (ref 1.2–4.9)
T3 Uptake Ratio: 31 % (ref 24–39)
T4, Total: 12.3 ug/dL — ABNORMAL HIGH (ref 4.5–12.0)
TSH: 0.028 u[IU]/mL — ABNORMAL LOW (ref 0.450–4.500)

## 2022-01-18 MED ORDER — LEVOTHYROXINE SODIUM 125 MCG PO TABS: 125.0000 ug | ORAL_TABLET | Freq: Every day | ORAL | 3 refills | Status: DC

## 2022-01-18 NOTE — Addendum Note (Signed)
Addended by: Sonny Masters on: 01/18/2022 07:56 AM   Modules accepted: Orders

## 2022-01-19 ENCOUNTER — Other Ambulatory Visit: Payer: Self-pay | Admitting: Family Medicine

## 2022-01-19 DIAGNOSIS — F325 Major depressive disorder, single episode, in full remission: Secondary | ICD-10-CM

## 2022-01-19 DIAGNOSIS — F319 Bipolar disorder, unspecified: Secondary | ICD-10-CM

## 2022-01-19 DIAGNOSIS — Z8673 Personal history of transient ischemic attack (TIA), and cerebral infarction without residual deficits: Secondary | ICD-10-CM

## 2022-01-23 ENCOUNTER — Encounter: Payer: Self-pay | Admitting: Family Medicine

## 2022-02-11 ENCOUNTER — Other Ambulatory Visit: Payer: Self-pay | Admitting: Family Medicine

## 2022-02-11 DIAGNOSIS — L239 Allergic contact dermatitis, unspecified cause: Secondary | ICD-10-CM

## 2022-02-14 ENCOUNTER — Encounter: Payer: Self-pay | Admitting: Family Medicine

## 2022-02-14 ENCOUNTER — Ambulatory Visit (INDEPENDENT_AMBULATORY_CARE_PROVIDER_SITE_OTHER): Payer: 59 | Admitting: Family Medicine

## 2022-02-14 VITALS — BP 124/78 | HR 78 | Temp 98.1°F | Ht 66.0 in | Wt 273.6 lb

## 2022-02-14 DIAGNOSIS — L309 Dermatitis, unspecified: Secondary | ICD-10-CM | POA: Diagnosis not present

## 2022-02-14 DIAGNOSIS — M1711 Unilateral primary osteoarthritis, right knee: Secondary | ICD-10-CM | POA: Diagnosis not present

## 2022-02-14 MED ORDER — DICLOFENAC SODIUM 75 MG PO TBEC: 75.0000 mg | DELAYED_RELEASE_TABLET | Freq: Two times a day (BID) | ORAL | 2 refills | Status: DC

## 2022-02-14 MED ORDER — BETAMETHASONE DIPROPIONATE 0.05 % EX CREA: TOPICAL_CREAM | Freq: Two times a day (BID) | CUTANEOUS | 2 refills | Status: DC

## 2022-02-14 NOTE — Progress Notes (Addendum)
Assessment & Plan:  1. Primary osteoarthritis of right knee Too soon for steroid knee injection and recommended against it if she has gotten no relief with previous injections. Encouraged to establish with ortho as they also have the option of gel injections. Started diclofenac and advised against using other NSAIDs, but she can still take Tylenol. - diclofenac (VOLTAREN) 75 MG EC tablet; Take 1 tablet (75 mg total) by mouth 2 (two) times daily.  Dispense: 60 tablet; Refill: 2  2. Eczema of both upper extremities Stop triamcinolone cream. Start betamethasone cream.  - betamethasone dipropionate 0.05 % cream; Apply topically 2 (two) times daily.  Dispense: 45 g; Refill: 2   Follow up plan: Return in about 2 months (around 04/17/2022) for annual physical.  Deliah Boston, MSN, APRN, FNP-C [facility]  Subjective:   Patient ID: Belinda Turner, female    DOB: Oct 13, 1965, 56 y.o.   MRN: [id number]  HPI: Belinda Turner is a 56 y.o. female presenting on 02/14/2022 for Knee Pain (Right x 6 months ) and dermatitis re check   Patient is here requesting a right knee injection. Her last was completed 11/29/2021 with the orthopedic and previously in February with me. She reports she did not get any relief with either injection. She is planning to get a knee replacement, but is waiting to schedule an appointment until her insurance changes on 02/21/2022. She is currently only taking Ibuprofen or Tylenol for pain which she does not find helpful.  She also has a rash that is not improving with triamcinolone cream.    ROS: Negative unless specifically indicated above in HPI.   Relevant past medical history reviewed and updated as indicated.   Allergies and medications reviewed and updated.   Current Outpatient Medications:    albuterol (VENTOLIN HFA) 108 (90 Base) MCG/ACT inhaler, Inhale 2 puffs into the lungs every 6 (six) hours as needed for wheezing or shortness of  breath., Disp: 8 g, Rfl: 2   buPROPion (WELLBUTRIN XL) 300 MG 24 hr tablet, TAKE 1 TABLET BY MOUTH EVERY DAY, Disp: 90 tablet, Rfl: 0   cetirizine (ZYRTEC) 10 MG tablet, TAKE 1 TABLET BY MOUTH EVERY DAY, Disp: 90 tablet, Rfl: 1   Chromium-Cinnamon (CINNAMON PLUS CHROMIUM) [phone] MCG-MG CAPS, Take 1,000 mcg by mouth daily., Disp: , Rfl:    diclofenac Sodium (VOLTAREN) 1 % GEL, Apply 2 g topically 4 (four) times daily., Disp: 350 g, Rfl: 3   fluticasone (FLONASE) 50 MCG/ACT nasal spray, Place 2 sprays into both nostrils daily., Disp: 16 g, Rfl: 6   glucosamine-chondroitin 500-400 MG tablet, Take 2 tablets by mouth daily., Disp: , Rfl:    hydrOXYzine (ATARAX) 25 MG tablet, TAKE 1 TABLET (25 MG TOTAL) BY MOUTH IN THE MORNING AND AT BEDTIME., Disp: 180 tablet, Rfl: 0   levothyroxine (SYNTHROID) 125 MCG tablet, Take 1 tablet (125 mcg total) by mouth daily., Disp: 90 tablet, Rfl: 3   lisinopril (ZESTRIL) 20 MG tablet, Take 1 tablet (20 mg total) by mouth daily., Disp: 90 tablet, Rfl: 1   magnesium oxide (MAG-OX) 400 MG tablet, Take 400 mg by mouth daily., Disp: , Rfl:    montelukast (SINGULAIR) 10 MG tablet, Take 1 tablet (10 mg total) by mouth at bedtime., Disp: 30 tablet, Rfl: 3   Multiple Vitamin (MULTIVITAMIN) tablet, Take 1 tablet by mouth daily., Disp: , Rfl:    rosuvastatin (CRESTOR) 5 MG tablet, TAKE 1 TABLET (5 MG TOTAL) BY MOUTH DAILY., Disp: 90  tablet, Rfl: 0   traZODone (DESYREL) 100 MG tablet, TAKE 1 TABLET BY MOUTH EVERYDAY AT BEDTIME, Disp: 90 tablet, Rfl: 3   triamcinolone cream (KENALOG) 0.1 %, APPLY TO AFFECTED AREA TWICE A DAY, Disp: 60 g, Rfl: 1  Allergies  Allergen Reactions   Cortisone Nausea And Vomiting and Swelling   Elemental Sulfur Nausea And Vomiting and Swelling    Objective:   BP 124/78   Pulse 78   Temp 98.1 F (36.7 C) (Temporal)   Ht 5\' 6"  (1.676 m)   Wt 273 lb 9.6 oz (124.1 kg)   SpO2 95%   BMI 44.16 kg/m    Physical Exam Vitals reviewed.   Constitutional:      General: She is not in acute distress.    Appearance: Normal appearance. She is not ill-appearing, toxic-appearing or diaphoretic.  HENT:     Head: Normocephalic and atraumatic.  Eyes:     General: No scleral icterus.       Right eye: No discharge.        Left eye: No discharge.     Conjunctiva/sclera: Conjunctivae normal.  Cardiovascular:     Rate and Rhythm: Normal rate.  Pulmonary:     Effort: Pulmonary effort is normal. No respiratory distress.  Musculoskeletal:        General: Normal range of motion.     Cervical back: Normal range of motion.     Right knee: Tenderness present.     Left knee: Tenderness present.  Skin:    General: Skin is warm and dry.     Capillary Refill: Capillary refill takes less than 2 seconds.     Findings: Rash present. Rash is scaling (erythematous to both arms, legs, and abdomen).  Neurological:     General: No focal deficit present.     Mental Status: She is alert and oriented to person, place, and time. Mental status is at baseline.  Psychiatric:        Mood and Affect: Mood normal.        Behavior: Behavior normal.        Thought Content: Thought content normal.        Judgment: Judgment normal.

## 2022-02-27 ENCOUNTER — Encounter: Payer: Self-pay | Admitting: Family Medicine

## 2022-02-27 DIAGNOSIS — M1711 Unilateral primary osteoarthritis, right knee: Secondary | ICD-10-CM

## 2022-03-01 ENCOUNTER — Encounter: Payer: Self-pay | Admitting: Family Medicine

## 2022-03-03 DIAGNOSIS — M1711 Unilateral primary osteoarthritis, right knee: Secondary | ICD-10-CM | POA: Diagnosis not present

## 2022-03-06 ENCOUNTER — Ambulatory Visit: Payer: 59 | Admitting: Orthopedic Surgery

## 2022-03-13 ENCOUNTER — Encounter: Payer: Self-pay | Admitting: Family Medicine

## 2022-03-17 ENCOUNTER — Other Ambulatory Visit: Payer: Self-pay | Admitting: Family Medicine

## 2022-03-17 DIAGNOSIS — J069 Acute upper respiratory infection, unspecified: Secondary | ICD-10-CM

## 2022-03-21 DIAGNOSIS — M1711 Unilateral primary osteoarthritis, right knee: Secondary | ICD-10-CM | POA: Insufficient documentation

## 2022-03-25 ENCOUNTER — Other Ambulatory Visit: Payer: Self-pay | Admitting: Family Medicine

## 2022-03-25 DIAGNOSIS — I10 Essential (primary) hypertension: Secondary | ICD-10-CM

## 2022-04-18 ENCOUNTER — Ambulatory Visit (INDEPENDENT_AMBULATORY_CARE_PROVIDER_SITE_OTHER): Payer: 59 | Admitting: Family Medicine

## 2022-04-18 ENCOUNTER — Encounter: Payer: Self-pay | Admitting: Family Medicine

## 2022-04-18 VITALS — BP 118/77 | HR 93 | Temp 98.0°F | Resp 20 | Ht 66.0 in | Wt 274.0 lb

## 2022-04-18 DIAGNOSIS — Z0001 Encounter for general adult medical examination with abnormal findings: Secondary | ICD-10-CM | POA: Diagnosis not present

## 2022-04-18 DIAGNOSIS — J4520 Mild intermittent asthma, uncomplicated: Secondary | ICD-10-CM | POA: Diagnosis not present

## 2022-04-18 DIAGNOSIS — E039 Hypothyroidism, unspecified: Secondary | ICD-10-CM

## 2022-04-18 DIAGNOSIS — J302 Other seasonal allergic rhinitis: Secondary | ICD-10-CM | POA: Diagnosis not present

## 2022-04-18 DIAGNOSIS — F319 Bipolar disorder, unspecified: Secondary | ICD-10-CM | POA: Diagnosis not present

## 2022-04-18 DIAGNOSIS — R059 Cough, unspecified: Secondary | ICD-10-CM

## 2022-04-18 DIAGNOSIS — I10 Essential (primary) hypertension: Secondary | ICD-10-CM | POA: Diagnosis not present

## 2022-04-18 DIAGNOSIS — R69 Illness, unspecified: Secondary | ICD-10-CM | POA: Diagnosis not present

## 2022-04-18 DIAGNOSIS — Z8673 Personal history of transient ischemic attack (TIA), and cerebral infarction without residual deficits: Secondary | ICD-10-CM

## 2022-04-18 DIAGNOSIS — F325 Major depressive disorder, single episode, in full remission: Secondary | ICD-10-CM | POA: Diagnosis not present

## 2022-04-18 DIAGNOSIS — Z0000 Encounter for general adult medical examination without abnormal findings: Secondary | ICD-10-CM

## 2022-04-18 MED ORDER — HYDROXYZINE HCL 25 MG PO TABS: 25.0000 mg | ORAL_TABLET | Freq: Two times a day (BID) | ORAL | 3 refills | Status: DC

## 2022-04-18 MED ORDER — BUPROPION HCL ER (XL) 300 MG PO TB24: 300.0000 mg | ORAL_TABLET | Freq: Every day | ORAL | 3 refills | Status: DC

## 2022-04-18 MED ORDER — LISINOPRIL 20 MG PO TABS: 20.0000 mg | ORAL_TABLET | Freq: Every day | ORAL | 3 refills | Status: DC

## 2022-04-18 MED ORDER — ROSUVASTATIN CALCIUM 5 MG PO TABS: 5.0000 mg | ORAL_TABLET | Freq: Every day | ORAL | 3 refills | Status: DC

## 2022-04-18 MED ORDER — MONTELUKAST SODIUM 10 MG PO TABS: 10.0000 mg | ORAL_TABLET | Freq: Every day | ORAL | 3 refills | Status: DC

## 2022-04-18 MED ORDER — CETIRIZINE HCL 10 MG PO TABS: 10.0000 mg | ORAL_TABLET | Freq: Every day | ORAL | 3 refills | Status: DC

## 2022-04-18 NOTE — Progress Notes (Signed)
Assessment & Plan:  Well adult exam Discussed health benefits of physical activity, and encouraged her to engage in regular exercise appropriate for her age and condition. Preventive health education provided. Patient declined Hep C/HIV screening.  Immunization History  Administered Date(s) Administered   Fluad Quad(high Dose 65+) 05/25/1999   Influenza Inj Mdck Quad Pf 05/25/1999   Influenza,inj,Quad PF,6+ Mos 05/24/2020, 05/09/2021   Influenza-Unspecified 04/15/2022   Moderna Sars-Covid-2 Vaccination 10/03/2019, 10/15/2019, 06/21/2020, 09/21/2020, 10/23/2020   PFIZER(Purple Top)SARS-COV-2 Vaccination 01/23/2022   Tdap 03/31/2021   Zoster Recombinat (Shingrix) 01/23/2022   Health Maintenance  Topic Date Due   Zoster Vaccines- Shingrix (2 of 2) 05/17/2022 (Originally 03/20/2022)   Hepatitis C Screening  04/18/2023 (Originally 02/05/1983)   HIV Screening  04/18/2023 (Originally 02/05/1980)   MAMMOGRAM  10/27/2022   COLONOSCOPY (Pts 45-71yr Insurance coverage will need to be confirmed)  03/01/2030   TETANUS/TDAP  04/01/2031   INFLUENZA VACCINE  Completed   COVID-19 Vaccine  Completed   HPV VACCINES  Aged Out   - CBC with Differential/Platelet - CMP14+EGFR - Lipid panel  2. Bipolar 1 disorder ([place] Well controlled on current regimen.  - hydrOXYzine (ATARAX) 25 MG tablet; Take 1 tablet (25 mg total) by mouth in the morning and at bedtime.  Dispense: 180 tablet; Refill: 3 - CBC with Differential/Platelet - CMP14+EGFR  3. Major depressive disorder with single episode, in full remission ([place] Well controlled on current regimen.  - buPROPion (WELLBUTRIN XL) 300 MG 24 hr tablet; Take 1 tablet (300 mg total) by mouth daily.  Dispense: 90 tablet; Refill: 3 - CMP14+EGFR  4. Essential hypertension Well controlled on current regimen.  - lisinopril (ZESTRIL) 20 MG tablet; Take 1 tablet (20 mg total) by mouth daily.  Dispense: 90 tablet; Refill: 3 - CBC with Differential/Platelet -  CMP14+EGFR - Lipid panel  5. History of stroke - rosuvastatin (CRESTOR) 5 MG tablet; Take 1 tablet (5 mg total) by mouth daily.  Dispense: 90 tablet; Refill: 3  6. Mild intermittent asthma without complication Well controlled on current regimen.  - montelukast (SINGULAIR) 10 MG tablet; Take 1 tablet (10 mg total) by mouth at bedtime.  Dispense: 90 tablet; Refill: 3  7. Acquired hypothyroidism Labs to reassess after dose adjustment. - TSH - T4, free  8. Seasonal allergies Well controlled on current regimen.  - cetirizine (ZYRTEC) 10 MG tablet; Take 1 tablet (10 mg total) by mouth daily.  Dispense: 90 tablet; Refill: 3 - CMP14+EGFR   Follow-up: Return in about 6 months (around 10/17/2022) for follow-up of chronic medication conditions.   BHendricks Limes MSN, APRN, FNP-C [facility]  Subjective:  Patient ID: Belinda Turner female    DOB: 702/04/1966 Age: 56y.o. MRN: [id number] Patient Care Team: JLoman Brooklyn FNP as PCP - General (Family Medicine)   CC:  Chief Complaint  Patient presents with   Annual Exam    HPI KMalaiah Viramontesis a 56y.o. female who presents today for a complete physical exam. She reports consuming a general diet. Home exercise routine includes walking. She generally feels well. She reports sleeping well. She does not have additional problems to discuss today.   Vision:Not within last year Dental:Receives regular dental care  Osteoarthritis of right knee: patient reports she has been given three gel injections recently and has a follow-up scheduled. She does report very mild relief.  Hypothyroidism: taking levothyroxine 125 mcg daily. Last dose adjustment in June 2023.  Hypertension: patient reports  good readings at home.   Asthma: using Albuterol as needed and taking Singulair nightly.   Bipolar/Depression: taking Wellbutrin, Trazodone, and Hydroxyzine. Previously weaned off Vaylar due to concerns of drug-induced  Parkinsonism (tremor did not resolve).      04/18/2022    1:39 PM 02/14/2022    3:09 PM 01/17/2022    3:28 PM  Depression screen PHQ 2/9  Decreased Interest 0 0 0  Down, Depressed, Hopeless 0 0 0  PHQ - 2 Score 0 0 0  Altered sleeping 1 0   Tired, decreased energy 0 0   Change in appetite 0 0   Feeling bad or failure about yourself  0 0   Trouble concentrating 0 0   Moving slowly or fidgety/restless 0 0   Suicidal thoughts 0 0   PHQ-9 Score 1 0   Difficult doing work/chores Not difficult at all Not difficult at all       04/18/2022    1:40 PM 02/14/2022    3:10 PM 12/29/2021    3:31 PM 09/01/2021    8:08 AM  GAD 7 : Generalized Anxiety Score  Nervous, Anxious, on Edge 0 0 0 0  Control/stop worrying 0 0 0 0  Worry too much - different things 0 0 0 0  Trouble relaxing 0 0 0 0  Restless 0 0 0 0  Easily annoyed or irritable 0 0 0 0  Afraid - awful might happen 0 0 0 0  Total GAD 7 Score 0 0 0 0  Anxiety Difficulty Not difficult at all Not difficult at all Not difficult at all Not difficult at all    Review of Systems  Constitutional:  Negative for chills, fever, malaise/fatigue and weight loss.  HENT:  Negative for congestion, ear discharge, ear pain, nosebleeds, sinus pain, sore throat and tinnitus.   Eyes:  Negative for blurred vision, double vision, pain, discharge and redness.  Respiratory:  Negative for cough, shortness of breath and wheezing.   Cardiovascular:  Negative for chest pain, palpitations and leg swelling.  Gastrointestinal:  Negative for abdominal pain, constipation, diarrhea, heartburn, nausea and vomiting.  Genitourinary:  Negative for dysuria, frequency and urgency.  Musculoskeletal:  Negative for myalgias.  Skin:  Negative for rash.  Neurological:  Negative for dizziness, seizures, weakness and headaches.  Psychiatric/Behavioral:  Negative for depression, substance abuse and suicidal ideas. The patient is not nervous/anxious.      Current Outpatient  Medications:    albuterol (VENTOLIN HFA) 108 (90 Base) MCG/ACT inhaler, TAKE 2 PUFFS BY MOUTH EVERY 6 HOURS AS NEEDED FOR WHEEZE OR SHORTNESS OF BREATH, Disp: 6.7 each, Rfl: 2   betamethasone dipropionate 0.05 % cream, Apply topically 2 (two) times daily., Disp: 45 g, Rfl: 2   buPROPion (WELLBUTRIN XL) 300 MG 24 hr tablet, TAKE 1 TABLET BY MOUTH EVERY DAY, Disp: 90 tablet, Rfl: 0   cetirizine (ZYRTEC) 10 MG tablet, TAKE 1 TABLET BY MOUTH EVERY DAY, Disp: 90 tablet, Rfl: 1   Chromium-Cinnamon (CINNAMON PLUS CHROMIUM) [phone] MCG-MG CAPS, Take 1,000 mcg by mouth daily., Disp: , Rfl:    diclofenac (VOLTAREN) 75 MG EC tablet, Take 1 tablet (75 mg total) by mouth 2 (two) times daily., Disp: 60 tablet, Rfl: 2   fluticasone (FLONASE) 50 MCG/ACT nasal spray, Place 2 sprays into both nostrils daily., Disp: 16 g, Rfl: 6   hydrOXYzine (ATARAX) 25 MG tablet, TAKE 1 TABLET (25 MG TOTAL) BY MOUTH IN THE MORNING AND AT BEDTIME., Disp: 180 tablet, Rfl:  0   levothyroxine (SYNTHROID) 125 MCG tablet, Take 1 tablet (125 mcg total) by mouth daily., Disp: 90 tablet, Rfl: 3   lisinopril (ZESTRIL) 20 MG tablet, Take 1 tablet (20 mg total) by mouth daily., Disp: 90 tablet, Rfl: 1   magnesium oxide (MAG-OX) 400 MG tablet, Take 400 mg by mouth daily., Disp: , Rfl:    montelukast (SINGULAIR) 10 MG tablet, Take 1 tablet (10 mg total) by mouth at bedtime., Disp: 30 tablet, Rfl: 3   Multiple Vitamin (MULTIVITAMIN) tablet, Take 1 tablet by mouth daily., Disp: , Rfl:    rosuvastatin (CRESTOR) 5 MG tablet, TAKE 1 TABLET (5 MG TOTAL) BY MOUTH DAILY., Disp: 90 tablet, Rfl: 0   traZODone (DESYREL) 100 MG tablet, TAKE 1 TABLET BY MOUTH EVERYDAY AT BEDTIME, Disp: 90 tablet, Rfl: 3  Allergies  Allergen Reactions   Cortisone Nausea And Vomiting and Swelling   Elemental Sulfur Nausea And Vomiting and Swelling    Past Medical History:  Diagnosis Date   Asthma    Bipolar 1 disorder ([place])    Depression    Hypertension    Migraine     Stroke ([place]) 2016   2 strokes   Thyroid disease     Past Surgical History:  Procedure Laterality Date   CESAREAN SECTION  2005   CESAREAN SECTION  2006   CHOLECYSTECTOMY  2014   COLONOSCOPY WITH PROPOFOL N/A 03/01/2020   Procedure: COLONOSCOPY WITH PROPOFOL;  Surgeon: Daneil Dolin, MD;  [facility]: [facility];  Service: Endoscopy;  Laterality: N/A;  2:00pm   THYROIDECTOMY  2013   TOTAL ABDOMINAL HYSTERECTOMY  2010    Family History  Problem Relation Age of Onset   Depression Mother    Thyroid disease Mother    Diabetes Father    Thyroid disease Sister    ADD / ADHD Son    Autism Son    Bipolar disorder Son    Thyroid disease Maternal Grandmother    Breast cancer Maternal Grandmother    Diabetes Paternal Grandmother    Diabetes Paternal Grandfather    Colon cancer Neg Hx     Social History   Socioeconomic History   Marital status: Married    Spouse name: Not on file   Number of children: Not on file   Years of education: Not on file   Highest education level: Not on file  Occupational History   Not on file  Tobacco Use   Smoking status: Former    Types: Cigarettes    Quit date: 1984    Years since quitting: 39.7   Smokeless tobacco: Never  Vaping Use   Vaping Use: Never used  Substance and Sexual Activity   Alcohol use: Yes    Comment: occ- 1-2 times a month.    Drug use: Never   Sexual activity: Yes    Birth control/protection: None  Other Topics Concern   Not on file  Social History Narrative   Not on file   Social Determinants of Health   Financial Resource Strain: Not on file  Food Insecurity: Not on file  Transportation Needs: Not on file  Physical Activity: Not on file  Stress: Not on file  Social Connections: Not on file  Intimate Partner Violence: Not on file      Objective:    BP 118/77   Pulse 93   Temp 98 F (36.7 C)   Resp 20   Ht '5\' 6"'  (1.676 m)   Wt 274 lb (  124.3 kg)   SpO2 96%   BMI 44.22 kg/m   BP Readings  from Last 3 Encounters:  04/18/22 118/77  02/14/22 124/78  01/17/22 129/79   Wt Readings from Last 3 Encounters:  04/18/22 274 lb (124.3 kg)  02/14/22 273 lb 9.6 oz (124.1 kg)  01/17/22 271 lb (122.9 kg)    Physical Exam Vitals reviewed.  Constitutional:      General: She is not in acute distress.    Appearance: Normal appearance. She is morbidly obese. She is not ill-appearing, toxic-appearing or diaphoretic.  HENT:     Head: Normocephalic and atraumatic.     Right Ear: Tympanic membrane, ear canal and external ear normal. There is no impacted cerumen.     Left Ear: Tympanic membrane, ear canal and external ear normal. There is no impacted cerumen.     Nose: Nose normal. No congestion or rhinorrhea.     Mouth/Throat:     Mouth: Mucous membranes are moist.     Pharynx: Oropharynx is clear. No oropharyngeal exudate or posterior oropharyngeal erythema.  Eyes:     General: No scleral icterus.       Right eye: No discharge.        Left eye: No discharge.     Conjunctiva/sclera: Conjunctivae normal.     Pupils: Pupils are equal, round, and reactive to light.  Cardiovascular:     Rate and Rhythm: Normal rate and regular rhythm.     Heart sounds: Normal heart sounds. No murmur heard.    No friction rub. No gallop.  Pulmonary:     Effort: Pulmonary effort is normal. No respiratory distress.     Breath sounds: Normal breath sounds. No stridor. No wheezing, rhonchi or rales.  Abdominal:     General: Abdomen is flat. Bowel sounds are normal. There is no distension.     Palpations: Abdomen is soft. There is no hepatomegaly, splenomegaly or mass.     Tenderness: There is no abdominal tenderness. There is no guarding or rebound.     Hernia: No hernia is present.  Musculoskeletal:        General: Normal range of motion.     Cervical back: Normal range of motion and neck supple. No rigidity. No muscular tenderness.  Lymphadenopathy:     Cervical: No cervical adenopathy.  Skin:     General: Skin is warm and dry.     Capillary Refill: Capillary refill takes less than 2 seconds.  Neurological:     General: No focal deficit present.     Mental Status: She is alert and oriented to person, place, and time. Mental status is at baseline.  Psychiatric:        Mood and Affect: Mood normal.        Behavior: Behavior normal.        Thought Content: Thought content normal.        Judgment: Judgment normal.     Lab Results  Component Value Date   TSH 0.028 (L) 01/17/2022   Lab Results  Component Value Date   WBC 8.1 01/17/2022   HGB 13.7 01/17/2022   HCT 41.9 01/17/2022   MCV 86 01/17/2022   PLT 230 01/17/2022   Lab Results  Component Value Date   NA 140 03/31/2021   K 4.5 03/31/2021   CO2 23 03/31/2021   GLUCOSE 81 03/31/2021   BUN 11 03/31/2021   CREATININE 0.98 03/31/2021   BILITOT 0.8 03/31/2021   ALKPHOS 102 03/31/2021  AST 22 03/31/2021   ALT 19 03/31/2021   PROT 6.6 03/31/2021   ALBUMIN 4.5 03/31/2021   CALCIUM 9.9 03/31/2021   EGFR 68 03/31/2021   Lab Results  Component Value Date   CHOL 137 03/31/2021   Lab Results  Component Value Date   HDL 40 03/31/2021   Lab Results  Component Value Date   LDLCALC 79 03/31/2021   Lab Results  Component Value Date   TRIG 93 03/31/2021   Lab Results  Component Value Date   CHOLHDL 3.4 03/31/2021   No results found for: "HGBA1C"

## 2022-04-19 LAB — CMP14+EGFR
ALT: 40 IU/L — ABNORMAL HIGH (ref 0–32)
AST: 36 IU/L (ref 0–40)
Albumin/Globulin Ratio: 1.9 (ref 1.2–2.2)
Albumin: 4.4 g/dL (ref 3.8–4.9)
Alkaline Phosphatase: 91 IU/L (ref 44–121)
BUN/Creatinine Ratio: 13 (ref 9–23)
BUN: 13 mg/dL (ref 6–24)
Bilirubin Total: 0.3 mg/dL (ref 0.0–1.2)
CO2: 24 mmol/L (ref 20–29)
Calcium: 9.7 mg/dL (ref 8.7–10.2)
Chloride: 102 mmol/L (ref 96–106)
Creatinine, Ser: 1 mg/dL (ref 0.57–1.00)
Globulin, Total: 2.3 g/dL (ref 1.5–4.5)
Glucose: 86 mg/dL (ref 70–99)
Potassium: 4.6 mmol/L (ref 3.5–5.2)
Sodium: 140 mmol/L (ref 134–144)
Total Protein: 6.7 g/dL (ref 6.0–8.5)
eGFR: 66 mL/min/{1.73_m2} (ref >59)

## 2022-04-19 LAB — CBC WITH DIFFERENTIAL/PLATELET
Basophils Absolute: 0 10*3/uL (ref 0.0–0.2)
Basos: 1 %
EOS (ABSOLUTE): 0.2 10*3/uL (ref 0.0–0.4)
Eos: 3 %
Hematocrit: 42.7 % (ref 34.0–46.6)
Hemoglobin: 13.9 g/dL (ref 11.1–15.9)
Immature Grans (Abs): 0 10*3/uL (ref 0.0–0.1)
Immature Granulocytes: 0 %
Lymphocytes Absolute: 2.1 10*3/uL (ref 0.7–3.1)
Lymphs: 33 %
MCH: 27.6 pg (ref 26.6–33.0)
MCHC: 32.6 g/dL (ref 31.5–35.7)
MCV: 85 fL (ref 79–97)
Monocytes Absolute: 0.9 10*3/uL (ref 0.1–0.9)
Monocytes: 14 %
Neutrophils Absolute: 3.1 10*3/uL (ref 1.4–7.0)
Neutrophils: 49 %
Platelets: 233 10*3/uL (ref 150–450)
RBC: 5.03 x10E6/uL (ref 3.77–5.28)
RDW: 14.4 % (ref 11.7–15.4)
WBC: 6.3 10*3/uL (ref 3.4–10.8)

## 2022-04-19 LAB — LIPID PANEL
Chol/HDL Ratio: 2.6 ratio (ref 0.0–4.4)
Cholesterol, Total: 122 mg/dL (ref 100–199)
HDL: 47 mg/dL (ref >39)
LDL Chol Calc (NIH): 54 mg/dL (ref 0–99)
Triglycerides: 117 mg/dL (ref 0–149)
VLDL Cholesterol Cal: 21 mg/dL (ref 5–40)

## 2022-04-19 LAB — T4, FREE: Free T4: 1.78 ng/dL — ABNORMAL HIGH (ref 0.82–1.77)

## 2022-04-19 LAB — TSH: TSH: 0.989 u[IU]/mL (ref 0.450–4.500)

## 2022-05-06 ENCOUNTER — Other Ambulatory Visit: Payer: Self-pay | Admitting: Family Medicine

## 2022-05-06 DIAGNOSIS — M1711 Unilateral primary osteoarthritis, right knee: Secondary | ICD-10-CM

## 2022-05-08 ENCOUNTER — Other Ambulatory Visit: Payer: Self-pay | Admitting: Family Medicine

## 2022-05-08 DIAGNOSIS — E039 Hypothyroidism, unspecified: Secondary | ICD-10-CM

## 2022-05-11 DIAGNOSIS — E669 Obesity, unspecified: Secondary | ICD-10-CM | POA: Diagnosis not present

## 2022-05-11 DIAGNOSIS — M1711 Unilateral primary osteoarthritis, right knee: Secondary | ICD-10-CM | POA: Diagnosis not present

## 2022-05-29 ENCOUNTER — Other Ambulatory Visit: Payer: Self-pay | Admitting: Family Medicine

## 2022-05-29 DIAGNOSIS — J069 Acute upper respiratory infection, unspecified: Secondary | ICD-10-CM

## 2022-06-02 ENCOUNTER — Encounter: Payer: Self-pay | Admitting: Family Medicine

## 2022-06-20 ENCOUNTER — Other Ambulatory Visit: Payer: Self-pay | Admitting: Family Medicine

## 2022-06-20 DIAGNOSIS — J069 Acute upper respiratory infection, unspecified: Secondary | ICD-10-CM

## 2022-06-22 DIAGNOSIS — E669 Obesity, unspecified: Secondary | ICD-10-CM | POA: Diagnosis not present

## 2022-06-23 ENCOUNTER — Telehealth: Payer: 59 | Admitting: Physician Assistant

## 2022-06-23 DIAGNOSIS — B9689 Other specified bacterial agents as the cause of diseases classified elsewhere: Secondary | ICD-10-CM | POA: Diagnosis not present

## 2022-06-23 DIAGNOSIS — J019 Acute sinusitis, unspecified: Secondary | ICD-10-CM

## 2022-06-23 MED ORDER — AMOXICILLIN-POT CLAVULANATE 875-125 MG PO TABS: 1.0000 | ORAL_TABLET | Freq: Two times a day (BID) | ORAL | 0 refills | Status: DC

## 2022-06-23 NOTE — Patient Instructions (Signed)
Belinda Turner, thank you for joining Margaretann Loveless, PA-C for today's virtual visit.  While this provider is not your primary care provider (PCP), if your PCP is located in our provider database this encounter information will be shared with them immediately following your visit.   A [facility] MyChart account gives you access to today's visit and all your visits, tests, and labs performed at [facility] " click here if you don't have a [facility] MyChart account or go to [email or web address]  Consent: (Patient) Belinda Turner provided verbal consent for this virtual visit at the beginning of the encounter.  Current Medications:  Current Outpatient Medications:    amoxicillin-clavulanate (AUGMENTIN) 875-125 MG tablet, Take 1 tablet by mouth 2 (two) times daily., Disp: 20 tablet, Rfl: 0   albuterol (VENTOLIN HFA) 108 (90 Base) MCG/ACT inhaler, TAKE 2 PUFFS BY MOUTH EVERY 6 HOURS AS NEEDED FOR WHEEZE OR SHORTNESS OF BREATH, Disp: 6.7 each, Rfl: 2   betamethasone dipropionate 0.05 % cream, Apply topically 2 (two) times daily., Disp: 45 g, Rfl: 2   buPROPion (WELLBUTRIN XL) 300 MG 24 hr tablet, Take 1 tablet (300 mg total) by mouth daily., Disp: 90 tablet, Rfl: 3   cetirizine (ZYRTEC) 10 MG tablet, Take 1 tablet (10 mg total) by mouth daily., Disp: 90 tablet, Rfl: 3   Chromium-Cinnamon (CINNAMON PLUS CHROMIUM) [phone] MCG-MG CAPS, Take 1,000 mcg by mouth daily., Disp: , Rfl:    diclofenac (VOLTAREN) 75 MG EC tablet, TAKE 1 TABLET BY MOUTH TWICE A DAY, Disp: 60 tablet, Rfl: 1   fluticasone (FLONASE) 50 MCG/ACT nasal spray, SPRAY 2 SPRAYS INTO EACH NOSTRIL EVERY DAY, Disp: 48 mL, Rfl: 1   hydrOXYzine (ATARAX) 25 MG tablet, Take 1 tablet (25 mg total) by mouth in the morning and at bedtime., Disp: 180 tablet, Rfl: 3   levothyroxine (SYNTHROID) 125 MCG tablet, Take 1 tablet (125 mcg total) by mouth daily., Disp: 90 tablet, Rfl: 3   lisinopril (ZESTRIL) 20 MG tablet,  Take 1 tablet (20 mg total) by mouth daily., Disp: 90 tablet, Rfl: 3   magnesium oxide (MAG-OX) 400 MG tablet, Take 400 mg by mouth daily., Disp: , Rfl:    montelukast (SINGULAIR) 10 MG tablet, Take 1 tablet (10 mg total) by mouth at bedtime., Disp: 90 tablet, Rfl: 3   Multiple Vitamin (MULTIVITAMIN) tablet, Take 1 tablet by mouth daily., Disp: , Rfl:    rosuvastatin (CRESTOR) 5 MG tablet, Take 1 tablet (5 mg total) by mouth daily., Disp: 90 tablet, Rfl: 3   traZODone (DESYREL) 100 MG tablet, TAKE 1 TABLET BY MOUTH EVERYDAY AT BEDTIME, Disp: 90 tablet, Rfl: 3   Medications ordered in this encounter:  Meds ordered this encounter  Medications   amoxicillin-clavulanate (AUGMENTIN) 875-125 MG tablet    Sig: Take 1 tablet by mouth 2 (two) times daily.    Dispense:  20 tablet    Refill:  0    Order Specific Question:   Supervising Provider    Answer:   Merrilee Jansky [id number]     *If you need refills on other medications prior to your next appointment, please contact your pharmacy*  Follow-Up: Call back or seek an in-person evaluation if the symptoms worsen or if the condition fails to improve as anticipated.  [facility] [phone]  Other Instructions  Sinus Infection, Adult A sinus infection, also called sinusitis, is inflammation of your sinuses. Sinuses are hollow spaces in the bones around your face.  Your sinuses are located: Around your eyes. In the middle of your forehead. Behind your nose. In your cheekbones. Mucus normally drains out of your sinuses. When your nasal tissues become inflamed or swollen, mucus can become trapped or blocked. This allows bacteria, viruses, and fungi to grow, which leads to infection. Most infections of the sinuses are caused by a virus. A sinus infection can develop quickly. It can last for up to 4 weeks (acute) or for more than 12 weeks (chronic). A sinus infection often develops after a cold. What are the causes? This  condition is caused by anything that creates swelling in the sinuses or stops mucus from draining. This includes: Allergies. Asthma. Infection from bacteria or viruses. Deformities or blockages in your nose or sinuses. Abnormal growths in the nose (nasal polyps). Pollutants, such as chemicals or irritants in the air. Infection from fungi. This is rare. What increases the risk? You are more likely to develop this condition if you: Have a weak body defense system (immune system). Do a lot of swimming or diving. Overuse nasal sprays. Smoke. What are the signs or symptoms? The main symptoms of this condition are pain and a feeling of pressure around the affected sinuses. Other symptoms include: Stuffy nose or congestion that makes it difficult to breathe through your nose. Thick yellow or greenish drainage from your nose. Tenderness, swelling, and warmth over the affected sinuses. A cough that may get worse at night. Decreased sense of smell and taste. Extra mucus that collects in the throat or the back of the nose (postnasal drip) causing a sore throat or bad breath. Tiredness (fatigue). Fever. How is this diagnosed? This condition is diagnosed based on: Your symptoms. Your medical history. A physical exam. Tests to find out if your condition is acute or chronic. This may include: Checking your nose for nasal polyps. Viewing your sinuses using a device that has a light (endoscope). Testing for allergies or bacteria. Imaging tests, such as an MRI or CT scan. In rare cases, a bone biopsy may be done to rule out more serious types of fungal sinus disease. How is this treated? Treatment for a sinus infection depends on the cause and whether your condition is chronic or acute. If caused by a virus, your symptoms should go away on their own within 10 days. You may be given medicines to relieve symptoms. They include: Medicines that shrink swollen nasal passages (decongestants). A spray  that eases inflammation of the nostrils (topical intranasal corticosteroids). Rinses that help get rid of thick mucus in your nose (nasal saline washes). Medicines that treat allergies (antihistamines). Over-the-counter pain relievers. If caused by bacteria, your health care provider may recommend waiting to see if your symptoms improve. Most bacterial infections will get better without antibiotic medicine. You may be given antibiotics if you have: A severe infection. A weak immune system. If caused by narrow nasal passages or nasal polyps, surgery may be needed. Follow these instructions at home: Medicines Take, use, or apply over-the-counter and prescription medicines only as told by your health care provider. These may include nasal sprays. If you were prescribed an antibiotic medicine, take it as told by your health care provider. Do not stop taking the antibiotic even if you start to feel better. Hydrate and humidify  Drink enough fluid to keep your urine pale yellow. Staying hydrated will help to thin your mucus. Use a cool mist humidifier to keep the humidity level in your home above 50%. Inhale steam for 10-15  minutes, 3-4 times a day, or as told by your health care provider. You can do this in the bathroom while a hot shower is running. Limit your exposure to cool or dry air. Rest Rest as much as possible. Sleep with your head raised (elevated). Make sure you get enough sleep each night. General instructions  Apply a warm, moist washcloth to your face 3-4 times a day or as told by your health care provider. This will help with discomfort. Use nasal saline washes as often as told by your health care provider. Wash your hands often with soap and water to reduce your exposure to germs. If soap and water are not available, use hand sanitizer. Do not smoke. Avoid being around people who are smoking (secondhand smoke). Keep all follow-up visits. This is important. Contact a health  care provider if: You have a fever. Your symptoms get worse. Your symptoms do not improve within 10 days. Get help right away if: You have a severe headache. You have persistent vomiting. You have severe pain or swelling around your face or eyes. You have vision problems. You develop confusion. Your neck is stiff. You have trouble breathing. These symptoms may be an emergency. Get help right away. Call 911. Do not wait to see if the symptoms will go away. Do not drive yourself to the hospital. Summary A sinus infection is soreness and inflammation of your sinuses. Sinuses are hollow spaces in the bones around your face. This condition is caused by nasal tissues that become inflamed or swollen. The swelling traps or blocks the flow of mucus. This allows bacteria, viruses, and fungi to grow, which leads to infection. If you were prescribed an antibiotic medicine, take it as told by your health care provider. Do not stop taking the antibiotic even if you start to feel better. Keep all follow-up visits. This is important. This information is not intended to replace advice given to you by your health care provider. Make sure you discuss any questions you have with your health care provider. Document Revised: 06/14/2021 Document Reviewed: 06/14/2021 Elsevier Patient Education  2023 [employer].    If you have been instructed to have an in-person evaluation today at a local Urgent Care facility, please use the link below. It will take you to a list of all of our available [facility], including address, phone number and hours of operation. Please do not delay care.  [facility]  If you or a family member do not have a primary care provider, use the link below to schedule a visit and establish care. When you choose a [facility] primary care physician or advanced practice provider, you gain a long-term partner in health. Find a Primary Care Provider  Learn more  about [facility]'s in-office and virtual care options: [facility] - Get Care Now

## 2022-06-23 NOTE — Progress Notes (Signed)
Virtual Visit Consent   Belinda Turner, you are scheduled for a virtual visit with a [facility] provider today. Just as with appointments in the office, your consent must be obtained to participate. Your consent will be active for this visit and any virtual visit you may have with one of our providers in the next 365 days. If you have a MyChart account, a copy of this consent can be sent to you electronically.  As this is a virtual visit, video technology does not allow for your provider to perform a traditional examination. This may limit your provider's ability to fully assess your condition. If your provider identifies any concerns that need to be evaluated in person or the need to arrange testing (such as labs, EKG, etc.), we will make arrangements to do so. Although advances in technology are sophisticated, we cannot ensure that it will always work on either your end or our end. If the connection with a video visit is poor, the visit may have to be switched to a telephone visit. With either a video or telephone visit, we are not always able to ensure that we have a secure connection.  By engaging in this virtual visit, you consent to the provision of healthcare and authorize for your insurance to be billed (if applicable) for the services provided during this visit. Depending on your insurance coverage, you may receive a charge related to this service.  I need to obtain your verbal consent now. Are you willing to proceed with your visit today? [name] has provided verbal consent on 06/23/2022 for a virtual visit (video or telephone). Margaretann Loveless, PA-C  Date: 06/23/2022 10:39 AM  Virtual Visit via Video Note   I, Margaretann Loveless, connected with  Belinda Turner  ([id number], [birth year]) on 06/23/22 at 10:30 AM EST by a video-enabled telemedicine application and verified that I am speaking with the correct person using two identifiers.  Location: Patient: Virtual  Visit Location Patient: Home Provider: Virtual Visit Location Provider: Home Office   I discussed the limitations of evaluation and management by telemedicine and the availability of in person appointments. The patient expressed understanding and agreed to proceed.    History of Present Illness: [name] is a 56 y.o. who identifies as a female who was assigned female at birth, and is being seen today for possible sinus infection.  HPI: Sinusitis This is a new problem. The current episode started 1 to 4 weeks ago (3 weeks). The problem has been gradually worsening since onset. There has been no fever. Associated symptoms include congestion, coughing, ear pain, headaches and sinus pressure. Pertinent negatives include no chills, hoarse voice, sore throat or swollen glands. Manson Passey and red purulent discharge from nose with blowing) Treatments tried: mucinex dm, sinus medications, tylenol and ibuprofen. The treatment provided no relief.     Problems:  Patient Active Problem List   Diagnosis Date Noted   Osteoarthritis of right knee 03/21/2022   History of stroke 03/31/2021   Other fatigue 05/10/2020   Paresthesia 05/10/2020   Essential hypertension 03/24/2020   Bipolar 1 disorder (HCC)    Depression    Asthma    Acquired hypothyroidism 06/26/2014    Allergies:  Allergies  Allergen Reactions   Cortisone Nausea And Vomiting and Swelling   Elemental Sulfur Nausea And Vomiting and Swelling   Medications:  Current Outpatient Medications:    amoxicillin-clavulanate (AUGMENTIN) 875-125 MG tablet, Take 1 tablet by mouth 2 (two) times daily.,  Disp: 20 tablet, Rfl: 0   albuterol (VENTOLIN HFA) 108 (90 Base) MCG/ACT inhaler, TAKE 2 PUFFS BY MOUTH EVERY 6 HOURS AS NEEDED FOR WHEEZE OR SHORTNESS OF BREATH, Disp: 6.7 each, Rfl: 2   betamethasone dipropionate 0.05 % cream, Apply topically 2 (two) times daily., Disp: 45 g, Rfl: 2   buPROPion (WELLBUTRIN XL) 300 MG 24 hr tablet, Take 1  tablet (300 mg total) by mouth daily., Disp: 90 tablet, Rfl: 3   cetirizine (ZYRTEC) 10 MG tablet, Take 1 tablet (10 mg total) by mouth daily., Disp: 90 tablet, Rfl: 3   Chromium-Cinnamon (CINNAMON PLUS CHROMIUM) [phone] MCG-MG CAPS, Take 1,000 mcg by mouth daily., Disp: , Rfl:    diclofenac (VOLTAREN) 75 MG EC tablet, TAKE 1 TABLET BY MOUTH TWICE A DAY, Disp: 60 tablet, Rfl: 1   fluticasone (FLONASE) 50 MCG/ACT nasal spray, SPRAY 2 SPRAYS INTO EACH NOSTRIL EVERY DAY, Disp: 48 mL, Rfl: 1   hydrOXYzine (ATARAX) 25 MG tablet, Take 1 tablet (25 mg total) by mouth in the morning and at bedtime., Disp: 180 tablet, Rfl: 3   levothyroxine (SYNTHROID) 125 MCG tablet, Take 1 tablet (125 mcg total) by mouth daily., Disp: 90 tablet, Rfl: 3   lisinopril (ZESTRIL) 20 MG tablet, Take 1 tablet (20 mg total) by mouth daily., Disp: 90 tablet, Rfl: 3   magnesium oxide (MAG-OX) 400 MG tablet, Take 400 mg by mouth daily., Disp: , Rfl:    montelukast (SINGULAIR) 10 MG tablet, Take 1 tablet (10 mg total) by mouth at bedtime., Disp: 90 tablet, Rfl: 3   Multiple Vitamin (MULTIVITAMIN) tablet, Take 1 tablet by mouth daily., Disp: , Rfl:    rosuvastatin (CRESTOR) 5 MG tablet, Take 1 tablet (5 mg total) by mouth daily., Disp: 90 tablet, Rfl: 3   traZODone (DESYREL) 100 MG tablet, TAKE 1 TABLET BY MOUTH EVERYDAY AT BEDTIME, Disp: 90 tablet, Rfl: 3  Observations/Objective: Patient is well-developed, well-nourished in no acute distress.  Resting comfortably at home.  Head is normocephalic, atraumatic.  No labored breathing.  Speech is clear and coherent with logical content.  Patient is alert and oriented at baseline.    Assessment and Plan: 1. Acute bacterial sinusitis - amoxicillin-clavulanate (AUGMENTIN) 875-125 MG tablet; Take 1 tablet by mouth 2 (two) times daily.  Dispense: 20 tablet; Refill: 0  - Worsening symptoms that have not responded to OTC medications.  - Will give augmentin - Continue allergy  medications.  - Steam and humidifier can help - Stay well hydrated and get plenty of rest.  - Seek in person evaluation if no symptom improvement or if symptoms worsen   Follow Up Instructions: I discussed the assessment and treatment plan with the patient. The patient was provided an opportunity to ask questions and all were answered. The patient agreed with the plan and demonstrated an understanding of the instructions.  A copy of instructions were sent to the patient via MyChart unless otherwise noted below.    The patient was advised to call back or seek an in-person evaluation if the symptoms worsen or if the condition fails to improve as anticipated.  Time:  I spent 8 minutes with the patient via telehealth technology discussing the above problems/concerns.    Margaretann Loveless, PA-C

## 2022-07-04 ENCOUNTER — Encounter: Payer: Self-pay | Admitting: Family Medicine

## 2022-07-04 ENCOUNTER — Encounter: Payer: Self-pay | Admitting: Physician Assistant

## 2022-07-19 DIAGNOSIS — R69 Illness, unspecified: Secondary | ICD-10-CM | POA: Diagnosis not present

## 2022-07-19 DIAGNOSIS — G4700 Insomnia, unspecified: Secondary | ICD-10-CM | POA: Diagnosis not present

## 2022-07-19 DIAGNOSIS — E039 Hypothyroidism, unspecified: Secondary | ICD-10-CM | POA: Diagnosis not present

## 2022-07-19 DIAGNOSIS — Z87891 Personal history of nicotine dependence: Secondary | ICD-10-CM | POA: Diagnosis not present

## 2022-07-19 DIAGNOSIS — Z8673 Personal history of transient ischemic attack (TIA), and cerebral infarction without residual deficits: Secondary | ICD-10-CM | POA: Diagnosis not present

## 2022-07-19 DIAGNOSIS — I10 Essential (primary) hypertension: Secondary | ICD-10-CM | POA: Diagnosis not present

## 2022-07-19 DIAGNOSIS — J45909 Unspecified asthma, uncomplicated: Secondary | ICD-10-CM | POA: Diagnosis not present

## 2022-07-19 DIAGNOSIS — R32 Unspecified urinary incontinence: Secondary | ICD-10-CM | POA: Diagnosis not present

## 2022-07-19 DIAGNOSIS — Z833 Family history of diabetes mellitus: Secondary | ICD-10-CM | POA: Diagnosis not present

## 2022-07-19 DIAGNOSIS — M1990 Unspecified osteoarthritis, unspecified site: Secondary | ICD-10-CM | POA: Diagnosis not present

## 2022-07-19 DIAGNOSIS — E785 Hyperlipidemia, unspecified: Secondary | ICD-10-CM | POA: Diagnosis not present

## 2022-08-24 ENCOUNTER — Other Ambulatory Visit: Payer: Self-pay | Admitting: Family Medicine

## 2022-08-24 DIAGNOSIS — J069 Acute upper respiratory infection, unspecified: Secondary | ICD-10-CM

## 2022-09-21 ENCOUNTER — Encounter: Payer: Self-pay | Admitting: Radiology

## 2022-10-05 ENCOUNTER — Encounter: Payer: Self-pay | Admitting: Family Medicine

## 2022-12-13 ENCOUNTER — Other Ambulatory Visit: Payer: Self-pay | Admitting: Family Medicine

## 2022-12-13 DIAGNOSIS — F325 Major depressive disorder, single episode, in full remission: Secondary | ICD-10-CM

## 2022-12-13 DIAGNOSIS — E890 Postprocedural hypothyroidism: Secondary | ICD-10-CM

## 2022-12-13 NOTE — Telephone Encounter (Signed)
Tiffany NTBS no refills given

## 2022-12-13 NOTE — Telephone Encounter (Signed)
Appt made for 12/19/22 with Belinda Turner

## 2022-12-14 ENCOUNTER — Telehealth: Payer: Self-pay | Admitting: Family Medicine

## 2022-12-14 DIAGNOSIS — F325 Major depressive disorder, single episode, in full remission: Secondary | ICD-10-CM

## 2022-12-14 NOTE — Telephone Encounter (Signed)
  Prescription Request  12/14/2022  Is this a "Controlled Substance" medicine? no  Have you seen your PCP in the last 2 weeks? No former [facility] pt has appt with [facility] on 12/27/22 will run out of meds before then   If YES, route message to pool  -  If NO, patient needs to be scheduled for appointment.  What is the name of the medication or equipment? traZODone (DESYREL) 100 MG tablet   Have you contacted your pharmacy to request a refill? Yes   Which pharmacy would you like this sent to? [facility]   Patient notified that their request is being sent to the clinical staff for review and that they should receive a response within 2 business days.

## 2022-12-19 ENCOUNTER — Ambulatory Visit: Payer: 59 | Admitting: Nurse Practitioner

## 2022-12-20 MED ORDER — TRAZODONE HCL 100 MG PO TABS: ORAL_TABLET | ORAL | 3 refills | Status: DC

## 2022-12-27 ENCOUNTER — Encounter: Payer: Self-pay | Admitting: Family Medicine

## 2022-12-27 ENCOUNTER — Ambulatory Visit (INDEPENDENT_AMBULATORY_CARE_PROVIDER_SITE_OTHER): Payer: PRIVATE HEALTH INSURANCE | Admitting: Family Medicine

## 2022-12-27 VITALS — BP 134/84 | HR 95 | Temp 97.5°F | Ht 66.0 in | Wt 259.0 lb

## 2022-12-27 DIAGNOSIS — Z8673 Personal history of transient ischemic attack (TIA), and cerebral infarction without residual deficits: Secondary | ICD-10-CM

## 2022-12-27 DIAGNOSIS — F319 Bipolar disorder, unspecified: Secondary | ICD-10-CM

## 2022-12-27 DIAGNOSIS — J4520 Mild intermittent asthma, uncomplicated: Secondary | ICD-10-CM

## 2022-12-27 DIAGNOSIS — R300 Dysuria: Secondary | ICD-10-CM

## 2022-12-27 DIAGNOSIS — F325 Major depressive disorder, single episode, in full remission: Secondary | ICD-10-CM

## 2022-12-27 DIAGNOSIS — E890 Postprocedural hypothyroidism: Secondary | ICD-10-CM | POA: Diagnosis not present

## 2022-12-27 DIAGNOSIS — I10 Essential (primary) hypertension: Secondary | ICD-10-CM

## 2022-12-27 DIAGNOSIS — R2241 Localized swelling, mass and lump, right lower limb: Secondary | ICD-10-CM

## 2022-12-27 LAB — MICROSCOPIC EXAMINATION
Epithelial Cells (non renal): NONE SEEN /hpf (ref 0–10)
RBC, Urine: NONE SEEN /hpf (ref 0–2)
Renal Epithel, UA: NONE SEEN /hpf

## 2022-12-27 LAB — URINALYSIS, ROUTINE W REFLEX MICROSCOPIC
Bilirubin, UA: NEGATIVE
Glucose, UA: NEGATIVE
Ketones, UA: NEGATIVE
Nitrite, UA: NEGATIVE
Protein,UA: NEGATIVE
RBC, UA: NEGATIVE
Specific Gravity, UA: 1.02 (ref 1.005–1.030)
Urobilinogen, Ur: 0.2 mg/dL (ref 0.2–1.0)
pH, UA: 7.5 (ref 5.0–7.5)

## 2022-12-27 MED ORDER — HYDROXYZINE HCL 25 MG PO TABS: 25.0000 mg | ORAL_TABLET | Freq: Two times a day (BID) | ORAL | 3 refills | Status: DC

## 2022-12-27 MED ORDER — ROSUVASTATIN CALCIUM 5 MG PO TABS
5.0000 mg | ORAL_TABLET | Freq: Every day | ORAL | 2 refills | Status: DC
Start: 2022-12-27 — End: 2024-02-29

## 2022-12-27 MED ORDER — MONTELUKAST SODIUM 10 MG PO TABS
10.0000 mg | ORAL_TABLET | Freq: Every day | ORAL | 2 refills | Status: DC
Start: 2022-12-27 — End: 2023-06-26

## 2022-12-27 MED ORDER — LEVOTHYROXINE SODIUM 125 MCG PO TABS: 125.0000 ug | ORAL_TABLET | Freq: Every day | ORAL | 0 refills | Status: DC

## 2022-12-27 MED ORDER — LEVOTHYROXINE SODIUM 125 MCG PO TABS
125.0000 ug | ORAL_TABLET | Freq: Every day | ORAL | 2 refills | Status: DC
Start: 2022-12-27 — End: 2022-12-29

## 2022-12-27 MED ORDER — BUPROPION HCL ER (XL) 300 MG PO TB24: 300.0000 mg | ORAL_TABLET | Freq: Every day | ORAL | 3 refills | Status: DC

## 2022-12-27 MED ORDER — MONTELUKAST SODIUM 10 MG PO TABS
10.0000 mg | ORAL_TABLET | Freq: Every day | ORAL | 3 refills | Status: DC
Start: 2022-12-27 — End: 2022-12-27

## 2022-12-27 MED ORDER — BUPROPION HCL ER (XL) 300 MG PO TB24
300.0000 mg | ORAL_TABLET | Freq: Every day | ORAL | 3 refills | Status: DC
Start: 2022-12-27 — End: 2023-06-26

## 2022-12-27 MED ORDER — TRAZODONE HCL 100 MG PO TABS
ORAL_TABLET | ORAL | 3 refills | Status: DC
Start: 2022-12-27 — End: 2022-12-27

## 2022-12-27 MED ORDER — LOSARTAN POTASSIUM 50 MG PO TABS: 50.0000 mg | ORAL_TABLET | Freq: Every day | ORAL | 2 refills | Status: DC

## 2022-12-27 MED ORDER — ROSUVASTATIN CALCIUM 5 MG PO TABS: 5.0000 mg | ORAL_TABLET | Freq: Every day | ORAL | 3 refills | Status: DC

## 2022-12-27 MED ORDER — LOSARTAN POTASSIUM 50 MG PO TABS
50.0000 mg | ORAL_TABLET | Freq: Every day | ORAL | 0 refills | Status: DC
Start: 2022-12-27 — End: 2022-12-27

## 2022-12-27 MED ORDER — HYDROXYZINE HCL 25 MG PO TABS
25.0000 mg | ORAL_TABLET | Freq: Two times a day (BID) | ORAL | 3 refills | Status: AC
Start: 2022-12-27 — End: 2023-04-26

## 2022-12-27 MED ORDER — BETAMETHASONE DIPROPIONATE 0.05 % EX CREA: TOPICAL_CREAM | Freq: Two times a day (BID) | CUTANEOUS | 2 refills | Status: DC

## 2022-12-27 MED ORDER — TRAZODONE HCL 100 MG PO TABS
ORAL_TABLET | ORAL | 2 refills | Status: DC
Start: 2022-12-27 — End: 2024-01-01

## 2022-12-27 NOTE — Progress Notes (Signed)
New Patient Office Visit  Subjective   Patient ID: Belinda Turner, female    DOB: 1965/12/02  Age: 57 y.o. MRN: [id number]  CC:  Chief Complaint  Patient presents with   Medical Management of Chronic Issues   Cough    Started when she started on lisinopril    HPI [name] presents for management of chronic conditions Her main concern today is that she has had a chronic, nagging cough for the past two years. Of note, she started lisinopril approximately 2 years ago.   Hypertension  Takes her BP at home. Averages 130/80.  ROS Denies fatigue, peripheral edema, chest pain, palpitations, sweats, PND, orthopnea, neck pain,  Meds Lisinopril  CAD risks  Endorses changes to vision, headaches, SOB   Asthma  Uses albuterol 2 times per day. States that she was helping her husband in his shop and felt she was exposed to mold. She continues to take her medications as prescribed. States that she was diagnosed in the 1990s/2000s and has never seen a pulmonologist.   Anxiety/Depression States that she has good days and bad days. She has two Autistic adult sons. Some days she has to step away to hide her anger. She states that her husband does not help much.   Bipolar Disorder  States that she was seeing Dr. Berneda Rose in [place]. Has family history with father and her 29 year old son. She states that she was taking a medication, but she was having a cough with it. States that she was having "parkinson" symptoms and coughing with medication and was taken off by [place]. She was taking Vraylar. Would like to restart something if she can because her anxiety and stress states that she doesn't want to "fly off the handle"   Thyroid: Patient presents for evaluation of hypothyroidism. Current symptoms include sweating, depression, goiter, ocular symptoms. Patient denies additional symtpoms  UTI like symptoms  States that it hurts to pee, started one week ago, but has increased in  the last 3 days. States that she is peeing more, but has increased her water intake. Denies the sensation as burning. Denies discharge. Endorses itching. Reports a total hysterectomy in 2010.    Outpatient Encounter Medications as of 12/27/2022  Medication Sig   albuterol (VENTOLIN HFA) 108 (90 Base) MCG/ACT inhaler TAKE 2 PUFFS BY MOUTH EVERY 6 HOURS AS NEEDED FOR WHEEZE OR SHORTNESS OF BREATH   betamethasone dipropionate 0.05 % cream Apply topically 2 (two) times daily.   buPROPion (WELLBUTRIN XL) 300 MG 24 hr tablet Take 1 tablet (300 mg total) by mouth daily.   cetirizine (ZYRTEC) 10 MG tablet Take 1 tablet (10 mg total) by mouth daily.   Chromium-Cinnamon (CINNAMON PLUS CHROMIUM) [phone] MCG-MG CAPS Take 1,000 mcg by mouth daily.   fluticasone (FLONASE) 50 MCG/ACT nasal spray SPRAY 2 SPRAYS INTO EACH NOSTRIL EVERY DAY   hydrOXYzine (ATARAX) 25 MG tablet Take 1 tablet (25 mg total) by mouth in the morning and at bedtime.   levothyroxine (SYNTHROID) 125 MCG tablet TAKE 1 TABLET BY MOUTH EVERY DAY   lisinopril (ZESTRIL) 20 MG tablet Take 1 tablet (20 mg total) by mouth daily.   magnesium oxide (MAG-OX) 400 MG tablet Take 400 mg by mouth daily.   montelukast (SINGULAIR) 10 MG tablet Take 1 tablet (10 mg total) by mouth at bedtime.   Multiple Vitamin (MULTIVITAMIN) tablet Take 1 tablet by mouth daily.   rosuvastatin (CRESTOR) 5 MG tablet Take 1 tablet (5 mg  total) by mouth daily.   traZODone (DESYREL) 100 MG tablet TAKE 1 TABLET BY MOUTH EVERYDAY AT BEDTIME   [DISCONTINUED] Meloxicam 15 MG TBDP    meloxicam (MOBIC) 15 MG tablet Take 15 mg by mouth daily.   [DISCONTINUED] amoxicillin-clavulanate (AUGMENTIN) 875-125 MG tablet Take 1 tablet by mouth 2 (two) times daily.   [DISCONTINUED] diclofenac (VOLTAREN) 75 MG EC tablet TAKE 1 TABLET BY MOUTH TWICE A DAY   No facility-administered encounter medications on file as of 12/27/2022.    Past Medical History:  Diagnosis Date   Asthma    Bipolar  1 disorder (HCC)    Depression    Hypertension    Migraine    Stroke (HCC) 2016   2 strokes   Thyroid disease     Past Surgical History:  Procedure Laterality Date   CESAREAN SECTION  2005   CESAREAN SECTION  2006   CHOLECYSTECTOMY  2014   COLONOSCOPY WITH PROPOFOL N/A 03/01/2020   Procedure: COLONOSCOPY WITH PROPOFOL;  Surgeon: Corbin Ade, MD;  Location: [facility];  Service: Endoscopy;  Laterality: N/A;  2:00pm   THYROIDECTOMY  2013   TOTAL ABDOMINAL HYSTERECTOMY  2010    Family History  Problem Relation Age of Onset   Depression Mother    Thyroid disease Mother    Diabetes Father    Thyroid disease Sister    ADD / ADHD Son    Autism Son    Bipolar disorder Son    Thyroid disease Maternal Grandmother    Breast cancer Maternal Grandmother    Diabetes Paternal Grandmother    Diabetes Paternal Grandfather    Colon cancer Neg Hx     Social History   Socioeconomic History   Marital status: Married    Spouse name: Not on file   Number of children: Not on file   Years of education: Not on file   Highest education level: Some college, no degree  Occupational History   Not on file  Tobacco Use   Smoking status: Former    Types: Cigarettes    Quit date: 1984    Years since quitting: 40.4   Smokeless tobacco: Never  Vaping Use   Vaping Use: Never used  Substance and Sexual Activity   Alcohol use: Yes    Comment: occ- 1-2 times a month.    Drug use: Never   Sexual activity: Yes    Birth control/protection: None  Other Topics Concern   Not on file  Social History Narrative   Not on file   Social Determinants of Health   Financial Resource Strain: Low Risk  (12/23/2022)   Overall Financial Resource Strain (CARDIA)    Difficulty of Paying Living Expenses: Not hard at all  Food Insecurity: No Food Insecurity (12/23/2022)   Hunger Vital Sign    Worried About Running Out of Food in the Last Year: Never true    Ran Out of Food in the Last Year: Never true   Transportation Needs: No Transportation Needs (12/23/2022)   PRAPARE - Administrator, Civil Service (Medical): No    Lack of Transportation (Non-Medical): No  Physical Activity: Insufficiently Active (12/23/2022)   Exercise Vital Sign    Days of Exercise per Week: 2 days    Minutes of Exercise per Session: 10 min  Stress: Stress Concern Present (12/23/2022)   [employer] of Occupational Health - Occupational Stress Questionnaire    Feeling of Stress : Very much  Social Connections: Moderately  Isolated (12/23/2022)   Social Connection and Isolation Panel [NHANES]    Frequency of Communication with Friends and Family: More than three times a week    Frequency of Social Gatherings with Friends and Family: Once a week    Attends Religious Services: Never    Database administrator or Organizations: No    Attends Engineer, structural: Not on file    Marital Status: Married  Catering manager Violence: Not on file    ROS As Per HPI    Objective   BP 134/84   Pulse (!) 106   Temp (!) 97.5 F (36.4 C) (Temporal)   Ht 5\' 6"  (1.676 m)   Wt 259 lb (117.5 kg)   SpO2 97%   BMI 41.80 kg/m  Vitals:   12/27/22 1109  BP: 134/84  Pulse: 95  Temp: (!) 97.5 F (36.4 C)  SpO2: 97%   Physical Exam Constitutional:      General: She is awake. She is not in acute distress.    Appearance: Normal appearance. She is well-developed and well-groomed. She is obese. She is not ill-appearing, toxic-appearing or diaphoretic.  Cardiovascular:     Rate and Rhythm: Normal rate.     Pulses: Normal pulses.          Radial pulses are 2+ on the right side and 2+ on the left side.       Posterior tibial pulses are 2+ on the right side and 2+ on the left side.     Heart sounds: Normal heart sounds. No murmur heard.    No gallop.  Pulmonary:     Effort: Pulmonary effort is normal. No respiratory distress.     Breath sounds: Normal breath sounds. No stridor. No wheezing, rhonchi or  rales.  Abdominal:     General: Bowel sounds are normal.     Palpations: Abdomen is soft.     Tenderness: There is no abdominal tenderness. There is no right CVA tenderness or left CVA tenderness.  Musculoskeletal:     Cervical back: Full passive range of motion without pain and neck supple.     Right knee: Swelling and effusion present. No erythema, ecchymosis or lacerations. Decreased range of motion. Tenderness present.     Right lower leg: Swelling present. 1+ Edema present.     Left lower leg: No edema.     Right ankle: Swelling present.     Comments: Generalized edema in right leg.   Skin:    General: Skin is warm.     Capillary Refill: Capillary refill takes less than 2 seconds.     Comments: Small round surgical scar over right iliac crest from prior nephrostomy tube.   Neurological:     General: No focal deficit present.     Mental Status: She is alert, oriented to person, place, and time and easily aroused. Mental status is at baseline.     GCS: GCS eye subscore is 4. GCS verbal subscore is 5. GCS motor subscore is 6.     Motor: No weakness.  Psychiatric:        Attention and Perception: Attention and perception normal.        Mood and Affect: Mood and affect normal.        Speech: Speech normal.        Behavior: Behavior normal. Behavior is cooperative.        Thought Content: Thought content normal. Thought content does not include homicidal or suicidal ideation. Thought  content does not include homicidal or suicidal plan.        Cognition and Memory: Cognition and memory normal.        Judgment: Judgment normal.        12/27/2022   11:06 AM 04/18/2022    1:39 PM 02/14/2022    3:09 PM  Depression screen PHQ 2/9  Decreased Interest 2 0 0  Down, Depressed, Hopeless 1 0 0  PHQ - 2 Score 3 0 0  Altered sleeping 2 1 0  Tired, decreased energy 1 0 0  Change in appetite 0 0 0  Feeling bad or failure about yourself  0 0 0  Trouble concentrating 0 0 0  Moving slowly or  fidgety/restless 0 0 0  Suicidal thoughts 0 0 0  PHQ-9 Score 6 1 0  Difficult doing work/chores Somewhat difficult Not difficult at all Not difficult at all      12/27/2022   11:07 AM 04/18/2022    1:40 PM 02/14/2022    3:10 PM 12/29/2021    3:31 PM  GAD 7 : Generalized Anxiety Score  Nervous, Anxious, on Edge 1 0 0 0  Control/stop worrying 0 0 0 0  Worry too much - different things 1 0 0 0  Trouble relaxing 1 0 0 0  Restless 0 0 0 0  Easily annoyed or irritable 0 0 0 0  Afraid - awful might happen 0 0 0 0  Total GAD 7 Score 3 0 0 0  Anxiety Difficulty Somewhat difficult Not difficult at all Not difficult at all Not difficult at all   The ASCVD Risk score (Arnett DK, et al., 2019) failed to calculate for the following reasons:   The patient has a prior MI or stroke diagnosis  Assessment & Plan:  1. Primary hypertension BP not at goal. Patient having cough with Lisinopril. Discussed with patient changing regimen. Will change to losartan as below. Provided written and verbal instruction for monitoring BP at home.  - losartan (COZAAR) 50 MG tablet; Take 1 tablet (50 mg total) by mouth daily.  Dispense: 90 tablet; Refill: 0  2. Major depressive disorder with single episode, in full remission (HCC) Medications refilled as below. Safety contract established. Denies SI. Declined referral to counseling. Accepted referral to psychiatry as below.  - buPROPion (WELLBUTRIN XL) 300 MG 24 hr tablet; Take 1 tablet (300 mg total) by mouth daily.  Dispense: 90 tablet; Refill: 3 - traZODone (DESYREL) 100 MG tablet; TAKE 1 TABLET BY MOUTH EVERYDAY AT BEDTIME  Dispense: 90 tablet; Refill: 3 - Ambulatory referral to Psychiatry  3. Bipolar 1 disorder (HCC) Currently not treated for Bipolar Disorder. She was previously taken off of Vraylar due to side effects, but was not referred to psychiatry. Will refer as below.  - hydrOXYzine (ATARAX) 25 MG tablet; Take 1 tablet (25 mg total) by mouth in the morning  and at bedtime.  Dispense: 180 tablet; Refill: 3 - Ambulatory referral to Psychiatry  4. Postoperative hypothyroidism Labs as below. Will communicate results to patient once available.  Refill as below.  - levothyroxine (SYNTHROID) 125 MCG tablet; Take 1 tablet (125 mcg total) by mouth daily.  Dispense: 90 tablet; Refill: 0 - Thyroid Panel With TSH - T4, Free  5. Mild intermittent asthma without complication Refill as below. Symptoms are well controlled. Patient has not received PFTs. Would like to refer to pulmonology for appropriate diagnosis.  - montelukast (SINGULAIR) 10 MG tablet; Take 1 tablet (10 mg total) by  mouth at bedtime.  Dispense: 90 tablet; Refill: 3 - Ambulatory referral to Pulmonology  6. History of stroke Tolerating crestor well. Prior Lipid panel within goal.  - rosuvastatin (CRESTOR) 5 MG tablet; Take 1 tablet (5 mg total) by mouth daily.  Dispense: 90 tablet; Refill: 3  7. Dysuria Will complete labs as below.  - Urinalysis, Routine w reflex microscopic - Urine Culture  8. Localized swelling of right lower leg Patient is established with Ortho. Declined referral to PT. States that she is being evaluated for knee replacement, but needs to lose some weight before she can do that.   9. Morbid obesity (HCC) Will discuss referral to nutrition in the future. Patient is trying to lose weight for knee surgery. Discussed diet and lifestyle changes.   The above assessment and management plan was discussed with the patient. The patient verbalized understanding of and has agreed to the management plan using shared-decision making. Patient is aware to call the clinic if they develop any new symptoms or if symptoms fail to improve or worsen. Patient is aware when to return to the clinic for a follow-up visit. Patient educated on when it is appropriate to go to the emergency department.   Return in about 2 months (around 02/26/2023) for Chronic Condition Follow up.   Neale Burly, DNP-FNP [facility] [address] [place], [place] [address] [phone]

## 2022-12-27 NOTE — Patient Instructions (Addendum)
Our records indicate that you are due for your screening mammogram.  Please call the imaging center that does your yearly mammograms to make an appointment for a mammogram at your earliest convenience. Our office also has a mobile unit through the [facility] that comes to our location. Please call our office if you would like to make an appointment.   Monitoring your BP at home   Your BP was elevated today in office  Please keep a log of your BP at home.  The best time to take BP is 1st thing in the morning after waking.  Sit for 5 minutes with feet flat on the floor, arm at heart level.  Options for BP cuffs are at [employer], [employer], [facility], [facility] & [facility].  We will review measurements at follow up and determine plan for BP management.  If you have access, you can send a message in MyChart with your measurements prior to your follow up appointment.  The brand I recommend to get is Omron (Bronze).

## 2022-12-27 NOTE — Addendum Note (Signed)
Addended by: Neale Burly on: 12/27/2022 05:20 PM   Modules accepted: Orders

## 2022-12-28 ENCOUNTER — Encounter: Payer: Self-pay | Admitting: Family Medicine

## 2022-12-28 ENCOUNTER — Ambulatory Visit: Payer: PRIVATE HEALTH INSURANCE | Admitting: Family Medicine

## 2022-12-28 LAB — THYROID PANEL WITH TSH: T4, Total: 13.5 ug/dL — ABNORMAL HIGH (ref 4.5–12.0)

## 2022-12-29 LAB — T4, FREE: Free T4: 1.76 ng/dL (ref 0.82–1.77)

## 2022-12-29 LAB — URINE CULTURE

## 2022-12-29 LAB — THYROID PANEL WITH TSH: TSH: 0.108 u[IU]/mL — ABNORMAL LOW (ref 0.450–4.500)

## 2022-12-29 MED ORDER — LEVOTHYROXINE SODIUM 112 MCG PO TABS: 112.0000 ug | ORAL_TABLET | Freq: Every day | ORAL | 2 refills | Status: DC

## 2022-12-29 NOTE — Addendum Note (Signed)
Addended by: Neale Burly on: 12/29/2022 09:07 AM   Modules accepted: Orders

## 2022-12-29 NOTE — Telephone Encounter (Signed)
Spoke w/pt and is aware of FNP's msg, voiced understanding.Nothing further.

## 2022-12-29 NOTE — Progress Notes (Signed)
No sign of UTI. Recommend increasing hydration 80-100 oz. Will decrease dose of levothyroxine to 112 mcg given abnormal labs and tachycardia on exam. Will recheck labs in 8 weeks. Please schedule lab appt.  Please call pharmacy and cancel other dose of synthroid. [phone]

## 2023-01-04 IMAGING — MG MM DIGITAL DIAGNOSTIC UNILAT*R* W/ TOMO W/ CAD
6 series · 6 of 18 positions shown · non-contrast
Comparison: Previous exams.

ACR Breast Density Category a: The breast tissue is almost entirely
fatty.

CLINICAL DATA: Screening recall for right breast asymmetry.

EXAM:
DIGITAL DIAGNOSTIC UNILATERAL RIGHT MAMMOGRAM WITH TOMOSYNTHESIS AND
CAD
TECHNIQUE: Right digital diagnostic mammography and breast tomosynthesis was
performed. The images were evaluated with computer-aided detection.

[R CC synth-2D (1 of 2)]
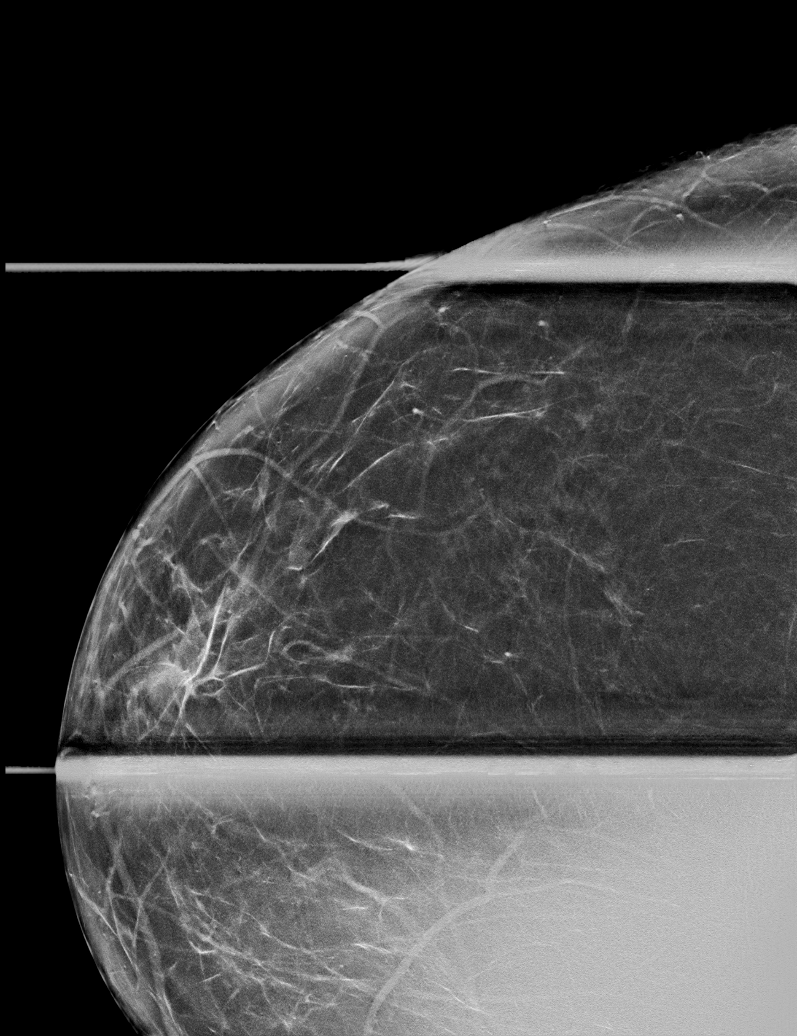

[R CC synth-2D (2 of 2)]
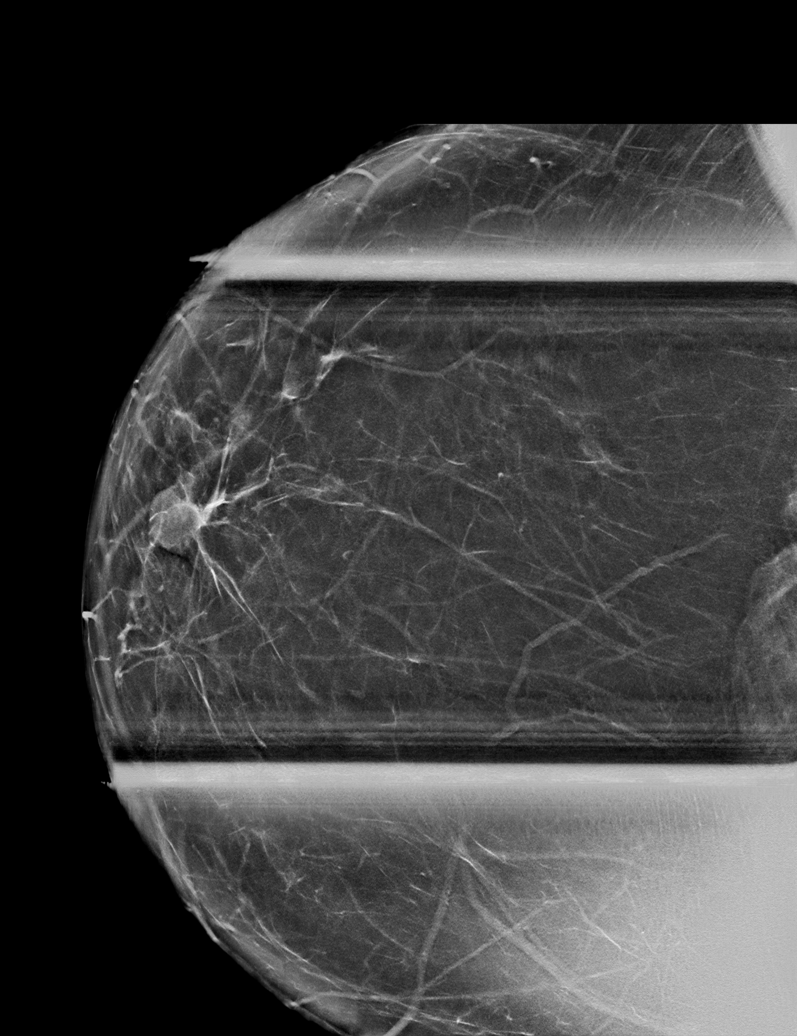

[R MLO synth-2D]
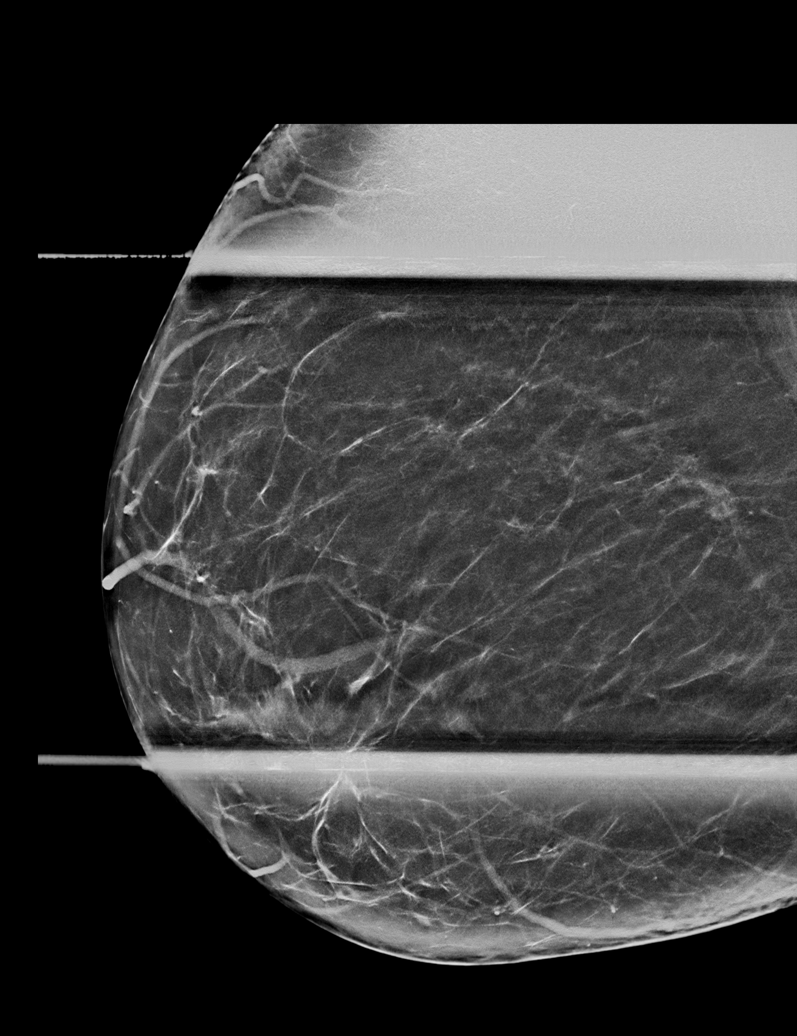

[R MLO tomo · tomo slice 35/69.0]
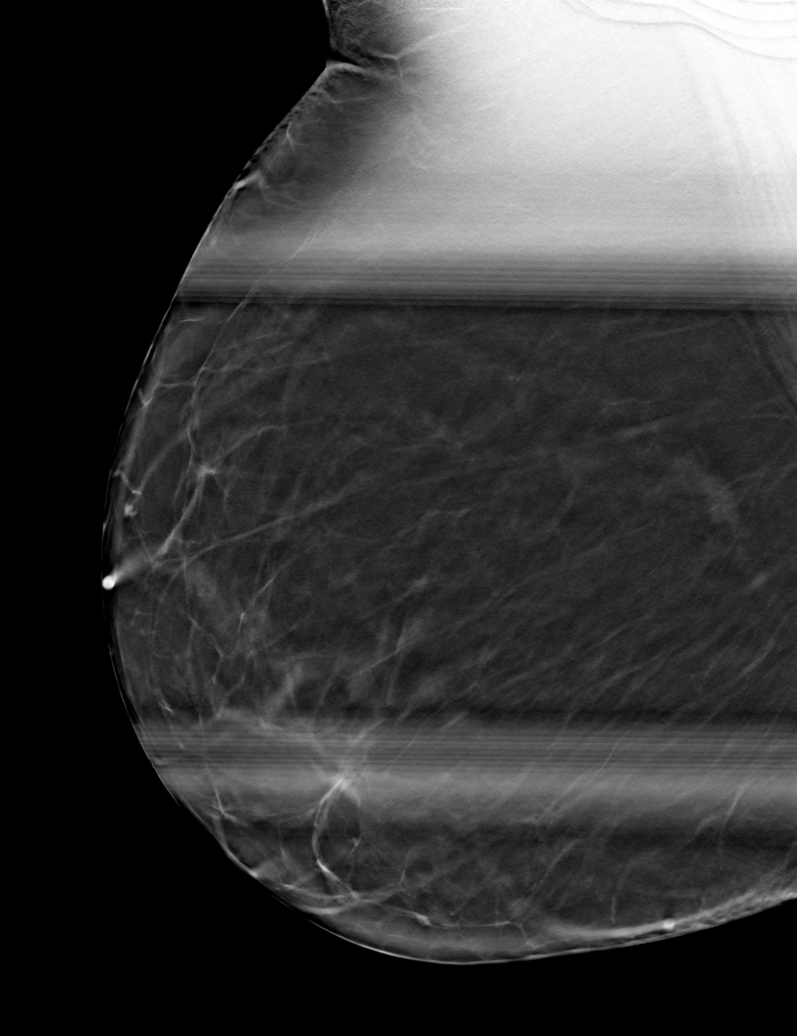

[R CC tomo (1 of 2) · tomo slice 35/69.0]
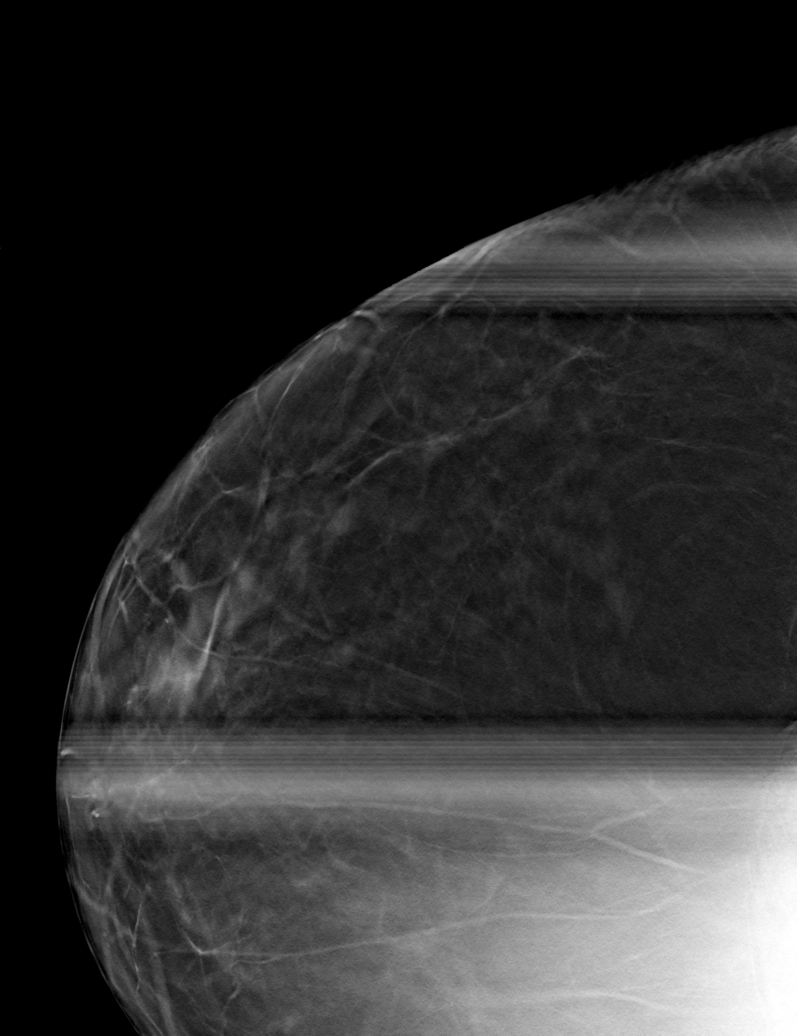

[R CC tomo (2 of 2) · tomo slice 41/80.0]
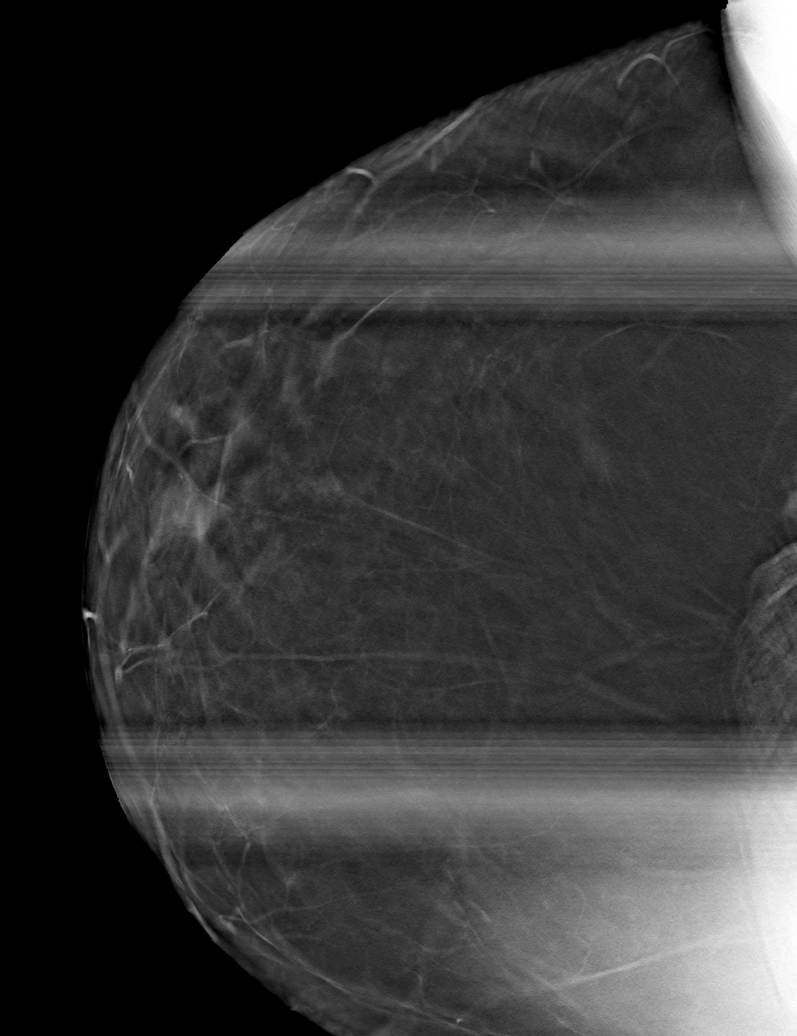

[6 of 18 positions shown; findings below may reference images not displayed]

FINDINGS: Spot compression tomograms were performed of the right breast. The
initially questioned possible right breast asymmetry resolves on the
additional imaging with the fibroglandular pattern unchanged in
appearance dating back to at least 7297. There is no evidence
malignancy in the right breast.
IMPRESSION: No mammographic evidence of malignancy in the right breast

RECOMMENDATION:
Screening mammogram in one year.(Code:AX-7-HBK)

I have discussed the findings and recommendations with the patient.
If applicable, a reminder letter will be sent to the patient
regarding the next appointment.

BI-RADS CATEGORY  1: Negative.

## 2023-02-01 ENCOUNTER — Ambulatory Visit (INDEPENDENT_AMBULATORY_CARE_PROVIDER_SITE_OTHER): Payer: PRIVATE HEALTH INSURANCE | Admitting: Internal Medicine

## 2023-02-01 ENCOUNTER — Encounter: Payer: Self-pay | Admitting: Internal Medicine

## 2023-02-01 VITALS — BP 124/86 | HR 87 | Ht 66.0 in | Wt 262.2 lb

## 2023-02-01 DIAGNOSIS — J4530 Mild persistent asthma, uncomplicated: Secondary | ICD-10-CM | POA: Diagnosis not present

## 2023-02-01 MED ORDER — AIRSUPRA 90-80 MCG/ACT IN AERO: 2.0000 | INHALATION_SPRAY | RESPIRATORY_TRACT | 0 refills | Status: AC | PRN

## 2023-02-01 MED ORDER — FLUCONAZOLE 100 MG PO TABS
100.0000 mg | ORAL_TABLET | Freq: Every day | ORAL | 5 refills | Status: DC
Start: 2023-02-01 — End: 2023-05-10

## 2023-02-01 MED ORDER — BUDESONIDE-FORMOTEROL FUMARATE 80-4.5 MCG/ACT IN AERO
INHALATION_SPRAY | RESPIRATORY_TRACT | 12 refills | Status: AC
Start: 2023-02-01 — End: ?

## 2023-02-01 MED ORDER — AMOXICILLIN-POT CLAVULANATE 875-125 MG PO TABS
1.0000 | ORAL_TABLET | Freq: Two times a day (BID) | ORAL | 0 refills | Status: DC
Start: 2023-02-01 — End: 2023-05-10

## 2023-02-01 NOTE — Assessment & Plan Note (Signed)
Baseline wt 247 p 3rd child age 57   Body mass index is 42.32 kg/m.  -  trending down per pt, reinforced  Lab Results  Component Value Date   TSH 0.108 (L) 12/27/2022      Contributing to doe now and future risk of GERD and dvt/ PE  >>>   reviewed the need and the process to achieve and maintain neg calorie balance > defer f/u primary care including intermittently monitoring thyroid status            Each maintenance medication was reviewed in detail including emphasizing most importantly the difference between maintenance and prns and under what circumstances the prns are to be triggered using an action plan format where appropriate.  Total time for H and P, chart review, counseling, reviewing hfa device(s) and generating customized AVS unique to this office visit / same day charting = 60 min new pt eval

## 2023-02-01 NOTE — Progress Notes (Signed)
Belinda Turner, female    DOB: Jul 21, 1966    MRN: [id number]   Brief patient profile:  60   yowf  quit smoking age 57  with h/o asthma as child episodic with pos allergies shot and no need for maint /worse in spring summer but more chronic since 3rd IUP age 24 referred to pulmonary clinic in [place]  02/01/2023 by Belinda Burly NP  for asthma eval.   Baseline 247 p 4th child   History of Present Illness  02/01/2023  Pulmonary/ 1st office eval/ Belinda Turner / [place] [facility] avg maint on alb 3x daily plus flonase /  Chief Complaint  Patient presents with   Pulmonary Consult    Referred by Belinda Campi, FNP. Pt dc with Asthma age 65. She states has hx of bronchitis. She c/o increased SOB over the past 4 months.  She gets SOB even at rest or with walking 3/4 mile. She has had cough with green sputum x 4-5 months. She is using her albuterol inhaler 4 x per day.   Dyspnea:  walks neighborhood x 17 min and some hills stops 3 x  Cough: better off lisinopril / middle of day worse  Sleep: flat bed / 2 pillows  3-4 x per night woken by cough  SABA use: every 6 hours  02: none  Covid max vax   No obvious day to day or daytime pattern/variability or assoc  mucus plugs or hemoptysis or cp or chest tightness, subjective wheeze or overt sinus or hb symptoms.     Also denies any obvious fluctuation of symptoms with weather or environmental changes or other aggravating or alleviating factors except as outlined above   No unusual exposure hx or h/o childhood pna/ asthma or knowledge of premature birth.  Current Allergies, Complete Past Medical History, Past Surgical History, Family History, and Social History were reviewed in [employer] record.  ROS  The following are not active complaints unless bolded Hoarseness, sore throat, dysphagia, dental problems, itching, sneezing,  nasal congestion or discharge of excess mucus or purulent secretions, ear ache,   fever, chills,  sweats, unintended wt loss or wt gain, classically pleuritic or exertional cp,  orthopnea pnd or arm/hand swelling  or leg swelling, presyncope, palpitations, abdominal pain, anorexia, nausea, vomiting, diarrhea  or change in bowel habits or change in bladder habits, change in stools or change in urine, dysuria, hematuria,  rash, arthralgias esp R Knee, visual complaints, headache, numbness, weakness or ataxia or problems with walking or coordination,  change in mood or  memory.            Outpatient Medications Prior to Visit  Medication Sig Dispense Refill   albuterol (VENTOLIN HFA) 108 (90 Base) MCG/ACT inhaler TAKE 2 PUFFS BY MOUTH EVERY 6 HOURS AS NEEDED FOR WHEEZE OR SHORTNESS OF BREATH 6.7 each 2   buPROPion (WELLBUTRIN XL) 300 MG 24 hr tablet Take 1 tablet (300 mg total) by mouth daily. (Patient taking differently: Take 150 mg by mouth daily.) 30 tablet 3   cetirizine (ZYRTEC) 10 MG tablet Take 1 tablet (10 mg total) by mouth daily. 90 tablet 3   Chromium-Cinnamon (CINNAMON PLUS CHROMIUM) [phone] MCG-MG CAPS Take 1,000 mcg by mouth daily.     fluticasone (FLONASE) 50 MCG/ACT nasal spray SPRAY 2 SPRAYS INTO EACH NOSTRIL EVERY DAY 48 mL 1   hydrOXYzine (ATARAX) 25 MG tablet Take 1 tablet (25 mg total) by mouth in the morning and at bedtime. 60 tablet 3  levothyroxine (SYNTHROID) 112 MCG tablet Take 1 tablet (112 mcg total) by mouth daily before breakfast. 30 tablet 2   losartan (COZAAR) 50 MG tablet Take 1 tablet (50 mg total) by mouth daily. 30 tablet 2   magnesium oxide (MAG-OX) 400 MG tablet Take 400 mg by mouth daily.     meloxicam (MOBIC) 15 MG tablet Take 15 mg by mouth daily.     montelukast (SINGULAIR) 10 MG tablet Take 1 tablet (10 mg total) by mouth at bedtime. 30 tablet 2   Multiple Vitamin (MULTIVITAMIN) tablet Take 1 tablet by mouth daily.     rosuvastatin (CRESTOR) 5 MG tablet Take 1 tablet (5 mg total) by mouth daily. 30 tablet 2   traZODone (DESYREL) 100 MG tablet TAKE 1  TABLET BY MOUTH EVERYDAY AT BEDTIME 30 tablet 2   No facility-administered medications prior to visit.    Past Medical History:  Diagnosis Date   Asthma    Bipolar 1 disorder (HCC)    Depression    Hypertension    Migraine    Stroke (HCC) 2016   2 strokes   Thyroid disease       Objective:     BP 124/86 (BP Location: Left Arm, Cuff Size: Large)   Pulse 87   Ht 5\' 6"  (1.676 m)   Wt 262 lb 3.2 oz (118.9 kg)   SpO2 96% Comment: on RA  BMI 42.32 kg/m   SpO2: 96 % (on RA)  Pleasant amb MO (by BMI) wf nad / mild pseudowheeze   HEENT : Oropharynx  clear     Nasal turbinates mod edema   NECK :  without  apparent JVD/ palpable Nodes/TM    LUNGS: no acc muscle use,  Nl contour chest which is clear to A and P bilaterally without cough on insp or exp maneuvers   CV:  RRR  no s3 or murmur or increase in P2, and no edema   ABD:  obese soft and nontender with nl inspiratory excursion in the supine position. No bruits or organomegaly appreciated   MS:  Nl gait/ ext warm without deformities Or obvious joint restrictions  calf tenderness, cyanosis or clubbing    SKIN: warm and dry without lesions    NEURO:  alert, approp, nl sensorium with  no motor or cerebellar deficits apparent.       Assessment   No problem-specific Assessment & Plan notes found for this encounter.     Sandrea Hughs, MD 02/01/2023

## 2023-02-01 NOTE — Assessment & Plan Note (Signed)
Onset in childhood intermittent, became chronic p 3rd IUP age 58 - 02/01/2023  After extensive coaching inhaler device,  effectiveness =    75% (short ti) rec trial of symbicort 80 and prn airsupra vs albuterol depending on access   DDX of  difficult airways management almost all start with A and  include Adherence, Ace Inhibitors, Acid Reflux, Active Sinus Disease, Alpha 1 Antitripsin deficiency, Anxiety masquerading as Airways dz,  ABPA,  Allergy(esp in young), Aspiration (esp in elderly), Adverse effects of meds,  Active smoking or vaping, A bunch of PE's (a small clot burden can't cause this syndrome unless there is already severe underlying pulm or vascular dz with poor reserve) plus two Bs  = Bronchiectasis and Beta blocker use..and one C= CHF  Adherence is always the initial "prime suspect" and is a multilayered concern that requires a "trust but verify" approach in every patient - starting with knowing how to use medications, especially inhalers, correctly, keeping up with refills and understanding the fundamental difference between maintenance and prns vs those medications only taken for a very short course and then stopped and not refilled.  - see hfa teaching - return with all meds in hand using a trust but verify approach to confirm accurate Medication  Reconciliation The principal here is that until we are certain that the  patients are doing what we've asked, it makes no sense to ask them to do more.   ? Active sinusitis > rec augmentin x 10 days, low threshold for sinus ct/ ent eval esp if pseudowheeze no better  ? Allergic asthma > try low dose ICS since upper airway irritation a concern/ allergy f/u prn   ? Acid (or non-acid) GERD > always difficult to exclude as up to 75% of pts in some series report no assoc GI/ Heartburn symptoms> rec   diet restrictions/ reviewed and instructions given in writing.

## 2023-02-01 NOTE — Patient Instructions (Addendum)
Plan A = Automatic = Always=    Symbicort 80 Take 2 puffs first thing in am and then another 2 puffs about 12 hours later.    Work on inhaler technique:  relax and gently blow all the way out then take a nice smooth full deep breath back in, triggering the inhaler at same time you start breathing in.  Hold breath in for at least  5 seconds if you can. Blow out symbicort  thru nose. Rinse and gargle with water when done.  If mouth or throat bother you at all,  try brushing teeth/gums/tongue with arm and hammer toothpaste/ make a slurry and gargle and spit out.   >>>  Remember how golfers warm up by taking practice swings - do this with an empty inhaler 3 x before using the real device.     Plan B = Backup (to supplement plan A, not to replace it) Only use your albuterol inhaler (or Air Supra) as a rescue medication to be used if you can't catch your breath by resting or doing a relaxed purse lip breathing pattern.  - The less you use it, the better it will work when you need it. - Ok to use the inhaler up to 2 puffs  every 4 hours if you must but call for appointment if use goes up over your usual need - Don't leave home without it !!  (think of it like the spare tire for your car)    Augmentin 875 mg take one pill twice daily  X 10 days - take at breakfast and supper with large glass of water.  It would help reduce the usual side effects (diarrhea and yeast infections) if you ate cultured yogurt at lunch.   No mint menthol or chocolate - sugarless jolly ranchers or sips of water to stop the throat   Please schedule a follow up office visit in 6 weeks, call sooner if needed - please bring inhalers

## 2023-02-24 ENCOUNTER — Other Ambulatory Visit: Payer: Self-pay | Admitting: Family Medicine

## 2023-03-02 ENCOUNTER — Other Ambulatory Visit: Payer: PRIVATE HEALTH INSURANCE

## 2023-03-02 DIAGNOSIS — E890 Postprocedural hypothyroidism: Secondary | ICD-10-CM

## 2023-03-06 NOTE — Progress Notes (Signed)
Thyroid panel normal. Continue current dose. Will repeat in 3 months

## 2023-03-15 ENCOUNTER — Ambulatory Visit: Payer: PRIVATE HEALTH INSURANCE | Admitting: Internal Medicine

## 2023-04-03 NOTE — Progress Notes (Deleted)
Belinda Turner, female    DOB: 12-10-1965    MRN: [id number]   Brief patient profile:  57   yowf  quit smoking age 57  with h/o asthma as child episodic with pos allergies shot and no need for maint /worse in spring summer but more chronic since 3rd IUP age 10 referred to pulmonary clinic in [place]  02/01/2023 by Belinda Burly NP  for asthma eval.   Baseline 247 p 4th child   History of Present Illness  02/01/2023  Pulmonary/ 1st office eval/ Belinda Turner / [place] avg maint on alb 3x daily plus flonase /  Chief Complaint  Patient presents with   Pulmonary Consult    Referred by Belinda Campi, FNP. Pt dc with Asthma age 57. She states has hx of bronchitis. She c/o increased SOB over the past 4 months.  She gets SOB even at rest or with walking 3/4 mile. She has had cough with green sputum x 4-5 months. She is using her albuterol inhaler 4 x per day.   Dyspnea:  walks neighborhood x 17 min and some hills stops 3 x  Cough: better off lisinopril / middle of day worse  Sleep: flat bed / 2 pillows  3-4 x per night woken by cough  SABA use: every 6 hours  02: none  Covid max vax  Rec Plan A = Automatic = Always=    Symbicort 80 Take 2 puffs first thing in am and then another 2 puffs about 12 hours later.  Work on inhaler technique: Plan B = Backup (to supplement plan A, not to replace it) Only use your albuterol inhaler (or Air Supra) as a rescue medication   Augmentin 875 mg take one pill twice daily  X 10 days   No mint menthol or chocolate - sugarless jolly ranchers or sips of water to stop the throat   Please schedule a follow up office visit in 6 weeks, call sooner if needed - please bring inhalers     04/05/2023  f/u ov/Belinda Turner office/Belinda Turner re: *** maint on *** did *** bring inhalers  No chief complaint on file.   Dyspnea:  *** Cough: *** Sleeping: ***   resp cc  SABA use: *** 02: ***  Lung cancer screening: ***   No obvious day to day or daytime  variability or assoc excess/ purulent sputum or mucus plugs or hemoptysis or cp or chest tightness, subjective wheeze or overt sinus or hb symptoms.    Also denies any obvious fluctuation of symptoms with weather or environmental changes or other aggravating or alleviating factors except as outlined above   No unusual exposure hx or h/o childhood pna/ asthma or knowledge of premature birth.  Current Allergies, Complete Past Medical History, Past Surgical History, Family History, and Social History were reviewed in [employer] record.  ROS  The following are not active complaints unless bolded Hoarseness, sore throat, dysphagia, dental problems, itching, sneezing,  nasal congestion or discharge of excess mucus or purulent secretions, ear ache,   fever, chills, sweats, unintended wt loss or wt gain, classically pleuritic or exertional cp,  orthopnea pnd or arm/hand swelling  or leg swelling, presyncope, palpitations, abdominal pain, anorexia, nausea, vomiting, diarrhea  or change in bowel habits or change in bladder habits, change in stools or change in urine, dysuria, hematuria,  rash, arthralgias, visual complaints, headache, numbness, weakness or ataxia or problems with walking or coordination,  change in mood or  memory.  No outpatient medications have been marked as taking for the 04/05/23 encounter (Appointment) with Belinda Cowden, MD.          Past Medical History:  Diagnosis Date   Asthma    Bipolar 1 disorder (HCC)    Depression    Hypertension    Migraine    Stroke [facility]) 2016   2 strokes   Thyroid disease       Objective:    Wts   04/05/2023       ***   02/01/23 262 lb 3.2 oz (118.9 kg)  12/27/22 259 lb (117.5 kg)  04/18/22 274 lb (124.3 kg)    Vital signs reviewed  04/05/2023  - Note at rest 02 sats  ***% on ***   General appearance:    ***        Assessment

## 2023-04-05 ENCOUNTER — Ambulatory Visit: Payer: PRIVATE HEALTH INSURANCE | Admitting: Internal Medicine

## 2023-04-26 ENCOUNTER — Other Ambulatory Visit: Payer: Self-pay | Admitting: Internal Medicine

## 2023-04-26 DIAGNOSIS — I10 Essential (primary) hypertension: Secondary | ICD-10-CM

## 2023-05-10 ENCOUNTER — Encounter: Payer: Self-pay | Admitting: Nurse Practitioner

## 2023-05-10 ENCOUNTER — Ambulatory Visit (INDEPENDENT_AMBULATORY_CARE_PROVIDER_SITE_OTHER): Payer: PRIVATE HEALTH INSURANCE | Admitting: Nurse Practitioner

## 2023-05-10 VITALS — BP 139/82 | HR 100 | Temp 97.3°F | Ht 66.0 in | Wt 282.2 lb

## 2023-05-10 DIAGNOSIS — J0120 Acute ethmoidal sinusitis, unspecified: Secondary | ICD-10-CM

## 2023-05-10 DIAGNOSIS — J029 Acute pharyngitis, unspecified: Secondary | ICD-10-CM

## 2023-05-10 DIAGNOSIS — R0981 Nasal congestion: Secondary | ICD-10-CM

## 2023-05-10 DIAGNOSIS — R051 Acute cough: Secondary | ICD-10-CM

## 2023-05-10 HISTORY — DX: Nasal congestion: R09.81

## 2023-05-10 HISTORY — DX: Acute pharyngitis, unspecified: J02.9

## 2023-05-10 HISTORY — DX: Acute ethmoidal sinusitis, unspecified: J01.20

## 2023-05-10 LAB — VERITOR FLU A/B WAIVED
Influenza A: NEGATIVE
Influenza B: NEGATIVE

## 2023-05-10 LAB — CULTURE, GROUP A STREP

## 2023-05-10 LAB — RAPID STREP SCREEN (MED CTR MEBANE ONLY): Strep Gp A Ag, IA W/Reflex: NEGATIVE

## 2023-05-10 MED ORDER — FLUCONAZOLE 100 MG PO TABS: 100.0000 mg | ORAL_TABLET | Freq: Every day | ORAL | 5 refills | Status: DC

## 2023-05-10 MED ORDER — AMOXICILLIN 500 MG PO TABS: 500.0000 mg | ORAL_TABLET | Freq: Two times a day (BID) | ORAL | 0 refills | Status: DC

## 2023-05-10 NOTE — Progress Notes (Signed)
Acute Office Visit  Subjective:     Patient ID: Belinda Turner, female    DOB: 1965-12-26, 57 y.o.   MRN: [id number]  Chief Complaint  Patient presents with   Nasal Congestion    Symptoms started 3-4 days ago   Cough   Sore Throat   Generalized Body Aches    HPI Belinda Turner is a 57 y.o. female who complains of congestion, sore throat, dry cough, and myalgias for 3 days. She denies a history of anorexia, chest pain, chills, dizziness, fevers, nausea, and vomiting and has a history of asthma. Patient denies smoke cigarettes. POC flu , Strep negative, awaiting fro COVID   Active Ambulatory Problems    Diagnosis Date Noted   Acquired hypothyroidism 06/26/2014   Bipolar 1 disorder (HCC)    Depression    Asthma    Primary hypertension 03/24/2020   Other fatigue 05/10/2020   Paresthesia 05/10/2020   History of stroke 03/31/2021   Osteoarthritis of right knee 03/21/2022   Morbid obesity due to excess calories (HCC) 02/01/2023   Acute non-recurrent ethmoidal sinusitis 05/10/2023   Sore throat 05/10/2023   Nasal congestion 05/10/2023   Resolved Ambulatory Problems    Diagnosis Date Noted   Colon cancer screening 12/10/2019   Past Medical History:  Diagnosis Date   Hypertension    Migraine    Stroke (HCC) 2016   Thyroid disease       Negative unless indicated in HPI    Objective:    BP 139/82   Pulse 100   Temp (!) 97.3 F (36.3 C) (Temporal)   Ht 5\' 6"  (1.676 m)   Wt 282 lb 3.2 oz (128 kg)   SpO2 98%   BMI 45.55 kg/m  BP Readings from Last 3 Encounters:  05/10/23 139/82  02/01/23 124/86  12/27/22 134/84   Wt Readings from Last 3 Encounters:  05/10/23 282 lb 3.2 oz (128 kg)  02/01/23 262 lb 3.2 oz (118.9 kg)  12/27/22 259 lb (117.5 kg)      Physical Exam She appears well, vital signs are as noted. Ears normal.  Throat and pharynx normal.  Neck supple. No adenopathy in the neck. Nose is congested. Sinuses tender. The chest is clear, without  wheezes or rales. Results for orders placed or performed in visit on 05/10/23  Rapid Strep Screen ([facility] ONLY)   Specimen: Other   Other  Result Value Ref Range   Strep Gp A Ag, IA W/Reflex Negative Negative  Culture, Group A Strep   Other  Result Value Ref Range   Strep A Culture CANCELED   Veritor Flu A/B Waived  Result Value Ref Range   Influenza A Negative Negative   Influenza B Negative Negative        Assessment & Plan:  Nasal congestion -     Novel Coronavirus, NAA ([facility]) -     Veritor Flu A/B Waived -     Amoxicillin; Take 1 tablet (500 mg total) by mouth 2 (two) times daily.  Dispense: 14 tablet; Refill: 0 -     Fluconazole; Take 1 tablet (100 mg total) by mouth daily.  Dispense: 3 tablet; Refill: 5  Sore throat -     Novel Coronavirus, NAA ([facility]) -     Veritor Flu A/B Waived -     Rapid Strep Screen ([facility] ONLY) -     Amoxicillin; Take 1 tablet (500 mg total) by mouth 2 (two) times daily.  Dispense: 14 tablet; Refill: 0 -     Fluconazole; Take 1 tablet (100 mg total) by mouth daily.  Dispense: 3 tablet; Refill: 5  Acute cough -     Novel Coronavirus, NAA ([facility]) -     Veritor Flu A/B Waived -     Amoxicillin; Take 1 tablet (500 mg total) by mouth 2 (two) times daily.  Dispense: 14 tablet; Refill: 0 -     Fluconazole; Take 1 tablet (100 mg total) by mouth daily.  Dispense: 3 tablet; Refill: 5  Acute non-recurrent ethmoidal sinusitis -     Amoxicillin; Take 1 tablet (500 mg total) by mouth 2 (two) times daily.  Dispense: 14 tablet; Refill: 0 -     Fluconazole; Take 1 tablet (100 mg total) by mouth daily.  Dispense: 3 tablet; Refill: 5  Other orders -     Culture, Group A Strep  Belinda Turner is a 57 yrs old caucasian female, no acute distress Sinusitis: Amoxicillin 500 mg BID for 7-days Increase hydration  Call or return to clinic prn if these symptoms worsen or fail to improve as anticipated. Return if symptoms worsen or fail to  improve.  Belinda Aran Santa Lighter, DNP [facility] [address] [place], [place] [address] [phone]

## 2023-05-11 LAB — NOVEL CORONAVIRUS, NAA: SARS-CoV-2, NAA: NOT DETECTED

## 2023-06-19 ENCOUNTER — Other Ambulatory Visit: Payer: Self-pay | Admitting: Family Medicine

## 2023-06-19 DIAGNOSIS — I10 Essential (primary) hypertension: Secondary | ICD-10-CM

## 2023-06-19 MED ORDER — LOSARTAN POTASSIUM 50 MG PO TABS: 50.0000 mg | ORAL_TABLET | Freq: Every day | ORAL | 0 refills | Status: DC

## 2023-06-19 NOTE — Addendum Note (Signed)
Addended by: Julious Payer D on: 06/19/2023 11:53 AM   Modules accepted: Orders

## 2023-06-19 NOTE — Telephone Encounter (Signed)
Gabrielle NTBS 30 days given 06/19/23

## 2023-06-19 NOTE — Telephone Encounter (Signed)
Pt scheduled for 08/15/2022

## 2023-06-24 ENCOUNTER — Other Ambulatory Visit: Payer: Self-pay | Admitting: Family Medicine

## 2023-06-26 ENCOUNTER — Ambulatory Visit (INDEPENDENT_AMBULATORY_CARE_PROVIDER_SITE_OTHER): Payer: BC Managed Care – PPO

## 2023-06-26 ENCOUNTER — Encounter: Payer: Self-pay | Admitting: Family Medicine

## 2023-06-26 ENCOUNTER — Ambulatory Visit: Payer: BC Managed Care – PPO | Admitting: Family Medicine

## 2023-06-26 VITALS — BP 145/69 | HR 122 | Temp 98.5°F | Ht 66.0 in | Wt 290.0 lb

## 2023-06-26 DIAGNOSIS — F324 Major depressive disorder, single episode, in partial remission: Secondary | ICD-10-CM | POA: Diagnosis not present

## 2023-06-26 DIAGNOSIS — Z136 Encounter for screening for cardiovascular disorders: Secondary | ICD-10-CM | POA: Diagnosis not present

## 2023-06-26 DIAGNOSIS — M25511 Pain in right shoulder: Secondary | ICD-10-CM

## 2023-06-26 DIAGNOSIS — E890 Postprocedural hypothyroidism: Secondary | ICD-10-CM | POA: Diagnosis not present

## 2023-06-26 DIAGNOSIS — M25562 Pain in left knee: Secondary | ICD-10-CM

## 2023-06-26 DIAGNOSIS — I10 Essential (primary) hypertension: Secondary | ICD-10-CM

## 2023-06-26 DIAGNOSIS — J4520 Mild intermittent asthma, uncomplicated: Secondary | ICD-10-CM

## 2023-06-26 DIAGNOSIS — M25462 Effusion, left knee: Secondary | ICD-10-CM | POA: Diagnosis not present

## 2023-06-26 DIAGNOSIS — M170 Bilateral primary osteoarthritis of knee: Secondary | ICD-10-CM | POA: Diagnosis not present

## 2023-06-26 LAB — BAYER DCA HB A1C WAIVED: HB A1C (BAYER DCA - WAIVED): 5.6 % (ref 4.8–5.6)

## 2023-06-26 MED ORDER — PREDNISONE 20 MG PO TABS: 40.0000 mg | ORAL_TABLET | Freq: Every day | ORAL | 0 refills | Status: AC

## 2023-06-26 NOTE — Progress Notes (Signed)
Subjective:  Patient ID: Belinda Turner, female    DOB: [birth year], 57 y.o.   MRN: [id number]  Patient Care Team: Arrie Senate, FNP as PCP - General (Family Medicine)   Chief Complaint:  Medical Management of Chronic Issues (Left knee and right shoulder pain)  HPI: Belinda Turner is a 57 y.o. female presenting on 06/26/2023 for Medical Management of Chronic Issues (Left knee and right shoulder pain)  HPI 1. Primary hypertension Has BP monitor at home Yes BP at home average 123/60 ROS Denies anxiety, fatigue, peripheral edema, changes to vision, chest pain, headaches, palpitations, sweats, SOB, PND, orthopnea Edema in left lower leg. Started one week ago.  Meds losartan - denies side effects.  CAD risks hypertension   2. Mild intermittent asthma without complication Asthma Follow-up: She has previously been evaluated here for asthma and presents for an asthma follow-up; she is not currently in exacerbation. Symptoms currently include  none  and occur  rarely . Observed precipitants include  mold .  Current limitations in activity from asthma: none.  Number of days of school or work missed in the last month: 0. Number of Emergency Department visits in the previous month: none. Frequency of use of quick-relief meds: 1. The patient reports adherence to this regimen  3. Acquired hypothyroidism Thyroid: Patient presents for evaluation of hypothyroidism. Current symptoms include weight gain. Patient denies fatigue, weight gain, feeling cold and cold intolerance, swelling, anxiousness, feeling excessive energy, tremulousness, palpitations, sweating, weight loss, change in skin,  nails, or hair, heat intolerance, depression, goiter, ocular symptoms.   4. Major depressive disorder with single episode, in partial remission (HCC) Reports that 5 months ago she tapered off of Wellbutrin. States that she is doing well. Denies SI.  Continues to take trazodone. States that it  works really well for her. Does not wish to be on medication or to start counseling at this time.   5. Morbid obesity due to excess calories (HCC) Denies any changes to diet and lifestyle.  States that she eats breakfast in am. Snacks on crackers during the day. Tries to eat early in the evenings.    6. Knee pain  States that her left knee hurts. Started before thanksgiving. Approximately one week ago. Trying tylenol arthritis. Interrupting sleep. Denies trauma or fall.  Is also taking meloxicam and is not helping.   7. Shoulder pain Started ~one week ago. Reports decreased ROM. States that she woke up one morning and it started hurting. Radiates to front of chest and down to fingers. States that it is swollen. Tylenol arthritis is not helping. Continues to take daily meloxicam.   Relevant past medical, surgical, family, and social history reviewed and updated as indicated.  Allergies and medications reviewed and updated. Data reviewed: Chart in Epic.   Past Medical History:  Diagnosis Date   Acute non-recurrent ethmoidal sinusitis 05/10/2023   Asthma    Bipolar 1 disorder (HCC)    Depression    Hypertension    Migraine    Nasal congestion 05/10/2023   Sore throat 05/10/2023   Stroke (HCC) 2016   2 strokes   Thyroid disease     Past Surgical History:  Procedure Laterality Date   CESAREAN SECTION  2005   CESAREAN SECTION  2006   CHOLECYSTECTOMY  2014   COLONOSCOPY WITH PROPOFOL N/A 03/01/2020   Procedure: COLONOSCOPY WITH PROPOFOL;  Surgeon: Corbin Ade, MD;  Location: [facility];  Service: Endoscopy;  Laterality: N/A;  2:00pm   THYROIDECTOMY  2013   TOTAL ABDOMINAL HYSTERECTOMY  2010    Social History   Socioeconomic History   Marital status: Married    Spouse name: Not on file   Number of children: Not on file   Years of education: Not on file   Highest education level: Some college, no degree  Occupational History   Not on file  Tobacco Use   Smoking  status: Former    Current packs/day: 0.00    Types: Cigarettes    Quit date: 108    Years since quitting: 40.9   Smokeless tobacco: Never  Vaping Use   Vaping status: Never Used  Substance and Sexual Activity   Alcohol use: Yes    Comment: occ- 1-2 times a month.    Drug use: Never   Sexual activity: Yes    Birth control/protection: None  Other Topics Concern   Not on file  Social History Narrative   Not on file   Social Determinants of Health   Financial Resource Strain: Low Risk  (12/23/2022)   Overall Financial Resource Strain (CARDIA)    Difficulty of Paying Living Expenses: Not hard at all  Food Insecurity: No Food Insecurity (12/23/2022)   Hunger Vital Sign    Worried About Running Out of Food in the Last Year: Never true    Ran Out of Food in the Last Year: Never true  Transportation Needs: No Transportation Needs (12/23/2022)   PRAPARE - Administrator, Civil Service (Medical): No    Lack of Transportation (Non-Medical): No  Physical Activity: Insufficiently Active (12/23/2022)   Exercise Vital Sign    Days of Exercise per Week: 2 days    Minutes of Exercise per Session: 10 min  Stress: Stress Concern Present (12/23/2022)   [employer] of Occupational Health - Occupational Stress Questionnaire    Feeling of Stress : Very much  Social Connections: Moderately Isolated (12/23/2022)   Social Connection and Isolation Panel [NHANES]    Frequency of Communication with Friends and Family: More than three times a week    Frequency of Social Gatherings with Friends and Family: Once a week    Attends Religious Services: Never    Database administrator or Organizations: No    Attends Engineer, structural: Not on file    Marital Status: Married  Intimate Partner Violence: Not on file    Outpatient Encounter Medications as of 06/26/2023  Medication Sig   albuterol (VENTOLIN HFA) 108 (90 Base) MCG/ACT inhaler TAKE 2 PUFFS BY MOUTH EVERY 6 HOURS AS NEEDED FOR  WHEEZE OR SHORTNESS OF BREATH   Albuterol-Budesonide (AIRSUPRA) 90-80 MCG/ACT AERO Inhale 2 puffs into the lungs every 4 (four) hours as needed.   amoxicillin (AMOXIL) 500 MG tablet Take 1 tablet (500 mg total) by mouth 2 (two) times daily.   budesonide-formoterol (SYMBICORT) 80-4.5 MCG/ACT inhaler Take 2 puffs first thing in am and then another 2 puffs about 12 hours later.   Chromium-Cinnamon (CINNAMON PLUS CHROMIUM) [phone] MCG-MG CAPS Take 1,000 mcg by mouth daily.   fluconazole (DIFLUCAN) 100 MG tablet Take 1 tablet (100 mg total) by mouth daily.   fluticasone (FLONASE) 50 MCG/ACT nasal spray SPRAY 2 SPRAYS INTO EACH NOSTRIL EVERY DAY   levothyroxine (SYNTHROID) 112 MCG tablet TAKE 1 TABLET BY MOUTH EVERY DAY BEFORE BREAKFAST   losartan (COZAAR) 50 MG tablet Take 1 tablet (50 mg total) by mouth daily.   magnesium oxide (MAG-OX) 400 MG tablet  Take 400 mg by mouth daily.   meloxicam (MOBIC) 15 MG tablet Take 15 mg by mouth daily.   Multiple Vitamin (MULTIVITAMIN) tablet Take 1 tablet by mouth daily.   rosuvastatin (CRESTOR) 5 MG tablet Take 1 tablet (5 mg total) by mouth daily.   traZODone (DESYREL) 100 MG tablet TAKE 1 TABLET BY MOUTH EVERYDAY AT BEDTIME   [DISCONTINUED] buPROPion (WELLBUTRIN XL) 300 MG 24 hr tablet Take 1 tablet (300 mg total) by mouth daily. (Patient taking differently: Take 150 mg by mouth daily.)   [DISCONTINUED] cetirizine (ZYRTEC) 10 MG tablet Take 1 tablet (10 mg total) by mouth daily.   [DISCONTINUED] montelukast (SINGULAIR) 10 MG tablet Take 1 tablet (10 mg total) by mouth at bedtime.   No facility-administered encounter medications on file as of 06/26/2023.    Allergies  Allergen Reactions   Molds & Smuts Cough, Hives, Palpitations, Shortness Of Breath and Swelling   Cortisone Nausea And Vomiting and Swelling   Elemental Sulfur Nausea And Vomiting and Swelling   Lisinopril Cough   Vraylar [Cariprazine] Other (See Comments)    EPS, parkinsonism like symptoms  per patient    Review of Systems As per HPI  Objective:  BP (!) 145/69   Pulse (!) 122 Comment: 122 -107  Temp 98.5 F (36.9 C)   Ht 5\' 6"  (1.676 m)   Wt 290 lb (131.5 kg)   SpO2 94%   BMI 46.81 kg/m    Wt Readings from Last 3 Encounters:  06/26/23 290 lb (131.5 kg)  05/10/23 282 lb 3.2 oz (128 kg)  02/01/23 262 lb 3.2 oz (118.9 kg)   Physical Exam Constitutional:      General: She is awake. She is not in acute distress.    Appearance: Normal appearance. She is well-developed and well-groomed. She is not ill-appearing, toxic-appearing or diaphoretic.  Cardiovascular:     Rate and Rhythm: Regular rhythm. Tachycardia present.     Pulses: Normal pulses.          Radial pulses are 2+ on the right side and 2+ on the left side.       Posterior tibial pulses are 2+ on the right side and 2+ on the left side.     Heart sounds: Normal heart sounds. No murmur heard.    No gallop.     Comments: Nonpitting edema  Pulmonary:     Effort: Pulmonary effort is normal. No respiratory distress.     Breath sounds: Normal breath sounds. No stridor. No wheezing, rhonchi or rales.  Musculoskeletal:     Right shoulder: Tenderness and crepitus present. No swelling, deformity, effusion or laceration. Decreased range of motion. Decreased strength.     Cervical back: Full passive range of motion without pain and neck supple.     Left knee: Swelling and crepitus present. No effusion, erythema or bony tenderness. Decreased range of motion. Tenderness present over the medial joint line and lateral joint line. Normal alignment and normal meniscus.     Right lower leg: No edema.     Left lower leg: Edema present.       Legs:     Comments: Lower extremity swelling non pitting Decrease strength and ROM due to pain. Pain with forward extension of arm and abduction.   Skin:    General: Skin is warm.     Capillary Refill: Capillary refill takes less than 2 seconds.  Neurological:     General: No focal  deficit present.     Mental  Status: She is alert, oriented to person, place, and time and easily aroused. Mental status is at baseline.     GCS: GCS eye subscore is 4. GCS verbal subscore is 5. GCS motor subscore is 6.     Motor: No weakness.  Psychiatric:        Attention and Perception: Attention and perception normal.        Mood and Affect: Mood and affect normal.        Speech: Speech normal.        Behavior: Behavior normal. Behavior is cooperative.        Thought Content: Thought content normal. Thought content does not include homicidal or suicidal ideation. Thought content does not include homicidal or suicidal plan.        Cognition and Memory: Cognition and memory normal.        Judgment: Judgment normal.    Results for orders placed or performed in visit on 05/10/23  Novel Coronavirus, NAA ([facility])   Specimen: Nasopharyngeal(NP) swabs in vial transport medium  Result Value Ref Range   SARS-CoV-2, NAA Not Detected Not Detected  Rapid Strep Screen ([facility] ONLY)   Specimen: Other   Other  Result Value Ref Range   Strep Gp A Ag, IA W/Reflex Negative Negative  Culture, Group A Strep   Other  Result Value Ref Range   Strep A Culture CANCELED   Veritor Flu A/B Waived  Result Value Ref Range   Influenza A Negative Negative   Influenza B Negative Negative       06/26/2023    8:58 AM 05/10/2023   10:49 AM 12/27/2022   11:06 AM 04/18/2022    1:39 PM 02/14/2022    3:09 PM  Depression screen PHQ 2/9  Decreased Interest 0 0 2 0 0  Down, Depressed, Hopeless 0 0 1 0 0  PHQ - 2 Score 0 0 3 0 0  Altered sleeping 0 1 2 1  0  Tired, decreased energy 0 1 1 0 0  Change in appetite 0 0 0 0 0  Feeling bad or failure about yourself  0 0 0 0 0  Trouble concentrating 0 0 0 0 0  Moving slowly or fidgety/restless 0 0 0 0 0  Suicidal thoughts 0 0 0 0 0  PHQ-9 Score 0 2 6 1  0  Difficult doing work/chores Not difficult at all Not difficult at all Somewhat difficult Not difficult  at all Not difficult at all       06/26/2023    8:59 AM 05/10/2023   10:49 AM 12/27/2022   11:07 AM 04/18/2022    1:40 PM  GAD 7 : Generalized Anxiety Score  Nervous, Anxious, on Edge 0 0 1 0  Control/stop worrying 0 0 0 0  Worry too much - different things 0 0 1 0  Trouble relaxing 0 1 1 0  Restless 0 0 0 0  Easily annoyed or irritable 0 0 0 0  Afraid - awful might happen 0 0 0 0  Total GAD 7 Score 0 1 3 0  Anxiety Difficulty Not difficult at all Not difficult at all Somewhat difficult Not difficult at all   Pertinent labs & imaging results that were available during my care of the patient were reviewed by me and considered in my medical decision making.  Assessment & Plan:  Taliyha was seen today for medical management of chronic issues.  Diagnoses and all orders for this visit:  Primary hypertension Not  at goal in office. Reports in goal at home. Will plan for patient to monitor BP at home and to bring measurements to office in 2 weeks. Will review measurements and determine next steps   Mild intermittent asthma without complication Well controlled. Continue current regimen.   Major depressive disorder with single episode, in partial remission (HCC) Well controlled. Denies SI. Patient to notify provider if they would like to restart treatment.   Morbid obesity due to excess calories [facility]) Discussed with patient to continue healthy lifestyle choices, including diet (rich in fruits, vegetables, and lean proteins, and low in salt and simple carbohydrates) and exercise (at least 30 minutes of moderate physical activity daily). Limit beverages high is sugar. Recommended at least 80-100 oz of water daily.  Labs as below. Will communicate results to patient once available. Will await results to determine next steps.  -     CMP14+EGFR -     CBC with Differential/Platelet -     VITAMIN D 25 Hydroxy (Vit-D Deficiency, Fractures) -     Bayer DCA Hb A1c Waived  Acute pain of left  knee Will start medication as below. Imaging as below. Will communicate results to patient once available. Will await results to determine next steps.  Declined referral to PT at this time.  -     predniSONE (DELTASONE) 20 MG tablet; Take 2 tablets (40 mg total) by mouth daily with breakfast for 5 days. -     DG Knee 1-2 Views Left  Acute pain of right shoulder Will start medication as below. Imaging as below. Will communicate results to patient once available. Will await results to determine next steps.  Declined referral to PT at this time.  -     predniSONE (DELTASONE) 20 MG tablet; Take 2 tablets (40 mg total) by mouth daily with breakfast for 5 days. -     DG Shoulder Right  Encounter for screening for cardiovascular disorders Labs as below. Will communicate results to patient once available. Will await results to determine next steps.  Not fasting  -     Lipid panel  Postoperative hypothyroidism Labs as below. Will communicate results to patient once available. Will await results to determine next steps.  -     TSH -     T3, Free  Continue all other maintenance medications.  Follow up plan: Return in about 6 months (around 12/25/2023) for Chronic Condition Follow up.  Continue healthy lifestyle choices, including diet (rich in fruits, vegetables, and lean proteins, and low in salt and simple carbohydrates) and exercise (at least 30 minutes of moderate physical activity daily).  Written and verbal instructions provided   The above assessment and management plan was discussed with the patient. The patient verbalized understanding of and has agreed to the management plan. Patient is aware to call the clinic if they develop any new symptoms or if symptoms persist or worsen. Patient is aware when to return to the clinic for a follow-up visit. Patient educated on when it is appropriate to go to the emergency department.   Neale Burly, DNP-FNP [facility]  Medicine [address] [place], [place] [address] [phone]

## 2023-06-27 ENCOUNTER — Encounter: Payer: Self-pay | Admitting: Family Medicine

## 2023-06-27 DIAGNOSIS — M25562 Pain in left knee: Secondary | ICD-10-CM

## 2023-06-27 DIAGNOSIS — M25511 Pain in right shoulder: Secondary | ICD-10-CM

## 2023-06-27 LAB — CMP14+EGFR
ALT: 28 [IU]/L (ref 0–32)
AST: 26 [IU]/L (ref 0–40)
Albumin: 4.2 g/dL (ref 3.8–4.9)
Alkaline Phosphatase: 74 [IU]/L (ref 44–121)
BUN/Creatinine Ratio: 20 (ref 9–23)
BUN: 18 mg/dL (ref 6–24)
Bilirubin Total: 0.3 mg/dL (ref 0.0–1.2)
CO2: 24 mmol/L (ref 20–29)
Calcium: 9.2 mg/dL (ref 8.7–10.2)
Chloride: 104 mmol/L (ref 96–106)
Creatinine, Ser: 0.89 mg/dL (ref 0.57–1.00)
Globulin, Total: 2.3 g/dL (ref 1.5–4.5)
Glucose: 73 mg/dL (ref 70–99)
Potassium: 4.3 mmol/L (ref 3.5–5.2)
Sodium: 141 mmol/L (ref 134–144)
Total Protein: 6.5 g/dL (ref 6.0–8.5)
eGFR: 75 mL/min/{1.73_m2} (ref >59)

## 2023-06-27 LAB — CBC WITH DIFFERENTIAL/PLATELET
Basophils Absolute: 0.1 10*3/uL (ref 0.0–0.2)
Basos: 1 %
EOS (ABSOLUTE): 0.2 10*3/uL (ref 0.0–0.4)
Eos: 3 %
Hematocrit: 42.4 % (ref 34.0–46.6)
Hemoglobin: 13.4 g/dL (ref 11.1–15.9)
Immature Grans (Abs): 0 10*3/uL (ref 0.0–0.1)
Immature Granulocytes: 0 %
Lymphocytes Absolute: 2.4 10*3/uL (ref 0.7–3.1)
Lymphs: 27 %
MCH: 27.5 pg (ref 26.6–33.0)
MCHC: 31.6 g/dL (ref 31.5–35.7)
MCV: 87 fL (ref 79–97)
Monocytes Absolute: 1 10*3/uL — ABNORMAL HIGH (ref 0.1–0.9)
Monocytes: 12 %
Neutrophils Absolute: 5 10*3/uL (ref 1.4–7.0)
Neutrophils: 57 %
Platelets: 255 10*3/uL (ref 150–450)
RBC: 4.88 x10E6/uL (ref 3.77–5.28)
RDW: 14.5 % (ref 11.7–15.4)
WBC: 8.7 10*3/uL (ref 3.4–10.8)

## 2023-06-27 LAB — VITAMIN D 25 HYDROXY (VIT D DEFICIENCY, FRACTURES): Vit D, 25-Hydroxy: 38.9 ng/mL (ref 30.0–100.0)

## 2023-06-27 LAB — LIPID PANEL
Chol/HDL Ratio: 2.7 {ratio} (ref 0.0–4.4)
Cholesterol, Total: 117 mg/dL (ref 100–199)
HDL: 44 mg/dL (ref >39)
LDL Chol Calc (NIH): 47 mg/dL (ref 0–99)
Triglycerides: 152 mg/dL — ABNORMAL HIGH (ref 0–149)
VLDL Cholesterol Cal: 26 mg/dL (ref 5–40)

## 2023-06-27 LAB — T3, FREE: T3, Free: 3.1 pg/mL (ref 2.0–4.4)

## 2023-06-27 LAB — TSH: TSH: 1.65 u[IU]/mL (ref 0.450–4.500)

## 2023-06-27 NOTE — Progress Notes (Signed)
Triglycerides slightly elevated. Diet encouraged - increase intake of fresh fruits and vegetables, increase intake of lean proteins. Bake, broil, or grill foods. Avoid fried, greasy, and fatty foods. Avoid fast foods. Increase intake of fiber-rich whole grains. Exercise encouraged - at least 150 minutes per week and advance as tolerated. Can also try fish oil and we will recheck in 3 months. Continue crestor due to prior MI or stroke diagnosis

## 2023-07-05 ENCOUNTER — Telehealth: Payer: Self-pay

## 2023-07-05 NOTE — Telephone Encounter (Signed)
Copied from CRM [id number]. Topic: Clinical - Lab/Test Results >> Jul 05, 2023  8:09 AM Maxwell Marion wrote: Reason for CRM: Pt called in regards to xray results. Says she needs to know what's going on, she can't take the pain much longer. The tylenol and ibuprofen are not helping. would like a call back at [phone]

## 2023-07-12 ENCOUNTER — Other Ambulatory Visit: Payer: Self-pay | Admitting: Family Medicine

## 2023-07-12 DIAGNOSIS — Z1231 Encounter for screening mammogram for malignant neoplasm of breast: Secondary | ICD-10-CM

## 2023-07-13 ENCOUNTER — Encounter: Payer: Self-pay | Admitting: Orthopedic Surgery

## 2023-07-13 ENCOUNTER — Ambulatory Visit
Admission: RE | Admit: 2023-07-13 | Discharge: 2023-07-13 | Disposition: A | Discharge status: Home / Self Care | Payer: BC Managed Care – PPO | Source: Ambulatory Visit | Attending: Family Medicine | Admitting: Family Medicine

## 2023-07-13 ENCOUNTER — Ambulatory Visit: Payer: BC Managed Care – PPO | Admitting: Orthopedic Surgery

## 2023-07-13 VITALS — BP 154/85 | HR 116 | Ht 66.0 in | Wt 288.0 lb

## 2023-07-13 DIAGNOSIS — Z1231 Encounter for screening mammogram for malignant neoplasm of breast: Secondary | ICD-10-CM

## 2023-07-13 DIAGNOSIS — M25511 Pain in right shoulder: Secondary | ICD-10-CM | POA: Diagnosis not present

## 2023-07-13 MED ORDER — CYCLOBENZAPRINE HCL 10 MG PO TABS: 10.0000 mg | ORAL_TABLET | Freq: Two times a day (BID) | ORAL | 0 refills | Status: DC | PRN

## 2023-07-13 MED ORDER — PREDNISONE 10 MG (21) PO TBPK: ORAL_TABLET | ORAL | 0 refills | Status: DC

## 2023-07-13 NOTE — Progress Notes (Signed)
Orthopaedic Clinic Return  Assessment: Belinda Turner is a 57 y.o. female with the following: Acute right shoulder pain  Plan: Belinda Turner has severe pain in the right shoulder.  Atraumatic onset.  It has been like this for the past month.  Very little motion causes discomfort.  She notes some swelling and hypersensitivity around the right shoulder, even with light touch.  Unclear what is causing her issues right now.  Radiographs previously obtained are negative for acute injury.  Given the severity of her pain at this time, I am recommending a prednisone Dosepak.  In addition, have given her prescription for Flexeril.  I would like see her back in 2 weeks for reassessment.  At that time, we may be able to consider some targeted injections around the right shoulder.  She states understanding.  She will follow-up in 2 weeks.  Meds ordered this encounter  Medications   predniSONE (STERAPRED UNI-PAK 21 TAB) 10 MG (21) TBPK tablet    Sig: 10 mg DS 12 as directed    Dispense:  48 tablet    Refill:  0   cyclobenzaprine (FLEXERIL) 10 MG tablet    Sig: Take 1 tablet (10 mg total) by mouth 2 (two) times daily as needed.    Dispense:  20 tablet    Refill:  0    Body mass index is 46.48 kg/m.  Follow-up: Return in about 2 weeks (around 07/27/2023).   Subjective:  Chief Complaint  Patient presents with   Shoulder Pain    R shoulder pain for 1 mo getting worse.     History of Present Illness: Belinda Turner is a 57 y.o. female who returns to clinic for evaluation of right shoulder pain.  I previously seen her for pain in her right knee.  She states that she has severe pain in the right shoulder.  This started approximately 1 month ago.  No specific injury.  No prior injuries to her right shoulder.  She has very limited motion.  She has tried heat, ice, NSAIDs, topical treatments all without improvement.  She notes that she has pain into the upper chest, which radiates through the  shoulder, and to the elbow.  Currently, she has some left knee problems, but this is not as bad as her right shoulder.  Of note, she states that her mother passed away a couple of weeks ago.  In addition, her stepmother passed away around Thanksgiving.  Emotionally, she is struggling, understandably.  Review of Systems: No fevers or chills No numbness or tingling No chest pain No shortness of breath No bowel or bladder dysfunction No GI distress No headaches   Objective: BP (!) 154/85   Pulse (!) 116   Ht 5\' 6"  (1.676 m)   Wt 288 lb (130.6 kg)   BMI 46.48 kg/m   Physical Exam:  Oriented.  No acute distress.  Expresses some emotion as she is grieving.  Right shoulder with mild diffuse swelling.  Mild redness.  No bruising.  Diffusely tender to palpation.  She tolerates very little range of motion in her right shoulder.  She also has some tenderness into the trapezius on the right.  Fingers are warm and well-perfused.  Sensation intact throughout the right hand.  Sensation intact in the axillary nerve distribution.  IMAGING: I personally ordered and reviewed the following images:  X-rays of the right shoulder were previously obtained.  No acute injuries.  Minimal degenerative changes.  No evidence of proximal humeral migration.  X-rays of the left knee demonstrates some loss of joint space, and associated osteophytes.   Belinda Barre, MD 07/13/2023 9:29 AM

## 2023-07-27 ENCOUNTER — Encounter: Payer: Self-pay | Admitting: Orthopedic Surgery

## 2023-07-27 ENCOUNTER — Ambulatory Visit: Payer: BC Managed Care – PPO | Admitting: Orthopedic Surgery

## 2023-07-27 DIAGNOSIS — M25511 Pain in right shoulder: Secondary | ICD-10-CM | POA: Diagnosis not present

## 2023-07-27 NOTE — Progress Notes (Signed)
 Orthopaedic Clinic Return  Assessment: Belinda Turner is a 58 y.o. female with the following: Acute right shoulder pain  Plan: [name] continues to have severe right shoulder pain.  Atraumatic onset, but could have started when she was helping lift her mother.  She has completed 2 weeks of prednisone  with limited improvement or change in her symptoms.  Tenderness is diffuse, as is the pain extending from the chest and the shoulder, and radiating distally into the arm.  At this point, I am recommending a steroid injection to see if we can get some resolution of her pain.  She is in agreement with this plan.  Injection was completed today without issues.  She will return to clinic in about a month.  Procedure note injection - Right shoulder    Verbal consent was obtained to inject the right shoulder, subacromial space Timeout was completed to confirm the site of injection.   The skin was prepped with alcohol and ethyl chloride was sprayed at the injection site.  A 21-gauge needle was used to inject 40 mg of Depo-Medrol  and 1% lidocaine  (4 cc) into the subacromial space of the right shoulder using a posterolateral approach.  There were no complications.  A sterile bandage was applied.      Follow-up: Return in about 4 weeks (around 08/24/2023).   Subjective:  Chief Complaint  Patient presents with   Shoulder Pain    R shoulder about the same after steroids and muscle relaxer.   NDC: [address]-8    History of Present Illness: Belinda Turner is a 58 y.o. female who returns to clinic for evaluation of right shoulder pain.  I saw her in clinic a couple weeks ago.  She has almost completed a short course of prednisone .  She reports minimal improvement in her symptoms.  She continues to have pain and tenderness radiating from her chest through her shoulder, distally into her arm.  Restricted range of motion.  Review of Systems: No fevers or chills No numbness or  tingling No chest pain No shortness of breath No bowel or bladder dysfunction No GI distress No headaches   Objective: There were no vitals taken for this visit.  Physical Exam:  Alert and oriented.  No acute distress.    Right shoulder with mild diffuse swelling.  No redness.  No bruising.  Diffusely tender to palpation.  She tolerates very little range of motion in her right shoulder.  Fingers are warm and well-perfused.  Sensation intact throughout the right hand.  Sensation intact in the axillary nerve distribution.  IMAGING: I personally ordered and reviewed the following images:  No new imaging obtained today.   Belinda [name], MD 07/27/2023 12:49 PM

## 2023-07-27 NOTE — Patient Instructions (Signed)

## 2023-08-03 ENCOUNTER — Encounter: Payer: Self-pay | Admitting: Orthopedic Surgery

## 2023-08-03 MED ORDER — TRAMADOL HCL 50 MG PO TABS: 50.0000 mg | ORAL_TABLET | Freq: Four times a day (QID) | ORAL | 0 refills | Status: DC | PRN

## 2023-08-15 ENCOUNTER — Other Ambulatory Visit: Payer: Self-pay | Admitting: Family Medicine

## 2023-08-15 DIAGNOSIS — I10 Essential (primary) hypertension: Secondary | ICD-10-CM

## 2023-08-22 ENCOUNTER — Encounter: Payer: Self-pay | Admitting: Orthopedic Surgery

## 2023-08-22 ENCOUNTER — Ambulatory Visit: Payer: BC Managed Care – PPO | Admitting: Orthopedic Surgery

## 2023-08-22 VITALS — BP 166/91 | HR 103 | Ht 66.0 in | Wt 288.0 lb

## 2023-08-22 DIAGNOSIS — M25511 Pain in right shoulder: Secondary | ICD-10-CM

## 2023-08-22 MED ORDER — TRAMADOL HCL 50 MG PO TABS: 50.0000 mg | ORAL_TABLET | Freq: Four times a day (QID) | ORAL | 0 refills | Status: DC | PRN

## 2023-08-22 NOTE — Progress Notes (Signed)
Orthopaedic Clinic Return  Assessment: Belinda Turner is a 58 y.o. female with the following: Acute right shoulder pain  Plan: Belinda Turner continues to have severe right shoulder pain.  She notes some improvement following the injection.  She has very limited range of motion.  She has tried using the arm, with very limited function.  Onset of pain was after trying to help lift her mother.  I am concerned about a rotator cuff injury.  As such, I have recommended an MRI.  She will return to clinic once the MRI is complete.  I provided a refill of tramadol.  She will follow-up after the MRI.     Follow-up: Return for After MRI.   Subjective:  Chief Complaint  Patient presents with   Shoulder Pain    Right / pain upper arm and right shoulder    Arm Problem    Patient states entire right arm is swollen     History of Present Illness: Belinda Turner is a 58 y.o. female who returns to clinic for evaluation of right shoulder pain.  I have seen her in clinic a couple of times for her right shoulder.  She started having pain in November, after attempting to help lift her mother.  Since then, she has had severe pain.  Pain radiates from the superior aspect of the shoulder distally towards the elbow.  She has tried prednisone, which provided minimal improvement.  Subacromial steroid injection improved some of the radiating pains, but has not improved her function.  She is unable to exercise or use her right arm.  Review of Systems: No fevers or chills No numbness or tingling No chest pain No shortness of breath No bowel or bladder dysfunction No GI distress No headaches   Objective: BP (!) 166/91 Comment: patient states cuff got really tight and raised BP did not repeat  Pulse (!) 103   Ht 5\' 6"  (1.676 m)   Wt 288 lb (130.6 kg)   BMI 46.48 kg/m   Physical Exam:  Alert and oriented.  No acute distress.    Right shoulder without bruising.  Mild swelling is appreciated.   She is diffusely tender to palpation.  Forward flexion is limited to 70 degrees before it is too painful.  Strength testing is deferred due to the excessive pain.  Fingers are warm and well-perfused.  Active motion in the AIN/PIN/U nerve distribution.  Slight decrease sensation to the small finger.  IMAGING: I personally ordered and reviewed the following images:  No new imaging obtained today.   Oliver Barre, MD 08/22/2023 10:02 AM

## 2023-08-22 NOTE — Patient Instructions (Signed)
While we are working on your approval for MRI please go ahead and call to schedule your appointment with [facility] within at least one (1) week.   Central Scheduling [phone]

## 2023-08-29 ENCOUNTER — Ambulatory Visit (HOSPITAL_COMMUNITY)
Admission: RE | Admit: 2023-08-29 | Discharge: 2023-08-29 | Disposition: A | Discharge status: Home / Self Care | Payer: BC Managed Care – PPO | Source: Ambulatory Visit | Attending: Orthopedic Surgery | Admitting: Orthopedic Surgery

## 2023-08-29 DIAGNOSIS — M25511 Pain in right shoulder: Secondary | ICD-10-CM | POA: Insufficient documentation

## 2023-08-29 DIAGNOSIS — M75111 Incomplete rotator cuff tear or rupture of right shoulder, not specified as traumatic: Secondary | ICD-10-CM | POA: Diagnosis not present

## 2023-08-29 DIAGNOSIS — M7581 Other shoulder lesions, right shoulder: Secondary | ICD-10-CM | POA: Diagnosis not present

## 2023-08-29 DIAGNOSIS — M75101 Unspecified rotator cuff tear or rupture of right shoulder, not specified as traumatic: Secondary | ICD-10-CM | POA: Diagnosis not present

## 2023-08-29 DIAGNOSIS — S46811A Strain of other muscles, fascia and tendons at shoulder and upper arm level, right arm, initial encounter: Secondary | ICD-10-CM | POA: Diagnosis not present

## 2023-09-03 ENCOUNTER — Encounter: Payer: Self-pay | Admitting: Orthopedic Surgery

## 2023-09-04 ENCOUNTER — Other Ambulatory Visit: Payer: Self-pay

## 2023-09-05 MED ORDER — TRAMADOL HCL 50 MG PO TABS: 50.0000 mg | ORAL_TABLET | Freq: Four times a day (QID) | ORAL | 0 refills | Status: DC | PRN

## 2023-09-06 NOTE — Telephone Encounter (Signed)
No other medications listed, just says alternative requested.

## 2023-09-06 NOTE — Telephone Encounter (Signed)
Letter received from [facility] requesting an alternate medication. Insurance max 2 fills within 60 days. Please advise.

## 2023-09-07 NOTE — Progress Notes (Signed)
Called to have images read for appointment.

## 2023-09-11 ENCOUNTER — Encounter: Payer: Self-pay | Admitting: Orthopedic Surgery

## 2023-09-11 ENCOUNTER — Ambulatory Visit: Payer: BC Managed Care – PPO | Admitting: Orthopedic Surgery

## 2023-09-11 DIAGNOSIS — M19011 Primary osteoarthritis, right shoulder: Secondary | ICD-10-CM

## 2023-09-11 DIAGNOSIS — M25511 Pain in right shoulder: Secondary | ICD-10-CM

## 2023-09-11 NOTE — Progress Notes (Signed)
 Orthopaedic Clinic Return  Assessment: Belinda Turner is a 58 y.o. female with the following: Acute right shoulder pain  Plan: Belinda Turner continues to have severe right shoulder pain.  MRI of the right shoulder demonstrates some small, partial-thickness injuries to the rotator cuff tendons, but more concerning is the moderate to severe glenohumeral joint arthritis that is described.  At this point, I am recommended an ultrasound-guided steroid injection, and I would like to see Dr. Shon Baton to have this injection completed.  She states her understanding.  I have urged her to pay attention to how much relief she gets and for how long, and hopefully the injection will go therapeutic and diagnostic.  She states understanding.  She can return to clinic depending on the efficacy in the injection.    Follow-up: Return if symptoms worsen or fail to improve; Referral to Dr. Shon Baton.   Subjective:  Chief Complaint  Patient presents with   Results    MRI R shoulder    History of Present Illness: Belinda Turner is a 58 y.o. female who returns to clinic for evaluation of right shoulder pain.  She has been in clinic several times for pain in her right shoulder.  She continues to have severe pain, rating it as a 10/10.  She does have some radiating pains from the shoulder to the elbow.  She continues to take tramadol, which helps with her pain.  It has been affecting her sleep.  She also has some numbness and tingling in her right hand.  She has obtained an MRI, and is here to discuss the findings.   Review of Systems: No fevers or chills + numbness or tingling No chest pain No shortness of breath No bowel or bladder dysfunction No GI distress No headaches   Objective: There were no vitals taken for this visit.  Physical Exam:  Alert and oriented.  No acute distress.    Right shoulder with mild swelling.  She is diffusely tender to palpation.  Forward flexion is limited to 70  degrees before it is too painful.  Strength testing is deferred due to the excessive pain.  Fingers are warm and well-perfused.  Active motion in the AIN/PIN/U nerve distribution.  Slight decrease sensation to the fingers on the right hand..  IMAGING: I personally ordered and reviewed the following images:  Right shoulder MRI  IMPRESSION: 1. Mild supraspinatus and infraspinatus tendinosis with tiny partial-thickness tears within the posterior supraspinatus tendon footprint and mid AP dimension of the infraspinatus tendon footprint. No tendon retraction. 2. 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 3. Mild degenerative changes of the acromioclavicular joint.   Oliver Barre, MD 09/11/2023 9:16 AM

## 2023-09-18 MED ORDER — TRAMADOL HCL 50 MG PO TABS: 50.0000 mg | ORAL_TABLET | Freq: Four times a day (QID) | ORAL | 0 refills | Status: DC | PRN

## 2023-09-18 NOTE — Addendum Note (Signed)
 Addended by: Thane Edu A on: 09/18/2023 10:34 AM   Modules accepted: Orders

## 2023-09-20 ENCOUNTER — Other Ambulatory Visit: Payer: Self-pay | Admitting: Family Medicine

## 2023-09-28 ENCOUNTER — Ambulatory Visit: Payer: BC Managed Care – PPO | Admitting: Sports Medicine

## 2023-09-28 ENCOUNTER — Encounter: Payer: Self-pay | Admitting: Sports Medicine

## 2023-09-28 ENCOUNTER — Other Ambulatory Visit: Payer: Self-pay

## 2023-09-28 DIAGNOSIS — M19011 Primary osteoarthritis, right shoulder: Secondary | ICD-10-CM | POA: Diagnosis not present

## 2023-09-28 DIAGNOSIS — G8929 Other chronic pain: Secondary | ICD-10-CM

## 2023-09-28 DIAGNOSIS — M12811 Other specific arthropathies, not elsewhere classified, right shoulder: Secondary | ICD-10-CM

## 2023-09-28 DIAGNOSIS — M25511 Pain in right shoulder: Secondary | ICD-10-CM | POA: Diagnosis not present

## 2023-09-28 MED ORDER — BUPIVACAINE HCL 0.25 % IJ SOLN
2.0000 mL | INTRAMUSCULAR | Status: AC | PRN
  Administered 2023-09-28: 2 mL via INTRA_ARTICULAR

## 2023-09-28 MED ORDER — METHYLPREDNISOLONE ACETATE 40 MG/ML IJ SUSP
80.0000 mg | INTRAMUSCULAR | Status: AC | PRN
  Administered 2023-09-28: 80 mg via INTRA_ARTICULAR

## 2023-09-28 MED ORDER — LIDOCAINE HCL 1 % IJ SOLN
2.0000 mL | INTRAMUSCULAR | Status: AC | PRN
  Administered 2023-09-28: 2 mL

## 2023-09-28 NOTE — Progress Notes (Signed)
 Belinda Turner - 58 y.o. female MRN [id number]  Date of birth: 1966-03-19  Office Visit Note: Visit Date: 09/28/2023 PCP: Arrie Senate, FNP Referred by: Oliver Barre, MD  Subjective: Chief Complaint  Patient presents with   Right Shoulder - Pain   HPI: Belinda Turner is a pleasant 58 y.o. female who presents today for acute on chronic right shoulder pain.   She has rather bothersome right shoulder pain.  She also states this will go down the arm as well.  It is even tender to touch.  She has been following my partner up in [place], Dr. Dallas Schimke, who ordered an MRI of the shoulder.  Ice is somewhat helpful.  She does have tramadol 50 mg which she takes every 6 hours only as needed for breakthrough pain.  She is also asking of osteoarthritis and osteoporosis are related.  Pertinent ROS were reviewed with the patient and found to be negative unless otherwise specified above in HPI.   Assessment & Plan: Visit Diagnoses:  1. Chronic right shoulder pain   2. Primary osteoarthritis, right shoulder   3. Rotator cuff arthropathy of right shoulder    Plan: Impression is acute on chronic right shoulder pain with MRI confirmed moderate to severe osteoarthritis with high-grade cartilage loss.  I did review her MRI with her today, and explained the nature of her small interstitial tears which do not have any tendon retraction or full-thickness tearing.  Through shared decision making, we did proceed with ultrasound-guided right intra-articular shoulder injection under ultrasound guidance.  Advised on 48 hours of modified rest/activity.  She may use ice, Tylenol and use her tramadol 50 mg for breakthrough pain.  She will follow back up with Dr. Dallas Schimke, notified to message/call over the next week or 2 to see what sort of improvement she has.   Follow-up: Return for f/u with Dr. Dallas Schimke.   Meds & Orders: No orders of the defined types were placed in this encounter.   Orders  Placed This Encounter  Procedures   Large Joint Inj: R glenohumeral   US Guided Needle Placement - No Linked Charges     Procedures: Large Joint Inj: R glenohumeral on 09/28/2023 10:45 AM Indications: pain Details: 22 G 3.5 in needle, ultrasound-guided posterior approach Medications: 2 mL lidocaine 1 %; 2 mL bupivacaine 0.25 %; 80 mg methylPREDNISolone acetate 40 MG/ML Outcome: tolerated well, no immediate complications  US-guided glenohumeral joint injection, right shoulder After discussion on risks/benefits/indications, informed verbal consent was obtained. A timeout was then performed. The patient was positioned lying lateral recumbent on examination table. The patient's shoulder was prepped with betadine and multiple alcohol swabs and utilizing ultrasound guidance, the patient's glenohumeral joint was identified on ultrasound. Using ultrasound guidance a 22-gauge, 3.5 inch needle with a mixture of 2:2:2 cc's lidocaine:bupivicaine:depomedrol was directed from a lateral to medial direction via in-plane technique into the glenohumeral joint with visualization of appropriate spread of injectate into the joint. Patient tolerated the procedure well without immediate complications.      Procedure, treatment alternatives, risks and benefits explained, specific risks discussed. Consent was given by the patient. Immediately prior to procedure a time out was called to verify the correct patient, procedure, equipment, support staff and site/side marked as required. Patient was prepped and draped in the usual sterile fashion.            Clinical History: No specialty comments available.  She reports that she quit smoking about 41 years ago. Her  smoking use included cigarettes. She has never used smokeless tobacco.  Recent Labs    06/26/23 0941  HGBA1C 5.6    Objective:    Physical Exam  Gen: Well-appearing, in no acute distress; non-toxic CV: Well-perfused. Warm.  Resp: Breathing  unlabored on room air; no wheezing. Psych: Fluid speech in conversation; appropriate affect; normal thought process  Ortho Exam - Right shoulder: There is diffuse tenderness to light palpation throughout the anterior and posterior shoulder.  There may be a small degree of soft tissue swelling but no redness.  I cannot palpate an effusion within the shoulder.  There is limited range of motion in all directions.  Imaging:  *I did personally review the MRI today during the office visit, also reviewed with the patient in the room  MR Shoulder Right Wo Contrast CLINICAL DATA:  Chronic right shoulder pain. Rotator cuff disorder suspected.  EXAM: MRI OF THE RIGHT SHOULDER WITHOUT CONTRAST  TECHNIQUE: Multiplanar, multisequence MR imaging of the shoulder was performed. No intravenous contrast was administered.  COMPARISON:  Right shoulder radiographs 06/26/2023  FINDINGS: Rotator cuff: Mild diffuse intermediate T2 signal tendinosis of the supraspinatus and infraspinatus tendons. Punctate superimposed midsubstance partial-thickness tear within the posterior supraspinatus tendon footprint measuring up to 2 mm in transverse dimension (coronal series 10, image 9) and 3 mm in AP dimension (sagittal series 12, image 15). There is a tiny partial-thickness articular sided tear of the mid AP dimension of the infraspinatus tendon footprint measuring up to 6 mm in AP dimension (sagittal series 12, image 15) and 3 mm in transverse dimension (coronal series 10, image 6). No supraspinatus or infraspinatus tendon retraction. Mild subcortical marrow edema within the humeral head deep to the infraspinatus greater than supraspinatus tendon tears. The subscapularis and teres minor are intact.  Muscles: No rotator cuff muscle atrophy, fatty infiltration, or edema.  Biceps long head: The intra-articular long head of the biceps tendon is intact.  Acromioclavicular Joint: There are mild degenerative  changes of the acromioclavicular joint including joint space narrowing, subchondral marrow edema, and peripheral osteophytosis. Type II acromion. No fluid within the subacromial/subdeltoid bursa.  Glenohumeral Joint: There is full-thickness cartilage loss throughout the superomedial aspect of the humeral head and region measuring up to approximately 1.6 cm in craniocaudal dimension. High-grade partial to full-thickness cartilage loss within the mid height of the humeral head measuring up to 10 mm in craniocaudal dimension. High-grade partial to full-thickness cartilage loss within the mid to posterior glenoid. There is a degenerative cyst measuring up to 5 mm within the mid to inferior aspect of the glenoid.  There is mild fluid within the subscapular recess with a 7 x 2 x 4 mm low signal focus may represent a loose body (sagittal series 12, image 9 and axial series 9, image 11).  Labrum: There is mild peripheral degenerative irregularity of the posterosuperior glenoid labrum.  Bones:  No acute fracture.  Other: None.  IMPRESSION: 1. Mild supraspinatus and infraspinatus tendinosis with tiny partial-thickness tears within the posterior supraspinatus tendon footprint and mid AP dimension of the infraspinatus tendon footprint. No tendon retraction. 2. 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 3. Mild degenerative changes of the acromioclavicular joint.  Electronically Signed   By: Neita Garnet M.D.   On: 09/07/2023 13:38    Past Medical/Family/Surgical/Social History: Medications & Allergies reviewed per EMR, new medications updated. Patient Active Problem List   Diagnosis Date Noted   Morbid obesity due to excess calories (HCC) 02/01/2023   Osteoarthritis of right  knee 03/21/2022   History of stroke 03/31/2021   Other fatigue 05/10/2020   Paresthesia 05/10/2020   Primary hypertension 03/24/2020   Bipolar 1 disorder (HCC)    Depression    Asthma     Postoperative hypothyroidism 06/26/2014   Past Medical History:  Diagnosis Date   Acute non-recurrent ethmoidal sinusitis 05/10/2023   Asthma    Bipolar 1 disorder (HCC)    Depression    Hypertension    Migraine    Nasal congestion 05/10/2023   Sore throat 05/10/2023   Stroke (HCC) 2016   2 strokes   Thyroid disease    Family History  Problem Relation Age of Onset   Depression Mother    Thyroid disease Mother    Diabetes Father    Thyroid disease Sister    ADD / ADHD Son    Autism Son    Bipolar disorder Son    Thyroid disease Maternal Grandmother    Breast cancer Maternal Grandmother    Diabetes Paternal Grandmother    Diabetes Paternal Grandfather    Colon cancer Neg Hx    Past Surgical History:  Procedure Laterality Date   CESAREAN SECTION  2005   CESAREAN SECTION  2006   CHOLECYSTECTOMY  2014   COLONOSCOPY WITH PROPOFOL N/A 03/01/2020   Procedure: COLONOSCOPY WITH PROPOFOL;  Surgeon: Corbin Ade, MD;  Location: [facility];  Service: Endoscopy;  Laterality: N/A;  2:00pm   THYROIDECTOMY  2013   TOTAL ABDOMINAL HYSTERECTOMY  2010   Social History   Occupational History   Not on file  Tobacco Use   Smoking status: Former    Current packs/day: 0.00    Types: Cigarettes    Quit date: 1984    Years since quitting: 41.2   Smokeless tobacco: Never  Vaping Use   Vaping status: Never Used  Substance and Sexual Activity   Alcohol use: Yes    Comment: occ- 1-2 times a month.    Drug use: Never   Sexual activity: Yes    Birth control/protection: None

## 2023-09-30 ENCOUNTER — Other Ambulatory Visit: Payer: Self-pay | Admitting: Orthopedic Surgery

## 2023-10-10 ENCOUNTER — Encounter: Payer: Self-pay | Admitting: Orthopedic Surgery

## 2023-10-12 MED ORDER — TRAMADOL HCL 50 MG PO TABS: 50.0000 mg | ORAL_TABLET | Freq: Four times a day (QID) | ORAL | 0 refills | Status: DC | PRN

## 2023-10-26 ENCOUNTER — Encounter: Payer: Self-pay | Admitting: Orthopedic Surgery

## 2023-10-26 ENCOUNTER — Ambulatory Visit: Admitting: Orthopedic Surgery

## 2023-10-26 ENCOUNTER — Other Ambulatory Visit (INDEPENDENT_AMBULATORY_CARE_PROVIDER_SITE_OTHER): Payer: Self-pay

## 2023-10-26 VITALS — Ht 66.0 in | Wt 293.0 lb

## 2023-10-26 DIAGNOSIS — M19011 Primary osteoarthritis, right shoulder: Secondary | ICD-10-CM | POA: Diagnosis not present

## 2023-10-26 DIAGNOSIS — G8929 Other chronic pain: Secondary | ICD-10-CM

## 2023-10-26 MED ORDER — TRAMADOL HCL 50 MG PO TABS: 50.0000 mg | ORAL_TABLET | Freq: Four times a day (QID) | ORAL | 0 refills | Status: DC | PRN

## 2023-10-26 NOTE — Progress Notes (Signed)
 Orthopaedic Clinic Return  Assessment: Belinda Turner is a 58 y.o. female with the following: Right glenohumeral arthritis  Plan: Belinda Turner continues to have severe right shoulder pain.  Updated radiographs demonstrates significant loss of glenohumeral joint space, some small inferior osteophytes.  MRI also demonstrates some full-thickness cartilage loss.  8 injections have not been able to provide sustained relief.  She continues to have issues.  We discussed the possibility proceeding with surgery.  Recommend right shoulder replacement.  The procedure was discussed in detail.  Cannot proceed for another 2 months because of the recent glenohumeral joint injection..  She is interested in July.  We will confirm medical clearance.  We will then obtain a CT scan.  I will see her back in clinic to discuss the final plan.    Follow-up: Return if symptoms worsen or fail to improve; Referral to Dr. Shon Baton.   Subjective:  Chief Complaint  Patient presents with   Shoulder Pain    R shoulder     History of Present Illness: Belinda Turner is a 58 y.o. female who returns to clinic for evaluation of right shoulder pain.  I have seen in clinic several times for pain in the right shoulder.  She has obtained MRI.  MRI demonstrated advanced arthritis.  She was evaluated by Dr. Shon Baton.  He completed an intra-articular steroid injection.  This provided improvement in her symptoms for couple days.  She continues to have pain.  She states she cannot live like this.  She continues to take tramadol.   Review of Systems: No fevers or chills + numbness or tingling No chest pain No shortness of breath No bowel or bladder dysfunction No GI distress No headaches   Objective: Ht 5\' 6"  (1.676 m)   Wt 293 lb (132.9 kg)   BMI 47.29 kg/m   Physical Exam:  Alert and oriented.  No acute distress.    Right shoulder with mild swelling.  She is diffusely tender to palpation.  Forward flexion is  limited to 70 degrees before it is too painful.  Strength testing is deferred due to the excessive pain.  Fingers are warm and well-perfused.  Active motion in the AIN/PIN/U nerve distribution.  Slight decrease sensation to the fingers on the right hand..  IMAGING: I personally ordered and reviewed the following images:  X-rays of the right shoulder were obtained in clinic today.  These are compared to available images.  No acute injuries are noted.  Complete loss of joint space within the glenohumeral joint.  There are some small inferior osteophytes.  No dislocation.  No proximal humeral migration.  No bony lesions.  Impression: Right shoulder x-rays with advanced degenerative changes   Oliver Barre, MD 10/26/2023 9:32 AM

## 2023-11-09 ENCOUNTER — Other Ambulatory Visit: Payer: Self-pay | Admitting: Orthopedic Surgery

## 2023-11-15 ENCOUNTER — Telehealth: Payer: Self-pay

## 2023-11-15 ENCOUNTER — Telehealth: Payer: Self-pay | Admitting: Orthopedic Surgery

## 2023-11-15 NOTE — Telephone Encounter (Signed)
 Dr. Ernesta Heading pt - spoke w/the pt, she stated that [facility] has been trying to get Dr. Tita Form regarding an alternative medication for Tramadol  since Monday and she needs it, she's out.

## 2023-11-15 NOTE — Telephone Encounter (Signed)
 Spoke with pt who states pharmacy needs a PA for medication, let pt know we have not received any PA requests for her. Called pharmacy and they gave me a # for PA that is needed for medication. Called [facility] to initiate a PA on CoverMyMeds. PA completed and approved, pt notified of approval.

## 2023-11-15 NOTE — Telephone Encounter (Signed)
 I spoke to Belinda Turner and advised she will ntbs fro Pre-op exam prior to filling any forms out and Belinda Turner scheduled with PCP 12/25/23 at 8:50 and Belinda Turner aware.

## 2023-11-15 NOTE — Telephone Encounter (Signed)
 Copied from CRM [id number]. Topic: Medical Record Request - Provider/Facility Request >> Nov 15, 2023  8:58 AM Felicitas Horse wrote: Reason for CRM: PT called in because her Ortho Surgeon has faxed over soe forms for surgery clearance on 04/04 and 04/23 and would like a response.   Callback for Ortho - [phone]  Pt Callback [phone]

## 2023-11-21 ENCOUNTER — Other Ambulatory Visit: Payer: Self-pay | Admitting: Orthopedic Surgery

## 2023-11-26 ENCOUNTER — Encounter: Payer: Self-pay | Admitting: Orthopedic Surgery

## 2023-12-07 ENCOUNTER — Other Ambulatory Visit: Payer: Self-pay | Admitting: Orthopedic Surgery

## 2023-12-19 ENCOUNTER — Other Ambulatory Visit: Payer: Self-pay | Admitting: Orthopedic Surgery

## 2023-12-25 ENCOUNTER — Telehealth: Payer: Self-pay | Admitting: Orthopedic Surgery

## 2023-12-25 ENCOUNTER — Ambulatory Visit: Payer: BC Managed Care – PPO | Admitting: Family Medicine

## 2023-12-25 ENCOUNTER — Encounter: Payer: Self-pay | Admitting: Family Medicine

## 2023-12-25 VITALS — BP 184/72 | HR 91 | Temp 98.0°F | Ht 66.0 in | Wt 288.0 lb

## 2023-12-25 DIAGNOSIS — Z8673 Personal history of transient ischemic attack (TIA), and cerebral infarction without residual deficits: Secondary | ICD-10-CM

## 2023-12-25 DIAGNOSIS — F319 Bipolar disorder, unspecified: Secondary | ICD-10-CM

## 2023-12-25 DIAGNOSIS — M25511 Pain in right shoulder: Secondary | ICD-10-CM

## 2023-12-25 DIAGNOSIS — G8929 Other chronic pain: Secondary | ICD-10-CM

## 2023-12-25 DIAGNOSIS — Z01818 Encounter for other preprocedural examination: Secondary | ICD-10-CM | POA: Diagnosis not present

## 2023-12-25 DIAGNOSIS — F325 Major depressive disorder, single episode, in full remission: Secondary | ICD-10-CM

## 2023-12-25 DIAGNOSIS — I10 Essential (primary) hypertension: Secondary | ICD-10-CM | POA: Diagnosis not present

## 2023-12-25 DIAGNOSIS — J4520 Mild intermittent asthma, uncomplicated: Secondary | ICD-10-CM

## 2023-12-25 DIAGNOSIS — M12811 Other specific arthropathies, not elsewhere classified, right shoulder: Secondary | ICD-10-CM

## 2023-12-25 DIAGNOSIS — M19011 Primary osteoarthritis, right shoulder: Secondary | ICD-10-CM

## 2023-12-25 LAB — CBC WITH DIFFERENTIAL/PLATELET
Basophils Absolute: 0.1 10*3/uL (ref 0.0–0.2)
Basos: 1 %
EOS (ABSOLUTE): 0.2 10*3/uL (ref 0.0–0.4)
Eos: 3 %
Hematocrit: 45.9 % (ref 34.0–46.6)
Hemoglobin: 14.6 g/dL (ref 11.1–15.9)
Immature Grans (Abs): 0 10*3/uL (ref 0.0–0.1)
Immature Granulocytes: 0 %
Lymphocytes Absolute: 2 10*3/uL (ref 0.7–3.1)
Lymphs: 27 %
MCH: 28.1 pg (ref 26.6–33.0)
MCHC: 31.8 g/dL (ref 31.5–35.7)
MCV: 88 fL (ref 79–97)
Monocytes Absolute: 0.8 10*3/uL (ref 0.1–0.9)
Monocytes: 10 %
Neutrophils Absolute: 4.4 10*3/uL (ref 1.4–7.0)
Neutrophils: 59 %
Platelets: 233 10*3/uL (ref 150–450)
RBC: 5.2 x10E6/uL (ref 3.77–5.28)
RDW: 14.9 % (ref 11.7–15.4)
WBC: 7.4 10*3/uL (ref 3.4–10.8)

## 2023-12-25 LAB — CMP14+EGFR
ALT: 57 IU/L — ABNORMAL HIGH (ref 0–32)
AST: 77 IU/L — ABNORMAL HIGH (ref 0–40)
Albumin: 4.3 g/dL (ref 3.8–4.9)
Alkaline Phosphatase: 99 IU/L (ref 44–121)
BUN/Creatinine Ratio: 13 (ref 9–23)
BUN: 10 mg/dL (ref 6–24)
Bilirubin Total: 0.7 mg/dL (ref 0.0–1.2)
CO2: 21 mmol/L (ref 20–29)
Calcium: 9.6 mg/dL (ref 8.7–10.2)
Chloride: 103 mmol/L (ref 96–106)
Creatinine, Ser: 0.8 mg/dL (ref 0.57–1.00)
Globulin, Total: 2.3 g/dL (ref 1.5–4.5)
Glucose: 93 mg/dL (ref 70–99)
Potassium: 5 mmol/L (ref 3.5–5.2)
Sodium: 140 mmol/L (ref 134–144)
Total Protein: 6.6 g/dL (ref 6.0–8.5)
eGFR: 85 mL/min/{1.73_m2} (ref >59)

## 2023-12-25 LAB — LIPID PANEL
Chol/HDL Ratio: 3 ratio (ref 0.0–4.4)
Cholesterol, Total: 115 mg/dL (ref 100–199)
HDL: 38 mg/dL — ABNORMAL LOW (ref >39)
LDL Chol Calc (NIH): 56 mg/dL (ref 0–99)
Triglycerides: 112 mg/dL (ref 0–149)
VLDL Cholesterol Cal: 21 mg/dL (ref 5–40)

## 2023-12-25 LAB — BAYER DCA HB A1C WAIVED: HB A1C (BAYER DCA - WAIVED): 5.4 % (ref 4.8–5.6)

## 2023-12-25 MED ORDER — LOSARTAN POTASSIUM 50 MG PO TABS: 100.0000 mg | ORAL_TABLET | Freq: Every day | ORAL | Status: DC

## 2023-12-25 NOTE — Patient Instructions (Signed)
 Hold losartan  on day of surgery  Monitor BP at home and send me measurements in one week.   Monitoring your BP at home   Your BP was elevated today in office  Please keep a log of your BP at home.  The best time to take BP is 1st thing in the morning after waking.  Sit for 5 minutes with feet flat on the floor, arm at heart level.  Options for BP cuffs are at [employer], [employer], [facility], [facility] & [facility].  We will review measurements at follow up and determine plan for BP management.  If you have access, you can send a message in MyChart with your measurements prior to your follow up appointment.  The brand I recommend to get is [employer]ron (Bronze).

## 2023-12-25 NOTE — Telephone Encounter (Signed)
 Dr. Ernesta Heading pt - pt lvm stating that she has been cleared for surgery and that a CT needs to be done as soon as possible and would like to make arrangements to schedule surgery [phone]

## 2023-12-25 NOTE — Progress Notes (Signed)
 Subjective:  Patient ID: Belinda Turner, female    DOB: 05/04/1966, 58 y.o.   MRN: [id number]  Patient Care Team: Chrystine Crate, FNP as PCP - General (Family Medicine)   Chief Complaint:  surgical clearance  HPI: Belinda Turner is a 58 y.o. female presenting on 12/25/2023 for surgical clearance For right shoulder arthroplasty    HPI 1) High Risk Cardiac Conditions  1) Recent MI - No.  2) Decompensated Heart Failure - No.  3) Unstable angina - No.  4) Symptomatic arrythmia - No.  5) Sx Valvular Disease - No.  2) Intermediate Risk Factors - DM, CKD, CVA, CHF, CAD - Yes.   History of CVA in 2015.   2) Functional Status - > 4 mets (Walk, run, climb stairs) Yes.  Howie Mackie Activity Status Index:   3) Surgery Specific Risk - High  (Emergency, Vascular, Intra-abdominal, Extensive ops)          Intermediate (Carotid, Head and Neck, Orthopaedic )          Low (Endoscopic, Cataract, Breast )  4) Further Noninvasive evaluation -   1) EKG - Yes.     1) Hx of CVA, CAD, DM, CKD  2) Echo - No.   1) Worsening dyspnea   3) Stress Testing - Active Cardiac Disease - No.  5) Need for medical therapy - Beta Blocker, Statins indicated ? Yes.     6) Primary Hypertension  States that BP at home has been good, she is up today due to pain.   7) Asthma  States that her asthma is well controlled now that the pollen has decreased. She has not used rescue inhaler in over 2 weeks. She is sleeping in bed, on two thin pillows, denies nighttime awakenings.   Relevant past medical, surgical, family, and social history reviewed and updated as indicated.  Allergies and medications reviewed and updated. Data reviewed: Chart in Epic.   Past Medical History:  Diagnosis Date   Acute non-recurrent ethmoidal sinusitis 05/10/2023   Asthma    Bipolar 1 disorder (HCC)    Depression    Hypertension    Migraine    Nasal congestion 05/10/2023   Sore throat 05/10/2023   Stroke (HCC) 2016    2 strokes   Thyroid  disease     Past Surgical History:  Procedure Laterality Date   CESAREAN SECTION  2005   CESAREAN SECTION  2006   CHOLECYSTECTOMY  2014   COLONOSCOPY WITH PROPOFOL  N/A 03/01/2020   Procedure: COLONOSCOPY WITH PROPOFOL ;  Surgeon: Suzette Espy, MD;  Location: [facility];  Service: Endoscopy;  Laterality: N/A;  2:00pm   THYROIDECTOMY  2013   TOTAL ABDOMINAL HYSTERECTOMY  2010    Social History   Socioeconomic History   Marital status: Married    Spouse name: Not on file   Number of children: Not on file   Years of education: Not on file   Highest education level: Some college, no degree  Occupational History   Not on file  Tobacco Use   Smoking status: Former    Current packs/day: 0.00    Types: Cigarettes    Quit date: 84    Years since quitting: 41.4   Smokeless tobacco: Never  Vaping Use   Vaping status: Never Used  Substance and Sexual Activity   Alcohol use: Yes    Comment: occ- 1-2 times a month.    Drug use: Never   Sexual activity: Yes  Birth control/protection: None  Other Topics Concern   Not on file  Social History Narrative   Not on file   Social Drivers of Health   Financial Resource Strain: Low Risk  (12/25/2023)   Overall Financial Resource Strain (CARDIA)    Difficulty of Paying Living Expenses: Not hard at all  Food Insecurity: No Food Insecurity (12/25/2023)   Hunger Vital Sign    Worried About Running Out of Food in the Last Year: Never true    Ran Out of Food in the Last Year: Never true  Transportation Needs: No Transportation Needs (12/25/2023)   PRAPARE - Administrator, Civil Service (Medical): No    Lack of Transportation (Non-Medical): No  Physical Activity: Insufficiently Active (12/25/2023)   Exercise Vital Sign    Days of Exercise per Week: 3 days    Minutes of Exercise per Session: 30 min  Stress: Stress Concern Present (12/25/2023)   [employer] of Occupational Health - Occupational  Stress Questionnaire    Feeling of Stress : Rather much  Social Connections: Unknown (12/25/2023)   Social Connection and Isolation Panel [NHANES]    Frequency of Communication with Friends and Family: More than three times a week    Frequency of Social Gatherings with Friends and Family: Once a week    Attends Religious Services: Patient declined    Database administrator or Organizations: No    Attends Engineer, structural: Not on file    Marital Status: Married  Catering manager Violence: Not At Risk (12/25/2023)   Humiliation, Afraid, Rape, and Kick questionnaire    Fear of Current or Ex-Partner: No    Emotionally Abused: No    Physically Abused: No    Sexually Abused: No    Outpatient Encounter Medications as of 12/25/2023  Medication Sig   albuterol  (VENTOLIN  HFA) 108 (90 Base) MCG/ACT inhaler TAKE 2 PUFFS BY MOUTH EVERY 6 HOURS AS NEEDED FOR WHEEZE OR SHORTNESS OF BREATH   Albuterol -Budesonide  (AIRSUPRA ) 90-80 MCG/ACT AERO Inhale 2 puffs into the lungs every 4 (four) hours as needed.   budesonide -formoterol  (SYMBICORT ) 80-4.5 MCG/ACT inhaler Take 2 puffs first thing in am and then another 2 puffs about 12 hours later.   Chromium-Cinnamon (CINNAMON PLUS CHROMIUM) [id number] MCG-MG CAPS Take 1,000 mcg by mouth daily.   fluticasone  (FLONASE ) 50 MCG/ACT nasal spray SPRAY 2 SPRAYS INTO EACH NOSTRIL EVERY DAY   levothyroxine  (SYNTHROID ) 112 MCG tablet TAKE 1 TABLET BY MOUTH EVERY DAY BEFORE BREAKFAST   losartan  (COZAAR ) 50 MG tablet TAKE 1 TABLET BY MOUTH EVERY DAY   magnesium oxide (MAG-OX) 400 MG tablet Take 400 mg by mouth daily.   meloxicam (MOBIC) 15 MG tablet Take 15 mg by mouth daily.   Multiple Vitamin (MULTIVITAMIN) tablet Take 1 tablet by mouth daily.   rosuvastatin  (CRESTOR ) 5 MG tablet Take 1 tablet (5 mg total) by mouth daily.   traMADol  (ULTRAM ) 50 MG tablet TAKE 1 TABLET BY MOUTH EVERY 6 HOURS AS NEEDED   traZODone  (DESYREL ) 100 MG tablet TAKE 1 TABLET BY MOUTH  EVERYDAY AT BEDTIME   No facility-administered encounter medications on file as of 12/25/2023.    Allergies  Allergen Reactions   Molds & Smuts Cough, Hives, Palpitations, Shortness Of Breath and Swelling   Cortisone Nausea And Vomiting and Swelling   Elemental Sulfur Nausea And Vomiting and Swelling   Lisinopril  Cough   Vraylar  [Cariprazine ] Other (See Comments)    EPS, parkinsonism like symptoms per patient  Review of Systems As per HPI  Objective:  BP (!) 155/77   Pulse 91   Temp 98 F (36.7 C)   Ht 5\' 6"  (1.676 m)   Wt 288 lb (130.6 kg)   SpO2 96%   BMI 46.48 kg/m    Wt Readings from Last 3 Encounters:  12/25/23 288 lb (130.6 kg)  10/26/23 293 lb (132.9 kg)  08/22/23 288 lb (130.6 kg)   Physical Exam Constitutional:      General: She is awake. She is not in acute distress.    Appearance: Normal appearance. She is well-developed and well-groomed. She is obese. She is not ill-appearing, toxic-appearing or diaphoretic.  Cardiovascular:     Rate and Rhythm: Normal rate and regular rhythm.     Pulses: Normal pulses.          Radial pulses are 2+ on the right side and 2+ on the left side.       Posterior tibial pulses are 2+ on the right side and 2+ on the left side.     Heart sounds: Normal heart sounds. No murmur heard.    No gallop.  Pulmonary:     Effort: Pulmonary effort is normal. No respiratory distress.     Breath sounds: Normal breath sounds. No stridor. No wheezing, rhonchi or rales.  Musculoskeletal:     Right shoulder: Swelling, effusion, tenderness and bony tenderness present. No deformity, laceration or crepitus. Decreased range of motion. Normal strength. Normal pulse.     Cervical back: Full passive range of motion without pain and neck supple.     Right lower leg: No edema.     Left lower leg: No edema.  Skin:    General: Skin is warm.     Capillary Refill: Capillary refill takes less than 2 seconds.  Neurological:     General: No focal deficit  present.     Mental Status: She is alert, oriented to person, place, and time and easily aroused. Mental status is at baseline.     GCS: GCS eye subscore is 4. GCS verbal subscore is 5. GCS motor subscore is 6.     Motor: No weakness.  Psychiatric:        Attention and Perception: Attention and perception normal.        Mood and Affect: Mood and affect normal.        Speech: Speech normal.        Behavior: Behavior normal. Behavior is cooperative.        Thought Content: Thought content normal. Thought content does not include homicidal or suicidal ideation. Thought content does not include homicidal or suicidal plan.        Cognition and Memory: Cognition and memory normal.        Judgment: Judgment normal.     Results for orders placed or performed in visit on 06/26/23  Bayer DCA Hb A1c Waived   Collection Time: 06/26/23  9:41 AM  Result Value Ref Range   HB A1C (BAYER DCA - WAIVED) 5.6 4.8 - 5.6 %  CMP14+EGFR   Collection Time: 06/26/23  9:43 AM  Result Value Ref Range   Glucose 73 70 - 99 mg/dL   BUN 18 6 - 24 mg/dL   Creatinine, Ser 5.40 0.57 - 1.00 mg/dL   eGFR 75 >98 JX/BJY/7.82   BUN/Creatinine Ratio 20 9 - 23   Sodium 141 134 - 144 mmol/L   Potassium 4.3 3.5 - 5.2 mmol/L   Chloride 104 96 -  106 mmol/L   CO2 24 20 - 29 mmol/L   Calcium  9.2 8.7 - 10.2 mg/dL   Total Protein 6.5 6.0 - 8.5 g/dL   Albumin 4.2 3.8 - 4.9 g/dL   Globulin, Total 2.3 1.5 - 4.5 g/dL   Bilirubin Total 0.3 0.0 - 1.2 mg/dL   Alkaline Phosphatase 74 44 - 121 IU/L   AST 26 0 - 40 IU/L   ALT 28 0 - 32 IU/L  CBC with Differential/Platelet   Collection Time: 06/26/23  9:43 AM  Result Value Ref Range   WBC 8.7 3.4 - 10.8 x10E3/uL   RBC 4.88 3.77 - 5.28 x10E6/uL   Hemoglobin 13.4 11.1 - 15.9 g/dL   Hematocrit 16.1 09.6 - 46.6 %   MCV 87 79 - 97 fL   MCH 27.5 26.6 - 33.0 pg   MCHC 31.6 31.5 - 35.7 g/dL   RDW 04.5 40.9 - 81.1 %   Platelets 255 150 - 450 x10E3/uL   Neutrophils 57 Not Estab. %    Lymphs 27 Not Estab. %   Monocytes 12 Not Estab. %   Eos 3 Not Estab. %   Basos 1 Not Estab. %   Neutrophils Absolute 5.0 1.4 - 7.0 x10E3/uL   Lymphocytes Absolute 2.4 0.7 - 3.1 x10E3/uL   Monocytes Absolute 1.0 (H) 0.1 - 0.9 x10E3/uL   EOS (ABSOLUTE) 0.2 0.0 - 0.4 x10E3/uL   Basophils Absolute 0.1 0.0 - 0.2 x10E3/uL   Immature Granulocytes 0 Not Estab. %   Immature Grans (Abs) 0.0 0.0 - 0.1 x10E3/uL  Lipid panel   Collection Time: 06/26/23  9:43 AM  Result Value Ref Range   Cholesterol, Total 117 100 - 199 mg/dL   Triglycerides 914 (H) 0 - 149 mg/dL   HDL 44 >78 mg/dL   VLDL Cholesterol Cal 26 5 - 40 mg/dL   LDL Chol Calc (NIH) 47 0 - 99 mg/dL   Chol/HDL Ratio 2.7 0.0 - 4.4 ratio  TSH   Collection Time: 06/26/23  9:43 AM  Result Value Ref Range   TSH 1.650 0.450 - 4.500 uIU/mL  T3, Free   Collection Time: 06/26/23  9:43 AM  Result Value Ref Range   T3, Free 3.1 2.0 - 4.4 pg/mL  VITAMIN D  25 Hydroxy (Vit-D Deficiency, Fractures)   Collection Time: 06/26/23  9:43 AM  Result Value Ref Range   Vit D, 25-Hydroxy 38.9 30.0 - 100.0 ng/mL       12/25/2023    8:35 AM 06/26/2023    8:58 AM 05/10/2023   10:49 AM 12/27/2022   11:06 AM 04/18/2022    1:39 PM  Depression screen PHQ 2/9  Decreased Interest 0 0 0 2 0  Down, Depressed, Hopeless 0 0 0 1 0  PHQ - 2 Score 0 0 0 3 0  Altered sleeping 1 0 1 2 1   Tired, decreased energy 0 0 1 1 0  Change in appetite 0 0 0 0 0  Feeling bad or failure about yourself  0 0 0 0 0  Trouble concentrating 0 0 0 0 0  Moving slowly or fidgety/restless 0 0 0 0 0  Suicidal thoughts 0 0 0 0 0  PHQ-9 Score 1 0 2 6 1   Difficult doing work/chores Not difficult at all Not difficult at all Not difficult at all Somewhat difficult Not difficult at all       12/25/2023    8:35 AM 06/26/2023    8:59 AM 05/10/2023   10:49 AM  12/27/2022   11:07 AM  GAD 7 : Generalized Anxiety Score  Nervous, Anxious, on Edge 1 0 0 1  Control/stop worrying 0 0 0 0  Worry too  much - different things 0 0 0 1  Trouble relaxing 1 0 1 1  Restless 0 0 0 0  Easily annoyed or irritable 0 0 0 0  Afraid - awful might happen 0 0 0 0  Total GAD 7 Score 2 0 1 3  Anxiety Difficulty Not difficult at all Not difficult at all Not difficult at all Somewhat difficult    Duke Activity Status Index (DASI) from [email or web address]  on 12/25/2023 ** All calculations should be rechecked by clinician prior to use **  RESULT SUMMARY: 58.2 points The higher the score (maximum 58.2), the higher the functional status.  9.89 METs  INPUTS: Take care of self --> 2.75 = Yes Walk indoors --> 1.75 = Yes Walk 1&ndash;2 blocks on level ground --> 2.75 = Yes Climb a flight of stairs or walk up a hill --> 5.5 = Yes Run a short distance --> 8 = Yes Do light work around the house --> 2.7 = Yes Do moderate work around the house --> 3.5 = Yes Do heavy work around the house --> 8 = Yes Do yardwork --> 4.5 = Yes Have sexual relations --> 5.25 = Yes Participate in moderate recreational activities --> 6 = Yes Participate in strenuous sports --> 7.5 = Yes  Pertinent labs & imaging results that were available during my care of the patient were reviewed by me and considered in my medical decision making.  Assessment & Plan:  Timberly was seen today for surgical clearance.  Diagnoses and all orders for this visit:  1. Preop examination (Primary) I have independently evaluated patient.  She is moderate risk for a intermediate risk surgery given history of CVA, obesity, asthma, primary HTN. There are modifiable risk factors such as obesity. RCRI/NSQIP calculation for MACE is: Revised Cardiac Risk Index for Pre-Operative Risk from [email or web address]  on 12/25/2023 ** All calculations should be rechecked by clinician prior to use **  RESULT SUMMARY: 1 points RCRI Score  6.0 % Risk of major cardiac event   INPUTS: High-risk surgery --> 0 = No History of ischemic heart disease --> 0 = No History of congestive  heart failure --> 0 = No History of cerebrovascular disease --> 1 = Yes Pre-operative treatment with insulin --> 0 = No Pre-operative creatinine >2 mg/dL / 829.5 mol/L --> 0 = No  DASI reviewed above.   Not indicated for CXR (if asx/healthy/no resp issues don't need) EKG obtained and reviewed. No acute abnormalities seen.  Not indicated PFTs (significant cardiopulm hx? OSA/OHS) Not indicated Echo (get if CHF and has not had in >1 year OR if worse HF symptoms) Will hold ARB on day of surgery. Not taking BB. Okay to hold crestor .   - Bayer DCA Hb A1c Waived  2. Major depressive disorder with single episode, in full remission (HCC) Well controlled. Continue current regimen.  Denies SI.  - CBC with Differential/Platelet - CMP14+EGFR  3. Bipolar 1 disorder (HCC) As above.   4. Morbid obesity (HCC) Discussed with patient to continue healthy lifestyle choices, including diet (rich in fruits, vegetables, and lean proteins, and low in salt and simple carbohydrates) and exercise (at least 30 minutes of moderate physical activity daily). Limit beverages high is sugar. Recommended at least 80-100 oz of water daily.  Labs as below. Will communicate results to  patient once available. Will await results to determine next steps.  - Bayer DCA Hb A1c Waived - EKG 12-Lead  5. History of stroke Labs as below. Will communicate results to patient once available. Will await results to determine next steps.  Fasting  - Lipid panel - EKG 12-Lead  6. Primary hypertension Not at goal in office today. Patient reports well controlled at home. Will review home measurements and determine next steps. Will increase to 100 mg of losartan .  - EKG 12-Lead - losartan  (COZAAR ) 50 MG tablet; Take 2 tablets (100 mg total) by mouth daily.  7. Acute pain of right shoulder Plan for surgery with Dr. Ernesta Heading.   8. Mild intermittent asthma without complication Well controlled. Continue current regimen.   Continue  all other maintenance medications.  Follow up plan: Return in about 3 months (around 03/26/2024) for Chronic Condition Follow up.  Continue healthy lifestyle choices, including diet (rich in fruits, vegetables, and lean proteins, and low in salt and simple carbohydrates) and exercise (at least 30 minutes of moderate physical activity daily).  Written and verbal instructions provided   The above assessment and management plan was discussed with the patient. The patient verbalized understanding of and has agreed to the management plan. Patient is aware to call the clinic if they develop any new symptoms or if symptoms persist or worsen. Patient is aware when to return to the clinic for a follow-up visit. Patient educated on when it is appropriate to go to the emergency department.   Jacqualyn Mates, DNP-FNP [facility] [address] [place], [place] [address] [phone]

## 2023-12-26 ENCOUNTER — Ambulatory Visit: Payer: Self-pay | Admitting: Family Medicine

## 2023-12-26 NOTE — Telephone Encounter (Signed)
 CT auth started by phone awaiting approval. MyChart message sent to pt with update.

## 2023-12-26 NOTE — Progress Notes (Signed)
 Slightly elevated liver enzymes, please avoid tylenol and alcohol. Will repeat in 4-6 weeks. Cholesterol levels look good. Continue crestor . Recommend increasing healthy fats such as salmon, nuts, avocado. All other labs look good.

## 2023-12-29 ENCOUNTER — Telehealth: Payer: Self-pay | Admitting: Orthopedic Surgery

## 2023-12-29 NOTE — Telephone Encounter (Signed)
 Dr. Ernesta Heading pt Tawny Fate nurse reviewer w/[employer] [phone] - lvm on 12/28/23 3:00pm stating she was calling about a precert request, MRI of the right shoulder.  Stated she had one done in 08/29/2023.  Needs to clarify if this is for preop planning.  Needs to know if we are planning to do a shoulder replacement, otherwise they would need to know why they are requesting another one, precert #[id number]

## 2023-12-30 ENCOUNTER — Other Ambulatory Visit: Payer: Self-pay | Admitting: Orthopedic Surgery

## 2024-01-01 ENCOUNTER — Other Ambulatory Visit: Payer: Self-pay | Admitting: Family Medicine

## 2024-01-01 DIAGNOSIS — F325 Major depressive disorder, single episode, in full remission: Secondary | ICD-10-CM

## 2024-01-02 ENCOUNTER — Telehealth: Payer: Self-pay | Admitting: Orthopedic Surgery

## 2024-01-02 NOTE — Telephone Encounter (Signed)
 MyChart message sent to pt regarding next steps.

## 2024-01-02 NOTE — Telephone Encounter (Signed)
 Dr. Ernesta Heading pt - pt lvm stating that she needs someone to call her, she wants help trying to get approval for a CT scan.  She stated that she spoke w/her insurance and they stated that they faxed over a request for additional information on 12/28/23.  She stated that we could call her insurance company at [phone] option 1 or call her at [phone].  She wants help w/this ASAP and would appreciate a call.

## 2024-01-04 ENCOUNTER — Telehealth: Payer: Self-pay | Admitting: Orthopedic Surgery

## 2024-01-04 NOTE — Telephone Encounter (Signed)
 Dr. Wyatt Heap pt - spoke w/the pt, she came back to complete the release form and pay the $20 for her FMLA, but she is stating that Tammy in the [place] office needs the surgery date for the forms.  She stated she is scheduled for her CT on 01/10/24.  [phone]

## 2024-01-06 ENCOUNTER — Other Ambulatory Visit: Payer: Self-pay | Admitting: Family Medicine

## 2024-01-06 DIAGNOSIS — I10 Essential (primary) hypertension: Secondary | ICD-10-CM

## 2024-01-07 NOTE — Telephone Encounter (Signed)
 I called and they stated someone had already called back and there is a auth # [id number] valid 12/26/23-03/25/24

## 2024-01-07 NOTE — Telephone Encounter (Signed)
 Name from pharmacy: LOSARTAN  POTASSIUM 50 MG TAB  To pharmacy: DX Code Needed  PATIENT STATES THAT DOSE WENT UP TO 100 MG.  OV 12/25/23 Please advise and send in new script if appropriate

## 2024-01-08 NOTE — Telephone Encounter (Signed)
 Waiting on pt to have her CT scan completed to schedule.

## 2024-01-10 ENCOUNTER — Ambulatory Visit: Payer: Self-pay | Admitting: Orthopedic Surgery

## 2024-01-10 ENCOUNTER — Ambulatory Visit (HOSPITAL_COMMUNITY)
Admission: RE | Admit: 2024-01-10 | Discharge: 2024-01-10 | Disposition: A | Discharge status: Home / Self Care | Source: Ambulatory Visit | Attending: Orthopedic Surgery | Admitting: Orthopedic Surgery

## 2024-01-10 DIAGNOSIS — M25511 Pain in right shoulder: Secondary | ICD-10-CM | POA: Diagnosis not present

## 2024-01-10 DIAGNOSIS — G8929 Other chronic pain: Secondary | ICD-10-CM | POA: Insufficient documentation

## 2024-01-10 DIAGNOSIS — M12811 Other specific arthropathies, not elsewhere classified, right shoulder: Secondary | ICD-10-CM | POA: Diagnosis not present

## 2024-01-10 DIAGNOSIS — M19011 Primary osteoarthritis, right shoulder: Secondary | ICD-10-CM | POA: Insufficient documentation

## 2024-01-10 DIAGNOSIS — Z01818 Encounter for other preprocedural examination: Secondary | ICD-10-CM

## 2024-01-11 NOTE — Patient Instructions (Signed)
 Belinda Turner  01/11/2024     @PREFPERIOPPHARMACY @   Your procedure is scheduled on  01/21/2024.   Report to [facility] at  0600 A.M.   Call this number if you have problems the morning of surgery:  [phone]  If you experience any cold or flu symptoms such as cough, fever, chills, shortness of breath, etc. between now and your scheduled surgery, please notify us  at the above number.   Remember:         Use your inhalers before you come and bring your rescue inhaler with you.   Do not eat after midnight.    You may drink clear liquids until  0330 am on 01/21/2024.    Clear liquids allowed are:                    Water, Juice (No red color; non-citric and without pulp; diabetics please choose diet or no sugar options), Carbonated beverages (diabetics please choose diet or no sugar options), Clear Tea (No creamer, milk, or cream, including half & half and powdered creamer), Black Coffee Only (No creamer, milk or cream, including half & half and powdered creamer), and Clear Sports drink (No red color; diabetics please choose diet or no sugar options)          At 0330 am on 01/21/2024 drink your carb drink. YOu can have nothing else after this.   Take these medicines the morning of surgery with A SIP OF WATER                  levothyroxine , meloxicam, tramadol (if needed).    Do not wear jewelry, make-up or nail polish, including gel polish,  artificial nails, or any other type of covering on natural nails (fingers and  toes).  Do not wear lotions, powders, or perfumes, or deodorant.  Do not shave 48 hours prior to surgery.  Men may shave face and neck.  Do not bring valuables to the hospital.  [facility] is not responsible for any belongings or valuables.  Contacts, dentures or bridgework may not be worn into surgery.  Leave your suitcase in the car.  After surgery it may be brought to your room.  For patients admitted to the hospital, discharge time will be  determined by your treatment team.  Patients discharged the day of surgery will not be allowed to drive home and must have someone with them for 24 hours.    Special instructions:  DO NOT smoke tobacco or vape for 24 hors before your procedure.  Please read over the following fact sheets that you were given. Pain Booklet, Coughing and Deep Breathing, Surgical Site Infection Prevention, Anesthesia Post-op Instructions, and Care and Recovery After Surgery        Total Shoulder Replacement Surgery: What to Know After After total shoulder replacement surgery, it's common to have a bruised and stiff shoulder and arm. You may also have shoulder and arm pain. Follow these instructions at home: Medicines Take your medicines only as told. If you were given antibiotics, take them as told. Do not stop taking them even if you start to feel better. You may need to take steps to help treat or prevent trouble pooping (constipation), such as: Taking medicines to help you poop. Eating foods high in fiber, like beans, whole grains, and fresh fruits and vegetables. Drinking more fluids as told. Ask your health care provider if it's safe to drive or use  machines while taking your medicine. If you have a sling or immobilizer:  Wear the sling or immobilizer as told. Take it off only if your provider says you can. Check the skin around it every day. Tell your provider if you see problems. Loosen the sling or immobilizer if your fingers tingle, are numb, or turn cold and blue. Keep the sling or immobilizer clean and dry. Bathing Do not take baths, swim, or use a hot tub until you're told it's OK. Ask if you can shower. If your sling or immobilizer isn't waterproof: Do not let it get wet. Cover it when you take a bath or a shower. Use a cover that doesn't let any water in. Keep your bandage dry until your provider says it can be taken off.. Incision care  Take care of your cut from surgery, also called  an incision, as told. Make sure you: Wash your hands with soap and water for at least 20 seconds before and after you change your bandage. If you can't use soap and water, use hand sanitizer. Change your bandage. Leave stitches or skin glue alone. Leave tape strips alone unless you're told to take them off. You may trim the edges of the tape strips if they curl up. If you have a tube, called a drain, to remove extra fluid, follow instructions on how to care for it. Check the area around your cut every day for signs of infection. Check for: More redness, swelling, or pain. More fluid or blood. Warmth. Pus or a bad smell. Managing pain, stiffness, and swelling  Use ice or an ice pack as told. If you have a sling or immobilizer that you can take off, remove it only as told. Place a towel between your skin and the ice. Leave the ice on for 20 minutes, 2-3 times a day. If your skin turns red, take off the ice right away to prevent skin damage. The risk of damage is higher if you can't feel pain, heat, or cold. Move your fingers and elbow often to reduce stiffness and swelling. If you have an icing device, use it as told. Activity Rest as told. Get up to take short walks at least every 2 hours during the day. This helps you breathe better and keeps your blood flowing. Ask for help if you feel weak or unsteady. Do not use your arm to push yourself up in bed or from a chair. Do not lift anything heavier than a cup of coffee for the first 6 weeks with the arm you had surgery on, or as told by your provider. Ask if it's OK for you to lift. Exercise as told. This includes doing physical therapy. Try not to overuse your shoulder. Early overuse of the shoulder may result in later problems. Do not play contact sports. Ask what things are safe for you to do at home. Ask when you can go back to work or school. General instructions Do not have dental work or cleanings for at least 3 months. Ask your  provider if you need to take antibiotics before you have dental work or have your teeth cleaned. Tell your dentist about your new joint. Wear compression stockings to reduce swelling and prevent blood clots in your legs. Ask when it's safe to drive if you have sling or immobilizer on your arm. Do not smoke, vape, or use nicotine or tobacco. Keep all follow-up visits. Your provider will make sure you're healing. Contact a health care provider if: You  develop a rash. You have a fever. You have any signs of infection. The edges of your cut from surgery break open after stitches have been removed. Get help right away if: You have redness, swelling, pain, or warmth in your leg or arm. You have chest pain. You have shortness of breath. These symptoms may be an emergency. Call 911 right away. Do not wait to see if the symptoms will go away. Do not drive yourself to the hospital. This information is not intended to replace advice given to you by your health care provider. Make sure you discuss any questions you have with your health care provider. Document Revised: 04/07/2023 Document Reviewed: 04/07/2023 Elsevier Patient Education  2024 [employer] General Anesthesia, Adult, Care After The following information offers guidance on how to care for yourself after your procedure. Your health care provider may also give you more specific instructions. If you have problems or questions, contact your health care provider. What can I expect after the procedure? After the procedure, it is common for people to: Have pain or discomfort at the IV site. Have nausea or vomiting. Have a sore throat or hoarseness. Have trouble concentrating. Feel cold or chills. Feel weak, sleepy, or tired (fatigue). Have soreness and body aches. These can affect parts of the body that were not involved in surgery. Follow these instructions at home: For the time period you were told by your health care  provider:  Rest. Do not participate in activities where you could fall or become injured. Do not drive or use machinery. Do not drink alcohol. Do not take sleeping pills or medicines that cause drowsiness. Do not make important decisions or sign legal documents. Do not take care of children on your own. General instructions Drink enough fluid to keep your urine pale yellow. If you have sleep apnea, surgery and certain medicines can increase your risk for breathing problems. Follow instructions from your health care provider about wearing your sleep device: Anytime you are sleeping, including during daytime naps. While taking prescription pain medicines, sleeping medicines, or medicines that make you drowsy. Return to your normal activities as told by your health care provider. Ask your health care provider what activities are safe for you. Take over-the-counter and prescription medicines only as told by your health care provider. Do not use any products that contain nicotine or tobacco. These products include cigarettes, chewing tobacco, and vaping devices, such as e-cigarettes. These can delay incision healing after surgery. If you need help quitting, ask your health care provider. Contact a health care provider if: You have nausea or vomiting that does not get better with medicine. You vomit every time you eat or drink. You have pain that does not get better with medicine. You cannot urinate or have bloody urine. You develop a skin rash. You have a fever. Get help right away if: You have trouble breathing. You have chest pain. You vomit blood. These symptoms may be an emergency. Get help right away. Call 911. Do not wait to see if the symptoms will go away. Do not drive yourself to the hospital. Summary After the procedure, it is common to have a sore throat, hoarseness, nausea, vomiting, or to feel weak, sleepy, or fatigue. For the time period you were told by your health care  provider, do not drive or use machinery. Get help right away if you have difficulty breathing, have chest pain, or vomit blood. These symptoms may be an emergency. This information is not intended to  replace advice given to you by your health care provider. Make sure you discuss any questions you have with your health care provider. Document Revised: 10/07/2021 Document Reviewed: 10/07/2021 Elsevier Patient Education  2024 [employer] Inc. How to Use Chlorhexidine  at Home in the Shower Chlorhexidine  gluconate (CHG) is a germ-killing (antiseptic) wash that's used to clean the skin. It can get rid of the germs that normally live on the skin and can keep them away for about 24 hours. If you're having surgery, you may be told to shower with CHG at home the night before surgery. This can help lower your risk for infection. To use CHG wash in the shower, follow the steps below. Supplies needed: CHG body wash. Clean washcloth. Clean towel. How to use CHG in the shower Follow these steps unless you're told to use CHG in a different way: Start the shower. Use your normal soap and shampoo to wash your face and hair. Turn off the shower or move out of the shower stream. Pour CHG onto a clean washcloth. Do not use any type of brush or rough sponge. Start at your neck, washing your body down to your toes. Make sure you: Wash the part of your body where the surgery will be done for at least 1 minute. Do not scrub. Do not use CHG on your head or face unless your health care provider tells you to. If it gets into your ears or eyes, rinse them well with water. Do not wash your genitals with CHG. Wash your back and under your arms. Make sure to wash skin folds. Let the CHG sit on your skin for 1-2 minutes or as long as told. Rinse your entire body in the shower, including all body creases and folds. Turn off the shower. Dry off with a clean towel. Do not put anything on your skin afterward, such as powder,  lotion, or perfume. Put on clean clothes or pajamas. If it's the night before surgery, sleep in clean sheets. General tips Use CHG only as told, and follow the instructions on the label. Use the full amount of CHG as told. This is often one bottle. Do not smoke and stay away from flames after using CHG. Your skin may feel sticky after using CHG. This is normal. The sticky feeling will go away as the CHG dries. Do not use CHG: If you have a chlorhexidine  allergy or have reacted to chlorhexidine  in the past. On open wounds or areas of skin that have broken skin, cuts, or scrapes. On babies younger than 67 months of age. Contact a health care provider if: You have questions about using CHG. Your skin gets irritated or itchy. You have a rash after using CHG. You swallow any CHG. Call your local poison control center [phone] in the [place] Your eyes itch badly, or they become very red or swollen. Your hearing changes. You have trouble seeing. If you can't reach your provider, go to an urgent care or emergency room. Do not drive yourself. Get help right away if: You have swelling or tingling in your mouth or throat. You make high-pitched whistling sounds when you breathe, most often when you breathe out (wheeze). You have trouble breathing. These symptoms may be an emergency. Call 911 right away. Do not wait to see if the symptoms will go away. Do not drive yourself to the hospital. This information is not intended to replace advice given to you by your health care provider. Make sure you discuss any questions  you have with your health care provider. Document Revised: 01/23/2023 Document Reviewed: 01/19/2022 Elsevier Patient Education  2024 Else[employer] Inc.How to Use an Incentive Spirometer An incentive spirometer is a tool that measures how well you are filling your lungs with each breath. Learning to take long, deep breaths using this tool can help you keep your lungs clear and active.  This may help to reverse or lessen your chance of developing breathing (pulmonary) problems, especially infection. You may be asked to use a spirometer: After a surgery. If you have a lung problem or a history of smoking. After a long period of time when you have been unable to move or be active. If the spirometer includes an indicator to show the highest number that you have reached, your health care provider or respiratory therapist will help you set a goal. Keep a log of your progress as told by your health care provider. What are the risks? Breathing too quickly may cause dizziness or cause you to pass out. Take your time so you do not get dizzy or light-headed. If you are in pain, you may need to take pain medicine before doing incentive spirometry. It is harder to take a deep breath if you are having pain. How to use your incentive spirometer  Sit up on the edge of your bed or on a chair. Hold the incentive spirometer so that it is in an upright position. Before you use the spirometer, breathe out normally. Place the mouthpiece in your mouth. Make sure your lips are closed tightly around it. Breathe in slowly and as deeply as you can through your mouth, causing the piston or the ball to rise toward the top of the chamber. Hold your breath for 3-5 seconds, or for as long as possible. If the spirometer includes a coach indicator, use this to guide you in breathing. Slow down your breathing if the indicator goes above the marked areas. Remove the mouthpiece from your mouth and breathe out normally. The piston or ball will return to the bottom of the chamber. Rest for a few seconds, then repeat the steps 10 or more times. Take your time and take a few normal breaths between deep breaths so that you do not get dizzy or light-headed. Do this every 1-2 hours when you are awake. If the spirometer includes a goal marker to show the highest number you have reached (best effort), use this as a goal  to work toward during each repetition. After each set of 10 deep breaths, cough a few times. This will help to make sure that your lungs are clear. If you have an incision on your chest or abdomen from surgery, place a pillow or a rolled-up towel firmly against the incision when you cough. This can help to reduce pain while taking deep breaths and coughing. General tips When you are able to get out of bed: Walk around often. Continue to take deep breaths and cough in order to clear your lungs. Keep using the incentive spirometer until your health care provider says it is okay to stop using it. If you have been in the hospital, you may be told to keep using the spirometer at home. Contact a health care provider if: You are having difficulty using the spirometer. You have trouble using the spirometer as often as instructed. Your pain medicine is not giving enough relief for you to use the spirometer as told. You have a fever. Get help right away if: You develop shortness of  breath. You develop a cough with bloody mucus from the lungs. You have fluid or blood coming from an incision site after you cough. Summary An incentive spirometer is a tool that can help you learn to take long, deep breaths to keep your lungs clear and active. You may be asked to use a spirometer after a surgery, if you have a lung problem or a history of smoking, or if you have been inactive for a long period of time. Use your incentive spirometer as instructed every 1-2 hours while you are awake. If you have an incision on your chest or abdomen, place a pillow or a rolled-up towel firmly against your incision when you cough. This will help to reduce pain. Get help right away if you have shortness of breath, you cough up bloody mucus, or blood comes from your incision when you cough. This information is not intended to replace advice given to you by your health care provider. Make sure you discuss any questions you have  with your health care provider. Document Revised: 05/18/2023 Document Reviewed: 05/18/2023 Elsevier Patient Education  2024 [employer].

## 2024-01-13 ENCOUNTER — Other Ambulatory Visit: Payer: Self-pay | Admitting: Orthopedic Surgery

## 2024-01-14 ENCOUNTER — Encounter (HOSPITAL_COMMUNITY): Payer: Self-pay

## 2024-01-14 ENCOUNTER — Telehealth: Payer: Self-pay

## 2024-01-14 ENCOUNTER — Encounter (HOSPITAL_COMMUNITY)
Admission: RE | Admit: 2024-01-14 | Discharge: 2024-01-14 | Disposition: A | Discharge status: Home / Self Care | Source: Ambulatory Visit | Attending: Orthopedic Surgery | Admitting: Orthopedic Surgery

## 2024-01-14 VITALS — BP 155/80 | HR 92 | Resp 118 | Ht 66.0 in | Wt 288.0 lb

## 2024-01-14 DIAGNOSIS — Z01818 Encounter for other preprocedural examination: Secondary | ICD-10-CM

## 2024-01-14 DIAGNOSIS — Z01812 Encounter for preprocedural laboratory examination: Secondary | ICD-10-CM | POA: Insufficient documentation

## 2024-01-14 LAB — SURGICAL PCR SCREEN
MRSA, PCR: NEGATIVE
Staphylococcus aureus: NEGATIVE

## 2024-01-14 NOTE — Telephone Encounter (Signed)
 Patient stopped by asking for a return call ASAP in regards to her out of work form. Her surgery is Monday 01/21/24  Please call her at [phone]

## 2024-01-15 NOTE — Telephone Encounter (Signed)
 Called pt and answered questions regarding out of work paperwork. All questions answered and pt verbalized understanding. No further questions at this time.

## 2024-01-21 ENCOUNTER — Encounter (HOSPITAL_COMMUNITY): Payer: Self-pay | Admitting: Orthopedic Surgery

## 2024-01-21 ENCOUNTER — Observation Stay (HOSPITAL_COMMUNITY)

## 2024-01-21 ENCOUNTER — Ambulatory Visit (HOSPITAL_COMMUNITY): Admitting: Certified Registered Nurse Anesthetist

## 2024-01-21 ENCOUNTER — Observation Stay (HOSPITAL_COMMUNITY)
Admission: RE | Admit: 2024-01-21 | Discharge: 2024-01-22 | Disposition: A | Discharge status: Home / Self Care | Attending: Orthopedic Surgery | Admitting: Orthopedic Surgery

## 2024-01-21 ENCOUNTER — Encounter (HOSPITAL_COMMUNITY)
Admission: RE | Disposition: A | Discharge status: Home / Self Care | Payer: Self-pay | Source: Home / Self Care | Attending: Orthopedic Surgery

## 2024-01-21 DIAGNOSIS — Z471 Aftercare following joint replacement surgery: Secondary | ICD-10-CM | POA: Diagnosis not present

## 2024-01-21 DIAGNOSIS — Z87891 Personal history of nicotine dependence: Secondary | ICD-10-CM | POA: Insufficient documentation

## 2024-01-21 DIAGNOSIS — M19011 Primary osteoarthritis, right shoulder: Principal | ICD-10-CM | POA: Diagnosis present

## 2024-01-21 DIAGNOSIS — J45909 Unspecified asthma, uncomplicated: Secondary | ICD-10-CM | POA: Diagnosis not present

## 2024-01-21 DIAGNOSIS — Z01818 Encounter for other preprocedural examination: Principal | ICD-10-CM

## 2024-01-21 DIAGNOSIS — Z8673 Personal history of transient ischemic attack (TIA), and cerebral infarction without residual deficits: Secondary | ICD-10-CM | POA: Diagnosis not present

## 2024-01-21 DIAGNOSIS — I10 Essential (primary) hypertension: Secondary | ICD-10-CM | POA: Diagnosis not present

## 2024-01-21 DIAGNOSIS — E890 Postprocedural hypothyroidism: Secondary | ICD-10-CM | POA: Diagnosis not present

## 2024-01-21 DIAGNOSIS — Z96611 Presence of right artificial shoulder joint: Secondary | ICD-10-CM | POA: Diagnosis not present

## 2024-01-21 DIAGNOSIS — G8918 Other acute postprocedural pain: Secondary | ICD-10-CM | POA: Diagnosis not present

## 2024-01-21 HISTORY — PX: TOTAL SHOULDER ARTHROPLASTY: SHX126

## 2024-01-21 LAB — HEMOGLOBIN AND HEMATOCRIT, BLOOD
HCT: 46 % (ref 36.0–46.0)
Hemoglobin: 13.8 g/dL (ref 12.0–15.0)

## 2024-01-21 SURGERY — ARTHROPLASTY, SHOULDER, TOTAL
Anesthesia: Regional | Site: Shoulder | Laterality: Right

## 2024-01-21 MED ORDER — LIDOCAINE HCL (PF) 1 % IJ SOLN
INTRAMUSCULAR | Status: AC
  Filled 2024-01-21: qty 2

## 2024-01-21 MED ORDER — BUPIVACAINE-EPINEPHRINE (PF) 0.5% -1:200000 IJ SOLN
INTRAMUSCULAR | Status: AC
Start: 2024-01-21 — End: 2024-01-21
  Filled 2024-01-21: qty 30

## 2024-01-21 MED ORDER — TRANEXAMIC ACID-NACL 1000-0.7 MG/100ML-% IV SOLN
INTRAVENOUS | Status: AC
  Filled 2024-01-21: qty 100

## 2024-01-21 MED ORDER — TRANEXAMIC ACID-NACL 1000-0.7 MG/100ML-% IV SOLN
INTRAVENOUS | Status: DC | PRN
Start: 2024-01-21 — End: 2024-01-21
  Administered 2024-01-21: 1000 mg via INTRAVENOUS

## 2024-01-21 MED ORDER — SODIUM CHLORIDE 0.9 % IV SOLN
3.0000 g | INTRAVENOUS | Status: AC
  Administered 2024-01-21: 3 g via INTRAVENOUS
  Filled 2024-01-21: qty 3

## 2024-01-21 MED ORDER — SUGAMMADEX SODIUM 200 MG/2ML IV SOLN
INTRAVENOUS | Status: DC | PRN
  Administered 2024-01-21: 400 mg via INTRAVENOUS

## 2024-01-21 MED ORDER — STERILE WATER FOR IRRIGATION IR SOLN
Status: DC | PRN
  Administered 2024-01-21 (×2): 1000 mL

## 2024-01-21 MED ORDER — OXYCODONE HCL 5 MG PO TABS
5.0000 mg | ORAL_TABLET | ORAL | Status: DC | PRN
  Administered 2024-01-22: 5 mg via ORAL
  Filled 2024-01-21: qty 1

## 2024-01-21 MED ORDER — LOSARTAN POTASSIUM 50 MG PO TABS
100.0000 mg | ORAL_TABLET | Freq: Every day | ORAL | Status: DC
  Administered 2024-01-22: 100 mg via ORAL
  Filled 2024-01-21 (×2): qty 2

## 2024-01-21 MED ORDER — SODIUM CHLORIDE 0.9 % IR SOLN
Status: DC | PRN
  Administered 2024-01-21: 3000 mL
  Administered 2024-01-21: 1000 mL

## 2024-01-21 MED ORDER — LACTATED RINGERS IV SOLN: INTRAVENOUS | Status: DC

## 2024-01-21 MED ORDER — MIDAZOLAM HCL 2 MG/2ML IJ SOLN
INTRAMUSCULAR | Status: AC
  Filled 2024-01-21: qty 2

## 2024-01-21 MED ORDER — BUDESONIDE 0.25 MG/2ML IN SUSP: 0.2500 mg | Freq: Two times a day (BID) | RESPIRATORY_TRACT | Status: DC | PRN

## 2024-01-21 MED ORDER — HYDROMORPHONE HCL 1 MG/ML IJ SOLN
1.0000 mg | INTRAMUSCULAR | Status: DC | PRN
  Administered 2024-01-21 – 2024-01-22 (×3): 1 mg via INTRAVENOUS
  Filled 2024-01-21 (×3): qty 1

## 2024-01-21 MED ORDER — ONDANSETRON HCL 4 MG/2ML IJ SOLN
INTRAMUSCULAR | Status: DC | PRN
  Administered 2024-01-21: 4 mg via INTRAVENOUS

## 2024-01-21 MED ORDER — TRAZODONE HCL 50 MG PO TABS: 100.0000 mg | ORAL_TABLET | Freq: Every evening | ORAL | Status: DC | PRN

## 2024-01-21 MED ORDER — LIDOCAINE 2% (20 MG/ML) 5 ML SYRINGE
INTRAMUSCULAR | Status: DC | PRN
  Administered 2024-01-21: 100 mg via INTRAVENOUS

## 2024-01-21 MED ORDER — FENTANYL CITRATE (PF) 100 MCG/2ML IJ SOLN
INTRAMUSCULAR | Status: AC
  Filled 2024-01-21: qty 2

## 2024-01-21 MED ORDER — FENTANYL CITRATE PF 50 MCG/ML IJ SOSY
50.0000 ug | PREFILLED_SYRINGE | Freq: Once | INTRAMUSCULAR | Status: AC
  Administered 2024-01-21: 50 ug via INTRAVENOUS

## 2024-01-21 MED ORDER — MIDAZOLAM HCL 2 MG/2ML IJ SOLN
INTRAMUSCULAR | Status: DC | PRN
  Administered 2024-01-21 (×3): 1 mg via INTRAVENOUS

## 2024-01-21 MED ORDER — LIDOCAINE 2% (20 MG/ML) 5 ML SYRINGE
INTRAMUSCULAR | Status: AC
  Filled 2024-01-21: qty 5

## 2024-01-21 MED ORDER — ONDANSETRON HCL 4 MG/2ML IJ SOLN
INTRAMUSCULAR | Status: AC
  Filled 2024-01-21: qty 2

## 2024-01-21 MED ORDER — DEXMEDETOMIDINE HCL IN NACL 80 MCG/20ML IV SOLN
INTRAVENOUS | Status: DC | PRN
Start: 2024-01-21 — End: 2024-01-21
  Administered 2024-01-21: 8 ug via INTRAVENOUS

## 2024-01-21 MED ORDER — PHENYLEPHRINE 80 MCG/ML (10ML) SYRINGE FOR IV PUSH (FOR BLOOD PRESSURE SUPPORT)
PREFILLED_SYRINGE | INTRAVENOUS | Status: DC | PRN
  Administered 2024-01-21 (×10): 80 ug via INTRAVENOUS

## 2024-01-21 MED ORDER — BACITRACIN ZINC 500 UNIT/GM EX OINT
TOPICAL_OINTMENT | CUTANEOUS | Status: AC
  Filled 2024-01-21: qty 0.9

## 2024-01-21 MED ORDER — DEXAMETHASONE SODIUM PHOSPHATE 10 MG/ML IJ SOLN
INTRAMUSCULAR | Status: AC
Start: 2024-01-21 — End: 2024-01-21
  Filled 2024-01-21: qty 1

## 2024-01-21 MED ORDER — PROPOFOL 10 MG/ML IV BOLUS
INTRAVENOUS | Status: DC | PRN
  Administered 2024-01-21: 150 mg via INTRAVENOUS

## 2024-01-21 MED ORDER — ROCURONIUM BROMIDE 10 MG/ML (PF) SYRINGE
PREFILLED_SYRINGE | INTRAVENOUS | Status: AC
Start: 2024-01-21 — End: 2024-01-21
  Filled 2024-01-21: qty 10

## 2024-01-21 MED ORDER — FLUTICASONE PROPIONATE 50 MCG/ACT NA SUSP
2.0000 | Freq: Every day | NASAL | Status: DC
  Administered 2024-01-22: 2 via NASAL
  Filled 2024-01-21: qty 16

## 2024-01-21 MED ORDER — ONDANSETRON HCL 4 MG/2ML IJ SOLN: 4.0000 mg | Freq: Four times a day (QID) | INTRAMUSCULAR | Status: DC | PRN

## 2024-01-21 MED ORDER — MIDAZOLAM HCL 2 MG/2ML IJ SOLN
INTRAMUSCULAR | Status: AC
Start: 2024-01-21 — End: 2024-01-21
  Filled 2024-01-21: qty 2

## 2024-01-21 MED ORDER — ORAL CARE MOUTH RINSE: 15.0000 mL | Freq: Once | OROMUCOSAL | Status: AC

## 2024-01-21 MED ORDER — VANCOMYCIN HCL 1000 MG IV SOLR
INTRAVENOUS | Status: DC | PRN
  Administered 2024-01-21: 1000 mg

## 2024-01-21 MED ORDER — ALBUTEROL-BUDESONIDE 90-80 MCG/ACT IN AERO: 2.0000 | INHALATION_SPRAY | RESPIRATORY_TRACT | Status: DC | PRN

## 2024-01-21 MED ORDER — BACITRACIN 500 UNIT/GM EX OINT
TOPICAL_OINTMENT | CUTANEOUS | Status: DC | PRN
  Administered 2024-01-21: 1

## 2024-01-21 MED ORDER — PHENYLEPHRINE HCL-NACL 20-0.9 MG/250ML-% IV SOLN
INTRAVENOUS | Status: DC | PRN
  Administered 2024-01-21: 50 ug/min via INTRAVENOUS

## 2024-01-21 MED ORDER — CHLORHEXIDINE GLUCONATE 0.12 % MT SOLN
15.0000 mL | Freq: Once | OROMUCOSAL | Status: AC
  Administered 2024-01-21: 15 mL via OROMUCOSAL

## 2024-01-21 MED ORDER — PHENYLEPHRINE HCL-NACL 20-0.9 MG/250ML-% IV SOLN
INTRAVENOUS | Status: AC
  Filled 2024-01-21: qty 250

## 2024-01-21 MED ORDER — ROSUVASTATIN CALCIUM 10 MG PO TABS
5.0000 mg | ORAL_TABLET | Freq: Every day | ORAL | Status: DC
  Administered 2024-01-22: 5 mg via ORAL
  Filled 2024-01-21 (×2): qty 1

## 2024-01-21 MED ORDER — ROPIVACAINE HCL 5 MG/ML IJ SOLN
INTRAMUSCULAR | Status: AC
  Filled 2024-01-21: qty 30

## 2024-01-21 MED ORDER — DEXAMETHASONE SODIUM PHOSPHATE 10 MG/ML IJ SOLN
INTRAMUSCULAR | Status: DC | PRN
  Administered 2024-01-21: 10 mg via INTRAVENOUS

## 2024-01-21 MED ORDER — ALBUTEROL SULFATE (2.5 MG/3ML) 0.083% IN NEBU: 2.5000 mg | INHALATION_SOLUTION | RESPIRATORY_TRACT | Status: DC | PRN

## 2024-01-21 MED ORDER — FENTANYL CITRATE PF 50 MCG/ML IJ SOSY
PREFILLED_SYRINGE | INTRAMUSCULAR | Status: AC
  Filled 2024-01-21: qty 1

## 2024-01-21 MED ORDER — ROPIVACAINE HCL 5 MG/ML IJ SOLN
INTRAMUSCULAR | Status: DC | PRN
  Administered 2024-01-21: 30 mL via PERINEURAL

## 2024-01-21 MED ORDER — ROCURONIUM BROMIDE 10 MG/ML (PF) SYRINGE
PREFILLED_SYRINGE | INTRAVENOUS | Status: DC | PRN
  Administered 2024-01-21: 20 mg via INTRAVENOUS
  Administered 2024-01-21: 80 mg via INTRAVENOUS
  Administered 2024-01-21: 20 mg via INTRAVENOUS

## 2024-01-21 MED ORDER — VANCOMYCIN HCL 1000 MG IV SOLR
INTRAVENOUS | Status: AC
  Filled 2024-01-21: qty 20

## 2024-01-21 MED ORDER — ONDANSETRON HCL 4 MG PO TABS: 4.0000 mg | ORAL_TABLET | Freq: Four times a day (QID) | ORAL | Status: DC | PRN

## 2024-01-21 MED ORDER — OXYCODONE HCL 5 MG PO TABS
10.0000 mg | ORAL_TABLET | ORAL | Status: DC | PRN
  Administered 2024-01-21 (×2): 10 mg via ORAL
  Filled 2024-01-21 (×2): qty 2

## 2024-01-21 MED ORDER — EPHEDRINE SULFATE-NACL 50-0.9 MG/10ML-% IV SOSY
PREFILLED_SYRINGE | INTRAVENOUS | Status: DC | PRN
  Administered 2024-01-21 (×3): 5 mg via INTRAVENOUS
  Administered 2024-01-21: 10 mg via INTRAVENOUS

## 2024-01-21 MED ORDER — PHENYLEPHRINE 80 MCG/ML (10ML) SYRINGE FOR IV PUSH (FOR BLOOD PRESSURE SUPPORT)
PREFILLED_SYRINGE | INTRAVENOUS | Status: AC
Start: 2024-01-21 — End: 2024-01-21
  Filled 2024-01-21: qty 10

## 2024-01-21 MED ORDER — FENTANYL CITRATE (PF) 250 MCG/5ML IJ SOLN
INTRAMUSCULAR | Status: DC | PRN
  Administered 2024-01-21 (×2): 50 ug via INTRAVENOUS

## 2024-01-21 MED ORDER — LEVOTHYROXINE SODIUM 112 MCG PO TABS
112.0000 ug | ORAL_TABLET | Freq: Every day | ORAL | Status: DC
  Administered 2024-01-22: 112 ug via ORAL
  Filled 2024-01-21: qty 1

## 2024-01-21 MED ORDER — ACETAMINOPHEN 500 MG PO TABS
1000.0000 mg | ORAL_TABLET | Freq: Three times a day (TID) | ORAL | Status: DC
  Administered 2024-01-21 – 2024-01-22 (×3): 1000 mg via ORAL
  Filled 2024-01-21 (×3): qty 2

## 2024-01-21 MED ORDER — PROPOFOL 10 MG/ML IV BOLUS
INTRAVENOUS | Status: AC
  Filled 2024-01-21: qty 20

## 2024-01-21 SURGICAL SUPPLY — 56 items
ANCHOR SUT BIO SW 4.75X19.1 (Anchor) IMPLANT
BLADE SAW SGTL 83.5X18.5 (BLADE) ×1 IMPLANT
BNDG GAUZE DERMACEA FLUFF 4 (GAUZE/BANDAGES/DRESSINGS) IMPLANT
BODY TRUNION ECLIPSE 39 SL (Shoulder) IMPLANT
BOWL SMART MIX CTS (DISPOSABLE) ×1 IMPLANT
CEMENT HV SMART SET (Cement) ×2 IMPLANT
CHLORAPREP W/TINT 26 (MISCELLANEOUS) ×2 IMPLANT
CLOTH BEACON ORANGE TIMEOUT ST (SAFETY) ×1 IMPLANT
COOLER ICEMAN CLASSIC (MISCELLANEOUS) ×1 IMPLANT
COUNTER NDL MAGNETIC 40 RED (SET/KITS/TRAYS/PACK) ×1 IMPLANT
COUNTER NEEDLE MAGNETIC 40 RED (SET/KITS/TRAYS/PACK) ×1 IMPLANT
COVER LIGHT HANDLE STERIS (MISCELLANEOUS) ×2 IMPLANT
DRAPE SHOULDER BEACH CHAIR (DRAPES) ×1 IMPLANT
DRAPE U-SHAPE 47X51 STRL (DRAPES) ×1 IMPLANT
DRSG AQUACEL AG ADV 3.5X10 (GAUZE/BANDAGES/DRESSINGS) ×1 IMPLANT
ELECT BLADE 6 FLAT ULTRCLN (ELECTRODE) ×1 IMPLANT
ELECTRODE REM PT RTRN 9FT ADLT (ELECTROSURGICAL) ×1 IMPLANT
GLENOID WITH CLEAT SM (Miscellaneous) IMPLANT
GLOVE BIO SURGEON STRL SZ7 (GLOVE) IMPLANT
GLOVE BIO SURGEON STRL SZ8 (GLOVE) ×3 IMPLANT
GLOVE BIOGEL PI IND STRL 7.0 (GLOVE) ×4 IMPLANT
GLOVE BIOGEL PI IND STRL 8 (GLOVE) ×1 IMPLANT
GOWN STRL REUS W/TWL LRG LVL3 (GOWN DISPOSABLE) ×1 IMPLANT
GOWN STRL REUS W/TWL XL LVL3 (GOWN DISPOSABLE) ×3 IMPLANT
HEAD HUMERAL ECLIPSE 39/16 (Shoulder) IMPLANT
HOOD PEEL AWAY T7 (MISCELLANEOUS) ×3 IMPLANT
IMPL ECLIPSE SPEEDCAP (Shoulder) IMPLANT
KIT POSITION SHOULDER SCHLEI (MISCELLANEOUS) ×1 IMPLANT
KIT SET UNIVERSAL (KITS) IMPLANT
KIT TURNOVER KIT A (KITS) ×1 IMPLANT
MANIFOLD NEPTUNE II (INSTRUMENTS) ×1 IMPLANT
MARKER SKIN DUAL TIP RULER LAB (MISCELLANEOUS) ×1 IMPLANT
NDL MA TROC 1/2 (NEEDLE) ×1 IMPLANT
NEEDLE MA TROC 1/2 (NEEDLE) ×1 IMPLANT
NS IRRIG 1000ML POUR BTL (IV SOLUTION) ×1 IMPLANT
PACK SRG BSC III STRL LF ECLPS (CUSTOM PROCEDURE TRAY) ×1 IMPLANT
PACK TOTAL JOINT (CUSTOM PROCEDURE TRAY) ×1 IMPLANT
PAD ARMBOARD POSITIONER FOAM (MISCELLANEOUS) ×1 IMPLANT
PAD COLD SHLDR SM WRAP-ON (PAD) ×1 IMPLANT
PIN NITINOL TARGETER 2.8 (PIN) IMPLANT
SCREW MED ECLIPSE 35 (Screw) IMPLANT
SET BASIN LINEN APH (SET/KITS/TRAYS/PACK) ×1 IMPLANT
SET HNDPC FAN SPRY TIP SCT (DISPOSABLE) IMPLANT
SLING ARM ULTRA III XL (SLING) IMPLANT
SOL .9 NS 3000ML IRR UROMATIC (IV SOLUTION) ×1 IMPLANT
STRIP CLOSURE SKIN 1/2X4 (GAUZE/BANDAGES/DRESSINGS) ×2 IMPLANT
SUT MNCRL AB 4-0 PS2 18 (SUTURE) ×1 IMPLANT
SUT MON AB 2-0 CT1 36 (SUTURE) ×1 IMPLANT
SUT VIC AB 0 CT1 27XBRD ANTBC (SUTURE) ×1 IMPLANT
SUTURE TAPE 1.3 40 TPR END (SUTURE) ×3 IMPLANT
SUTURETAPE 1.3 40 W/NDL BLK/WH (SUTURE) ×3 IMPLANT
SYR BULB IRRIG 60ML STRL (SYRINGE) ×2 IMPLANT
TOWEL OR 17X26 4PK STRL BLUE (TOWEL DISPOSABLE) ×1 IMPLANT
TUBE CONNECTING 12X1/4 (SUCTIONS) IMPLANT
WATER STERILE IRR 1000ML POUR (IV SOLUTION) ×2 IMPLANT
YANKAUER SUCT 12FT TUBE ARGYLE (SUCTIONS) ×1 IMPLANT

## 2024-01-21 NOTE — Anesthesia Procedure Notes (Deleted)
 Anesthesia Regional Block: Interscalene brachial plexus block   Pre-Anesthetic Checklist: , timeout performed,  Correct Patient, Correct Site, Correct Laterality,  Correct Procedure, Correct Position, site marked,  Risks and benefits discussed,  Surgical consent,  Pre-op evaluation,  At surgeon's request and post-op pain management  Laterality: Right  Prep: chloraprep       Needles:  Injection technique: Single-shot  Needle Type: Stimiplex     Needle Length: 10cm  Needle Gauge: 20     Additional Needles:   Procedures:, nerve stimulator,,, ultrasound used (permanent image in chart),,    Narrative:  Start time: 01/21/2024 7:40 AM End time: 01/21/2024 7:57 AM Injection made incrementally with aspirations every 5 mL.  Performed by: Other  Anesthesiologist: Herschell Hollering, MD CRNA: Jenny Nevelyn BRAVO, RN  Additional Notes: Attempted by NORVA, completed by Hollering Herschell, MD.

## 2024-01-21 NOTE — Anesthesia Procedure Notes (Signed)
 Procedure Name: Intubation Date/Time: 01/21/2024 8:08 AM  Performed by: Harrold Macintosh, CRNAPre-anesthesia Checklist: Patient identified, Emergency Drugs available, Suction available and Patient being monitored Patient Re-evaluated:Patient Re-evaluated prior to induction Oxygen Delivery Method: Circle system utilized Preoxygenation: Pre-oxygenation with 100% oxygen Induction Type: IV induction Ventilation: Mask ventilation without difficulty Laryngoscope Size: Mac and 3 Grade View: Grade I Tube type: Oral Tube size: 7.0 mm Number of attempts: 1 Airway Equipment and Method: Stylet and Bite block Placement Confirmation: ETT inserted through vocal cords under direct vision, positive ETCO2 and breath sounds checked- equal and bilateral Secured at: 23 cm Tube secured with: Tape Dental Injury: Teeth and Oropharynx as per pre-operative assessment

## 2024-01-21 NOTE — Anesthesia Preprocedure Evaluation (Addendum)
 Anesthesia Evaluation  Patient identified by MRN, date of birth, ID band Patient awake    Reviewed: Allergy & Precautions, H&P , NPO status , Patient's Chart, lab work & pertinent test results  Airway Mallampati: II  TM Distance: >3 FB Neck ROM: Full    Dental no notable dental hx.    Pulmonary asthma , former smoker   Pulmonary exam normal breath sounds clear to auscultation       Cardiovascular hypertension, Normal cardiovascular exam Rhythm:Regular Rate:Normal     Neuro/Psych  Headaches PSYCHIATRIC DISORDERS  Depression Bipolar Disorder   CVA    GI/Hepatic negative GI ROS, Neg liver ROS,,,  Endo/Other  Hypothyroidism    Renal/GU negative Renal ROS  negative genitourinary   Musculoskeletal  (+) Arthritis ,    Abdominal   Peds negative pediatric ROS (+)  Hematology negative hematology ROS (+)   Anesthesia Other Findings   Reproductive/Obstetrics negative OB ROS                             Anesthesia Physical Anesthesia Plan  ASA: 3  Anesthesia Plan: Regional   Post-op Pain Management:    Induction: Intravenous  PONV Risk Score and Plan:   Airway Management Planned: Nasal Cannula  Additional Equipment:   Intra-op Plan:   Post-operative Plan:   Informed Consent: I have reviewed the patients History and Physical, chart, labs and discussed the procedure including the risks, benefits and alternatives for the proposed anesthesia with the patient or authorized representative who has indicated his/her understanding and acceptance.     Dental advisory given  Plan Discussed with: CRNA  Anesthesia Plan Comments:        Anesthesia Quick Evaluation

## 2024-01-21 NOTE — Op Note (Signed)
 Orthopaedic Surgery Operative Note (CSN: [id number])  Belinda Turner  1965-07-28 Date of Surgery: 01/21/2024   Diagnoses:  Right glenohumeral arthritis  Procedure: Anatomic right shoulder arthroplasty   Operative Finding Successful completion of the planned procedure.    Post-Op Diagnosis: Same Surgeons:Primary: Onesimo Oneil LABOR, MD Assistants: Montie Seltzer Location: [facility] OR ROOM 4 Anesthesia: General with regional anesthesia Antibiotics: Ancef 3 g with local vancomycin 1 g at the surgical site Tourniquet time: N/A Estimated Blood Loss: 300 cc Complications: None Specimens: None  Implants: Implant Name Type Inv. Item Serial No. Manufacturer Lot No. LRB No. Used Action  CEMENT HV SMART SET - [id number] Cement CEMENT HV SMART SET  [employer] [id number] Right 1 Implanted  GLENOID WITH CLEAT SM - [id number] Miscellaneous GLENOID WITH CLEAT SM  [employer] [id number] Right 1 Implanted  BODY TRUNION ECLIPSE 39 SL - [id number] Shoulder BODY TRUNION ECLIPSE 39 SL  [employer] [id number] Right 1 Implanted  SCREW MED ECLIPSE 35 - [id number] Screw SCREW MED ECLIPSE 35  [employer] [id number] Right 1 Implanted  HEAD HUMERAL ECLIPSE 39/16 - [id number] Shoulder HEAD HUMERAL ECLIPSE 39/16  [employer] [id number] Right 1 Implanted  IMPL ECLIPSE SPEEDCAP - [id number] Shoulder IMPL ECLIPSE SPEEDCAP  [employer] [id number] Right 1 Implanted  ANCHOR SUT BIO SW 4.75X19.1 - [id number] Anchor ANCHOR SUT BIO SW 4.75X19.1  [employer] [id number] Right 1 Implanted    Indications for Surgery:   Belinda Turner is a 58 y.o. female with progressively worsening Right shoulder pain and imaging demonstrating severe glenohumeral arthritis.  Attempts to improve the pain with medications, exercises, activity modifications and shoulder injections have not provided sufficient relief.  Patient is now complaining of worsening function and debilitating pain.  As a result, I have recommended shoulder replacement in order to  restore form and function.  Benefits and risks of operative and nonoperative management were discussed prior to surgery with the patient and informed consent form was completed.  Specific risks including infection, need for additional surgery, instability, dislocation, bleeding, damage to surrounding structures, persistent pain and more severe complications associated with anesthesia.  All questions were answered.    Procedure:   The patient was identified properly. Informed consent was obtained and the surgical site was marked. The patient was taken to the OR where general anesthesia was induced.  The patient was positioned in beach chair position with a pneumatic arm holder.  The right arm was prepped and draped in the usual sterile fashion.  Timeout was performed before the beginning of the case.  Patient received the above stated antibiotics and 1 g of TXA prior to making incision.  A standard deltopectoral approach was performed with a #10 blade. We dissected down to the subcutaneous tissues and the cephalic vein was taken laterally with the deltoid. The clavipectoral fascia was incised in line with the incision. Deep retractors were placed. The long of the biceps tendon was identified and there was significant tenosynovitis present.  Tenodesis was performed to the pectoralis tendon with #2 Fiberwire. The remaining biceps was followed up into the rotator interval where it was released. The subscapularis was taken down tendon peel off the footprint. The underlying capsul was elevated off of the humeral neck and the osteophytes inferiorly. #2 Fiberwire sutures are passed through the tendon for subscap manipulation. We continued releasing the capsule directly off of the osteophytes inferiorly all the way around the corner. This allowed us  to dislocate the humeral head. The humeral head had evidence of  severe osteoarthritic wear with full-thickness cartilage loss and exposed subchondral bone. There was  significant flattening of the humeral head.   The rotator cuff was carefully examined and noted to be intact without sign of wear.  The decision was confirmed that an anatomic total shoulder was indicated for this patient.  There were osteophytes along the inferior humeral neck. The osteophytes were removed with an osteotome and a rongeur and the anatomic neck was well visualized.   We next made our humeral osteotomy with an oscillating saw along the anatomic neck. The head fragment was passed off the back table and measured approximately 39 mm in diameter.   Bone quality was reasonable and we decided that it was appropriate to use stemless implants and placed a guidepin perpendicular to our cut using a guide.  A cut protector was placed.  The subscapularis was again identified and immediately we took care to palpate the axillary nerve anteriorly and verify its position with gentle palpation as well as the tug test.  We then released the SGHL with bovie cautery prior to placing a curved mayo at the junction of the anterior glenoid well above the axillary nerve and bluntly dissecting the subscapularis from the capsule.  We then carefully protected the axillary nerve as we gently released the inferior capsule to fully mobilize the subscapularis.  An anterior deltoid retractor was then placed as well as a small Hohmann retractor superiorly.    The glenoid was inspected and had evidence of severe osteoarthritic wear with full-thickness cartilage loss and exposed subchondral bone.  The remaining labrum was removed circumferentially taking great care not to disrupt the posterior capsule.  Based on our preoperative templating, the guide was placed in the appropriate position. The center hole was drilled through the guide.  The glenoid was reamed concentrically over the guide pin. Next the center hole was enlarged and the drill guide for the peripheral pegs was placed. The vault was intact. The peripheral pegs  were drilled in standard fashion. A trial glenoid was placed and was stable. We removed the trial components.   The glenoid bone was prepared with pulsatile lavage and a sponge to dry the bone prior to cement application. Cement was mixed on the back table the peripheral peg holes were cemented. The glenoid was impacted securely.   We turned attention back to the humeral side. The cut protector was removed.  We planned for a 39 mm trunion which was secured with an impacter.  Bone graft was placed within the cage screw.  The trunion was secured in place and the cage screw was tightened by hand, achieving excellent purchase.  We were pleased with the stability.  Next, we trialed with multiple size head options and selected a 39/16 mm head which re-created the patient's anatomy. The offset was dialed in to match the normal anatomy. The shoulder was trialed.  There was good ROM in all planes and the shoulder was stable with approximately 50% posterior spring back and no inferior translation.  Next we impacted the appropriately sized humeral head on to the tray.  The joint was reduced and thoroughly irrigated with pulsatile lavage. We placed multiple fibertak anchors around the implant, spanning the footprint of the subscapularis.  The sutures were passed through the subscapularis tendon and then secured within the biciptal groove with swivel lock anchors to complete a double row repair.  Next the rotator interval was closed with #2 fiberwire suture. Hemostasis was obtained. The deltopectoral interval was reapproximated with #  1 vicryl. The subcutaneous tissues were closed with 2-0 Vicryl and the skin was closed with a running monocryl. The incisions were cleaned and dried and an Aquacel dressing was placed. The drapes taken down. The arm was placed into sling with abduction pillow and a polar care machine.   Patient was awakened, extubated, and transferred to the recovery room in stable condition. There were no  intraoperative complications. The sponge, needle, and attention counts were correct at the end of the case.     Post-operative plan:  The patient will be admitted for over night observation To remain in the sling at all times, NWB PT to begin in the next 7-10 days.   DVT prophylaxis Aspirin 81 mg twice daily for 6 weeks, or the patient is fully ambulatory Pain control with PRN pain medication preferring oral medicines.   Follow up plan will be scheduled in approximately 10-14 days for incision check and XR

## 2024-01-21 NOTE — Transfer of Care (Signed)
 mmediate Anesthesia Transfer of Care Note  Patient: Belinda Turner  Procedure(s) Performed: ARTHROPLASTY, SHOULDER, TOTAL (Right: Shoulder)  Patient Location: PACU  Anesthesia Type:GA combined with regional for post-op pain  Level of Consciousness: awake, sedated, patient cooperative, and responds to stimulation  Airway & Oxygen Therapy: Patient Spontanous Breathing and Patient connected to face mask oxygen  Post-op Assessment: Report given to RN, Post -op Vital signs reviewed and stable, Patient moving all extremities, and Patient able to stick tongue midline  Post vital signs: Reviewed and stable  Last Vitals:  Vitals Value Taken Time  BP 102/76 01/21/24 11:09  Temp 98.4   Pulse 101 01/21/24 11:10  Resp 21 01/21/24 11:10  SpO2 97 % 01/21/24 11:10  Vitals shown include unfiled device data.  Last Pain:  Vitals:   01/21/24 0651  TempSrc: Oral  PainSc: 10-Worst pain ever      Patients Stated Pain Goal: 9 (01/21/24 9348)  Complications: No notable events documented.

## 2024-01-21 NOTE — Interval H&P Note (Signed)
 History and Physical Interval Note:  01/21/2024 7:17 AM  Belinda Turner  has presented today for surgery, with the diagnosis of Right glenohumeral arthritis.  The various methods of treatment have been discussed with the patient and family. After consideration of risks, benefits and other options for treatment, the patient has consented to  Procedure(s): ARTHROPLASTY, SHOULDER, TOTAL (Right) as a surgical intervention.  The patient's history has been reviewed, patient examined, no change in status, stable for surgery.  I have reviewed the patient's chart and labs.  Questions were answered to the patient's satisfaction.    Patient cleared for surgery.  Right shoulder pain.  Severe OA.  Plan for anatomic total shoulder arthroplasty.  All questions answered.  Ready to proceed.    Oneil [name]

## 2024-01-21 NOTE — TOC CM/SW Note (Addendum)
 Transition of Care [facility]) - Inpatient Brief Assessment   Patient Details  Name: Belinda Turner MRN: [id number] Date of Birth: 1965-11-05  Transition of Care [facility]) CM/SW Contact:    Noreen KATHEE Pinal, LCSWA Phone Number: 01/21/2024, 2:27 PM   Clinical Narrative:  Transition of Care Department [facility]) has reviewed patient and no TOC needs have been identified at this time. We will continue to monitor patient advancement through interdisciplinary progression rounds. If new patient transition needs arise, please place a TOC consult.  Pending PT evaluation.    Transition of Care Asessment: Insurance and Status: Insurance coverage has been reviewed Patient has primary care physician: Yes Home environment has been reviewed: Single Family Home Prior level of function:: Independent Prior/Current Home Services: No current home services Social Drivers of Health Review: SDOH reviewed no interventions necessary Readmission risk has been reviewed: Yes Transition of care needs: no transition of care needs at this time

## 2024-01-21 NOTE — Anesthesia Procedure Notes (Signed)
 Anesthesia Regional Block: Interscalene brachial plexus block   Pre-Anesthetic Checklist: , timeout performed,  Correct Patient, Correct Site, Correct Laterality,  Correct Procedure, Correct Position, site marked,  Risks and benefits discussed,  Surgical consent,  Pre-op evaluation,  At surgeon's request and post-op pain management  Laterality: Right  Prep: chloraprep       Needles:  Injection technique: Single-shot      Needle Length: 9cm  Needle Gauge: 18     Additional Needles:   Procedures:, nerve stimulator,,, ultrasound used (permanent image in chart),,     Nerve Stimulator or Paresthesia:  Response: yes, 0.8 mA  Additional Responses:   Narrative:  Start time: 01/21/2024 7:27 AM End time: 01/21/2024 7:53 AM  Performed by: Personally  Anesthesiologist: Herschell Hollering, MD

## 2024-01-21 NOTE — H&P (Signed)
  Below is the most recent clinic note for Belinda Turner; any pertinent information regarding their recent medical history will be updated on the day of surgery.    Orthopaedic Clinic Return  Assessment: Belinda Turner is a 58 y.o. female with the following: Right glenohumeral arthritis  Plan: Belinda Turner continues to have severe right shoulder pain.  Updated radiographs demonstrates significant loss of glenohumeral joint space, some small inferior osteophytes.  MRI also demonstrates some full-thickness cartilage loss.  8 injections have not been able to provide sustained relief.  She continues to have issues.  We discussed the possibility proceeding with surgery.  Recommend right shoulder replacement.  The procedure was discussed in detail.  Cannot proceed for another 2 months because of the recent glenohumeral joint injection..  She is interested in July.  We will confirm medical clearance.  We will then obtain a CT scan.  I will see her back in clinic to discuss the final plan.    Follow-up: After surgery   Subjective:  Right shoulder pain  History of Present Illness: Belinda Turner is a 58 y.o. female who returns to clinic for evaluation of right shoulder pain.  I have seen in clinic several times for pain in the right shoulder.  She has obtained MRI.  MRI demonstrated advanced arthritis.  She was evaluated by Dr. Burnetta.  He completed an intra-articular steroid injection.  This provided improvement in her symptoms for couple days.  She continues to have pain.  She states she cannot live like this.  She continues to take tramadol .   Review of Systems: No fevers or chills + numbness or tingling No chest pain No shortness of breath No bowel or bladder dysfunction No GI distress No headaches   Objective: BP (!) 146/85   Pulse 84   Temp 98.3 F (36.8 C) (Oral)   Resp 16   SpO2 97%   Physical Exam:  Alert and oriented.  No acute distress.    Right  shoulder with mild swelling.  She is diffusely tender to palpation.  Forward flexion is limited to 70 degrees before it is too painful.  Strength testing is deferred due to the excessive pain.  Fingers are warm and well-perfused.  Active motion in the AIN/PIN/U nerve distribution.  Slight decrease sensation to the fingers on the right hand..  IMAGING: I personally ordered and reviewed the following images:  X-rays of the right shoulder were obtained in clinic today.  These are compared to available images.  No acute injuries are noted.  Complete loss of joint space within the glenohumeral joint.  There are some small inferior osteophytes.  No dislocation.  No proximal humeral migration.  No bony lesions.  Impression: Right shoulder x-rays with advanced degenerative changes   Oneil [name], MD 01/21/2024 7:16 AM

## 2024-01-21 NOTE — Anesthesia Postprocedure Evaluation (Signed)
 Anesthesia Post Note  Patient: Belinda Turner  Procedure(s) Performed: ARTHROPLASTY, SHOULDER, TOTAL (Right: Shoulder)  Patient location during evaluation: PACU Anesthesia Type: Regional Level of consciousness: awake and alert Pain management: pain level controlled Vital Signs Assessment: post-procedure vital signs reviewed and stable Respiratory status: spontaneous breathing, nonlabored ventilation, respiratory function stable and patient connected to nasal cannula oxygen Cardiovascular status: stable and blood pressure returned to baseline Postop Assessment: no apparent nausea or vomiting Anesthetic complications: no  No notable events documented.   Last Vitals:  Vitals:   01/21/24 0748 01/21/24 1111  BP: (!) 144/70   Pulse: (!) 105   Resp: 15   Temp:  36.9 C  SpO2: 99%     Last Pain:  Vitals:   01/21/24 0651  TempSrc: Oral  PainSc: 10-Worst pain ever                 Andrea Limes

## 2024-01-22 ENCOUNTER — Telehealth: Payer: Self-pay | Admitting: Orthopedic Surgery

## 2024-01-22 ENCOUNTER — Encounter (HOSPITAL_COMMUNITY): Payer: Self-pay | Admitting: Orthopedic Surgery

## 2024-01-22 ENCOUNTER — Other Ambulatory Visit: Payer: Self-pay

## 2024-01-22 DIAGNOSIS — M19011 Primary osteoarthritis, right shoulder: Secondary | ICD-10-CM | POA: Diagnosis not present

## 2024-01-22 DIAGNOSIS — Z96611 Presence of right artificial shoulder joint: Secondary | ICD-10-CM

## 2024-01-22 DIAGNOSIS — J45909 Unspecified asthma, uncomplicated: Secondary | ICD-10-CM | POA: Diagnosis not present

## 2024-01-22 DIAGNOSIS — Z8673 Personal history of transient ischemic attack (TIA), and cerebral infarction without residual deficits: Secondary | ICD-10-CM | POA: Diagnosis not present

## 2024-01-22 DIAGNOSIS — I10 Essential (primary) hypertension: Secondary | ICD-10-CM | POA: Diagnosis not present

## 2024-01-22 DIAGNOSIS — Z87891 Personal history of nicotine dependence: Secondary | ICD-10-CM | POA: Diagnosis not present

## 2024-01-22 MED ORDER — ONDANSETRON HCL 4 MG PO TABS: 4.0000 mg | ORAL_TABLET | Freq: Three times a day (TID) | ORAL | 0 refills | Status: AC | PRN

## 2024-01-22 MED ORDER — OXYCODONE HCL 5 MG PO TABS: 5.0000 mg | ORAL_TABLET | ORAL | 0 refills | Status: AC | PRN

## 2024-01-22 MED ORDER — ACETAMINOPHEN 500 MG PO TABS: 1000.0000 mg | ORAL_TABLET | Freq: Three times a day (TID) | ORAL | 0 refills | Status: AC

## 2024-01-22 MED ORDER — ASPIRIN 81 MG PO TBEC: 81.0000 mg | DELAYED_RELEASE_TABLET | Freq: Two times a day (BID) | ORAL | 0 refills | Status: AC

## 2024-01-22 NOTE — Telephone Encounter (Signed)
 Called and spoke w/ pharmacist to fill medications for pt, she had surgery yesterday.

## 2024-01-22 NOTE — Discharge Summary (Signed)
 Patient ID: Belinda Turner MRN: [id number] DOB/AGE: [birth year] 58 y.o.  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  Tolerated procedure well.  Was kept for monitoring overnight for pain control and medical monitoring postop and was found to be stable for DC home the morning after surgery.  Patient was evaluated by OT/OT prior to discharge.  Patient was instructed on specific activity restrictions and all questions were answered.  Patient was discharged on POD#1 in stable condition.  They will contact the clinic if they have any concerns upon discharge.    Consults: None  Significant Diagnostic Studies: No additional pertinent studies  Treatments: Surgery  Discharge Exam:  Today's Vitals   01/22/24 0412 01/22/24 0553 01/22/24 0609 01/22/24 0828  BP: 130/62   (!) 118/55  Pulse: 89   83  Resp: 20   20  Temp: 97.9 F (36.6 C)   98.1 F (36.7 C)  TempSrc: Oral   Oral  SpO2: 100%   100%  PainSc:  10-Worst pain ever Asleep    There is no height or weight on file to calculate BMI.   Alert and oriented, no acute distress  Sling and ice machine fitting appropriately. Dressing is clean dry and intact Active motion intact throughout the hand Sensation intact in the axillary nerve distribution. Fingers are warm  and well perfused   CBC    Latest Ref Rng & Units 01/21/2024    1:26 PM 12/25/2023    9:20 AM 06/26/2023    9:43 AM  CBC  WBC 3.4 - 10.8 x10E3/uL  7.4  8.7   Hemoglobin 12.0 - 15.0 g/dL 86.1  85.3  86.5   Hematocrit 36.0 - 46.0 % 46.0  45.9  42.4   Platelets 150 - 450 x10E3/uL  233  255         Disposition: Discharge disposition: 01-Home or Self Care        Allergies as of 01/22/2024       Reactions   Molds & Smuts Cough, Hives, Palpitations, Shortness Of Breath, Swelling   Cortisone Nausea And Vomiting, Swelling   Elemental Sulfur Nausea And Vomiting, Swelling   Lisinopril  Cough   Vraylar  [cariprazine ] Other (See Comments)   EPS, parkinsonism like symptoms per patient         Medication List     STOP taking these medications    traMADol  50 MG tablet Commonly known as: ULTRAM        TAKE these medications    acetaminophen 500 MG tablet Commonly known as: TYLENOL Take 2 tablets (1,000 mg total) by mouth every 8 (eight) hours for 14 days.   Airsupra  90-80 MCG/ACT Aero Generic drug: Albuterol -Budesonide  Inhale 2 puffs into the lungs every 4 (four) hours as needed.   albuterol  108 (90 Base) MCG/ACT inhaler  Commonly known as: VENTOLIN  HFA TAKE 2 PUFFS BY MOUTH EVERY 6 HOURS AS NEEDED FOR WHEEZE OR SHORTNESS OF BREATH   aspirin EC 81 MG tablet Take 1 tablet (81 mg total) by mouth in the morning and at bedtime. Swallow whole.   budesonide -formoterol  80-4.5 MCG/ACT inhaler Commonly known as: Symbicort  Take 2 puffs first thing in am and then another 2 puffs about 12 hours later.   Cinnamon Plus Chromium [phone] MCG-MG Caps Generic drug: Chromium-Cinnamon Take 1,000 mcg by mouth daily.   fluticasone  50 MCG/ACT nasal spray Commonly known as: FLONASE  SPRAY 2 SPRAYS INTO EACH NOSTRIL EVERY DAY   levothyroxine  112 MCG tablet Commonly known as: SYNTHROID  TAKE 1 TABLET BY MOUTH EVERY DAY BEFORE BREAKFAST   losartan  100 MG tablet Commonly known as:  COZAAR  Take 1 tablet (100 mg total) by mouth daily.   magnesium oxide 400 MG tablet Commonly known as: MAG-OX Take 400 mg by mouth daily.   meloxicam 15 MG tablet Commonly known as: MOBIC Take 15 mg by mouth daily.   multivitamin tablet Take 1 tablet by mouth daily.   ondansetron 4 MG tablet Commonly known as: Zofran Take 1 tablet (4 mg total) by mouth every 8 (eight) hours as needed for up to 14 days for nausea or vomiting.   oxyCODONE 5 MG immediate release tablet Commonly known as: Roxicodone Take 1 tablet (5 mg total) by mouth every 4 (four) hours as needed for up to 7 days.   rosuvastatin  5 MG tablet Commonly known as: CRESTOR  Take 1 tablet (5 mg total) by mouth daily.   traZODone  100 MG tablet Commonly known as: DESYREL  TAKE 1 TABLET BY MOUTH EVERYDAY AT BEDTIME        Follow-up Information     Onesimo Oneil LABOR, MD Follow up.   Specialties: Orthopedic Surgery, Sports Medicine Why: 10-14 days for suture removal/incision check Contact information: [address] [place] [address] [phone]

## 2024-01-22 NOTE — TOC Transition Note (Signed)
 Transition of Care [facility]) - Discharge Note   Patient Details  Name: Belinda Turner MRN: [id number] Date of Birth: October 19, 1965  Transition of Care [facility]) CM/SW Contact:  Noreen KATHEE Cleotilde ISRAEL Phone Number: 01/22/2024, 9:33 AM   Clinical Narrative:    Patient is discharging today. PT recommended OPPT; CSW followed up with Dr. Onesimo about arranging OPPT. CSW was informed that there office will be arranging services for patient. TOC signing off.    Final next level of care: OP Rehab Barriers to Discharge: Barriers Resolved   Patient Goals and CMS Choice Patient states their goals for this hospitalization and ongoing recovery are:: return back home          Discharge Placement                Patient to be transferred to facility by: POV Name of family member notified: Alita - patient Patient and family notified of of transfer: 01/22/24  Discharge Plan and Services Additional resources added to the After Visit Summary for                                       Social Drivers of Health (SDOH) Interventions SDOH Screenings   Food Insecurity: No Food Insecurity (01/21/2024)  Housing: Low Risk  (01/21/2024)  Transportation Needs: No Transportation Needs (01/21/2024)  Utilities: Not At Risk (01/21/2024)  Alcohol Screen: Low Risk  (12/25/2023)  Depression (PHQ2-9): Low Risk  (12/25/2023)  Financial Resource Strain: Low Risk  (12/25/2023)  Physical Activity: Insufficiently Active (12/25/2023)  Social Connections: Moderately Integrated (01/21/2024)  Stress: Stress Concern Present (12/25/2023)  Tobacco Use: Medium Risk (01/21/2024)     Readmission Risk Interventions     No data to display

## 2024-01-22 NOTE — Evaluation (Signed)
 Physical Therapy Evaluation Patient Details Name: Belinda Turner MRN: [id number] DOB: September 13, 1965 Today's Date: 01/22/2024  History of Present Illness  Belinda Turner is a 58 y.o. female with the following:  Right glenohumeral arthritis. Status post Anatomic right shoulder arthroplasty.  Clinical Impression  Patient declined to at bed mobility training due to c/o severe low back pain when lying in bed and states she will use a recliner at home.  Patient demonstrates good return for transfers and ambulation in room/hallway without loss of balance or need for an AD. Plan:  Patient discharged from physical therapy to care of nursing for ambulation daily as tolerated for length of stay.         If plan is discharge home, recommend the following: A little help with walking and/or transfers;A little help with bathing/dressing/bathroom;Help with stairs or ramp for entrance;Assistance with cooking/housework   Can travel by private vehicle        Equipment Recommendations None recommended by PT  Recommendations for Other Services       Functional Status Assessment Patient has had a recent decline in their functional status and demonstrates the ability to make significant improvements in function in a reasonable and predictable amount of time.     Precautions / Restrictions Precautions Precautions: Fall Recall of Precautions/Restrictions: Intact Restrictions Weight Bearing Restrictions Per Provider Order: No      Mobility  Bed Mobility               General bed mobility comments: Pt declined to attempt due to pain. Reports she has a chair she can sleep  in at home.    Transfers Overall transfer level: Needs assistance   Transfers: Sit to/from Stand, Bed to chair/wheelchair/BSC Sit to Stand: Modified independent (Device/Increase time)   Step pivot transfers: Modified independent (Device/Increase time)       General transfer comment: slightly labored movement     Ambulation/Gait Ambulation/Gait assistance: Modified independent (Device/Increase time) Gait Distance (Feet): 100 Feet Assistive device: None Gait Pattern/deviations: Decreased step length - right, Decreased step length - left, Decreased stride length Gait velocity: decreased     General Gait Details: slightly labored movement without loss of  balance or need for an AD  Stairs            Wheelchair Mobility     Tilt Bed    Modified Rankin (Stroke Patients Only)       Balance Overall balance assessment: Mild deficits observed, not formally tested                                           Pertinent Vitals/Pain Pain Assessment Pain Assessment: 0-10 Pain Score: 9  Pain Location: R shoulder Pain Descriptors / Indicators: Sharp Pain Intervention(s): Limited activity within patient's tolerance, Monitored during session, Repositioned    Home Living Family/patient expects to be discharged to:: Private residence Living Arrangements: Spouse/significant other Available Help at Discharge: Family;Available 24 hours/day Type of Home: House Home Access: Level entry       Home Layout: One level Home Equipment: Agricultural consultant (2 wheels);Cane - quad;Cane - single point;Wheelchair - manual      Prior Function Prior Level of Function : Independent/Modified Independent             Mobility Comments: Tourist information centre manager without AD. ADLs Comments: Independent     Extremity/Trunk Assessment   Upper Extremity  Assessment Upper Extremity Assessment: Defer to OT evaluation RUE Deficits / Details: in sling at this time LUE Deficits / Details: Limited due to long needle IV; 2+/5 shoulder flexion; WFL grip.    Lower Extremity Assessment Lower Extremity Assessment: Overall WFL for tasks assessed    Cervical / Trunk Assessment Cervical / Trunk Assessment: Normal  Communication   Communication Communication: No apparent difficulties    Cognition  Arousal: Alert Behavior During Therapy: WFL for tasks assessed/performed   PT - Cognitive impairments: No apparent impairments                         Following commands: Intact       Cueing Cueing Techniques: Verbal cues     General Comments General comments (skin integrity, edema, etc.): R shoulder sling adjusted slightly for better fit.    Exercises     Assessment/Plan    PT Assessment All further PT needs can be met in the next venue of care  PT Problem List Decreased strength;Decreased range of motion;Decreased balance;Decreased mobility;Pain       PT Treatment Interventions      PT Goals (Current goals can be found in the Care Plan section)  Acute Rehab PT Goals Patient Stated Goal: return home with family to assist PT Goal Formulation: With patient/family Time For Goal Achievement: 01/22/24 Potential to Achieve Goals: Good    Frequency       Co-evaluation PT/OT/SLP Co-Evaluation/Treatment: Yes Reason for Co-Treatment: To address functional/ADL transfers PT goals addressed during session: Mobility/safety with mobility;Balance         AM-PAC PT 6 Clicks Mobility  Outcome Measure Help needed turning from your back to your side while in a flat bed without using bedrails?: A Little Help needed moving from lying on your back to sitting on the side of a flat bed without using bedrails?: A Lot Help needed moving to and from a bed to a chair (including a wheelchair)?: None Help needed standing up from a chair using your arms (e.g., wheelchair or bedside chair)?: None Help needed to walk in hospital room?: A Little Help needed climbing 3-5 steps with a railing? : A Little 6 Click Score: 19    End of Session   Activity Tolerance: Patient tolerated treatment well;Patient limited by fatigue;Patient limited by pain Patient left: in chair;with call bell/phone within reach;with family/visitor present Nurse Communication: Mobility status PT Visit  Diagnosis: Unsteadiness on feet (R26.81);Other abnormalities of gait and mobility (R26.89);Muscle weakness (generalized) (M62.81)    Time: 9164-9147 PT Time Calculation (min) (ACUTE ONLY): 17 min   Charges:   PT Evaluation $PT Eval Low Complexity: 1 Low PT Treatments $Therapeutic Activity: 8-22 mins PT General Charges $$ ACUTE PT VISIT: 1 Visit         10:12 AM, 01/22/24 Lynwood Music, MPT Physical Therapist with [facility] [phone] office [phone] mobile phone

## 2024-01-22 NOTE — Plan of Care (Signed)
   Problem: Education: Goal: Knowledge of General Education information will improve Description: Including pain rating scale, medication(s)/side effects and non-pharmacologic comfort measures Outcome: Progressing   Problem: Health Behavior/Discharge Planning: Goal: Ability to manage health-related needs will improve Outcome: Progressing   Problem: Clinical Measurements: Goal: Will remain free from infection Outcome: Progressing

## 2024-01-22 NOTE — Discharge Instructions (Addendum)
 Belinda Mccomber A. Onesimo, MD MS [facility] [address] [place],  [place]  [address] Phone: [phone] Fax: [phone]    POST-OPERATIVE INSTRUCTIONS - TOTAL SHOULDER REPLACEMENT    WOUND CARE You may leave the operative dressing in place until your follow-up appointment. KEEP THE INCISIONS CLEAN AND DRY. There may be a small amount of fluid/bleeding leaking at the surgical site. This is normal after surgery.  If it fills with liquid or blood please call us  immediately to change it for you. Use the provided ice machine or Ice packs as often as possible for the first 3-4 days, then as needed for pain relief.  Keep a layer of cloth or a shirt between your skin and the cooling unit to prevent frost bite as it can get very cold.  SHOWERING: - You may shower on Post-Op Day #3.  - The dressing is water resistant but do not scrub it as it may start to peel up.   - You may remove the sling for showering, but keep a water resistant pillow under the arm to keep both the  elbow and shoulder away from the body (mimicking the abduction sling).  - Gently pat the area dry.  - Do not soak the shoulder in water. Do not go swimming in the pool or ocean until your sutures are removed. - KEEP THE INCISIONS CLEAN AND DRY.  EXERCISES Wear the sling at all times except when doing your exercises. You may remove the sling for showering, but keep the arm across the chest or in a secondary sling.    Accidental/Purposeful External Rotation and shoulder flexion (reaching behind you) is to be avoided at all costs for the first month. It is ok to come out of your sling if your are sitting and have assistance for eating.  Do not lift anything heavier than 1 pound until we discuss it further in clinic. Please perform the exercises:   Elbow / Hand / Wrist  Range of Motion Exercises Grip strengthening   REGIONAL ANESTHESIA (NERVE BLOCKS) The anesthesia team may have performed a nerve block  for you if safe in the setting of your care.  This is a great tool used to minimize pain.  Typically the block may start wearing off overnight but the long acting medicine may last for 3-4 days.  The nerve block wearing off can be a challenging period but please utilize your as needed pain medications to try and manage this period.    POST-OP MEDICATIONS- Multimodal approach to pain control In general your pain will be controlled with a combination of substances.  Prescriptions unless otherwise discussed are electronically sent to your pharmacy.  This is a carefully made plan we use to minimize narcotic use.     Meloxicam OR Celebrex - Anti-inflammatory medication taken on a scheduled basis Acetaminophen - Non-narcotic pain medicine taken on a scheduled basis  Oxycodone - This is a strong narcotic, to be used only on an "as needed" basis for pain. Aspirin 81mg  - This medicine is used to minimize the risk of blood clots after surgery. Zofran -  take as needed for nausea  Meloxicam/Celebrex - these are anti-inflammatory and pain relievers.  Do not take additional ibuprofen, naproxen or other NSAID while taking this medicine.   FOLLOW-UP If you develop a Fever (>101.5), Redness or Drainage from the surgical incision site, please call our office to arrange for an evaluation. Please call the office to schedule a follow-up appointment  for a wound check, 7-10 days post-operatively.     NARCOTICS MANAGEMENT  Per [facility] clinic policy, our goal is ensure optimal postoperative pain control with a multimodal pain management strategy.  For all [facility] patients, our goal is to wean post-operative narcotic medications by 6 weeks post-operatively.   If this is not possible due to utilization of pain medication prior to surgery, your [facility] doctor will support your acute post-operative pain control for the first 6 weeks postoperatively, with a plan to transition you back to your primary pain team  following that. Maralee will work to ensure a Therapist, occupational.

## 2024-01-22 NOTE — Evaluation (Signed)
 Occupational Therapy Evaluation Patient Details Name: Belinda Turner MRN: [id number] DOB: [birth year] Today's Date: 01/22/2024   History of Present Illness   Belinda Turner is a 58 y.o. female with the following:  Right glenohumeral arthritis. Status post Anatomic right shoulder arthroplasty. (per MD)     Clinical Impressions Pt agreeable to OT and PT co-evaluation. Pt reports husband being home to assist with ADL tasks. Pt was independent prior to the operation. R UE immobilized in the sling which was adjusted slightly for better fit. Pt will need assist for ADL's at home but husband can provide needed level of support. Pt able to ambulate with slow pace but no assist needed. Pt left in the chair with call bell within reach. Pt is not recommended for further acute OT services and will be discharged to care of nursing staff for remaining length of stay.        If plan is discharge home, recommend the following:   A lot of help with bathing/dressing/bathroom;Assistance with cooking/housework;Assist for transportation     Functional Status Assessment   Patient has had a recent decline in their functional status and demonstrates the ability to make significant improvements in function in a reasonable and predictable amount of time.     Equipment Recommendations   None recommended by OT             Precautions/Restrictions   Precautions Precautions: Fall Recall of Precautions/Restrictions: Intact Restrictions Weight Bearing Restrictions Per Provider Order: No     Mobility Bed Mobility               General bed mobility comments: Pt declined to attempt due to pain. Reports she has a chair she can sleep  in at home.    Transfers Overall transfer level: Needs assistance   Transfers: Sit to/from Stand, Bed to chair/wheelchair/BSC Sit to Stand: Modified independent (Device/Increase time)     Step pivot transfers: Modified independent  (Device/Increase time)     General transfer comment: increased time      Balance Overall balance assessment: Mild deficits observed, not formally tested                                         ADL either performed or assessed with clinical judgement   ADL Overall ADL's : Needs assistance/impaired     Grooming: Set up;Minimal assistance;Sitting       Lower Body Bathing: Maximal assistance;Sitting/lateral leans   Upper Body Dressing : Minimal assistance;Sitting   Lower Body Dressing: Maximal assistance;Sitting/lateral leans   Toilet Transfer: Modified Independent;Supervision/safety;Ambulation Toilet Transfer Details (indicate cue type and reason): Simualted via ambulation in the room. Toileting- Clothing Manipulation and Hygiene: Moderate assistance;Sitting/lateral lean       Functional mobility during ADLs: Modified independent General ADL Comments: Able to ambualte with slow pace     Vision Baseline Vision/History: 1 Wears glasses Ability to See in Adequate Light: 1 Impaired Patient Visual Report: No change from baseline Vision Assessment?: No apparent visual deficits     Perception Perception: Not tested       Praxis Praxis: Not tested       Pertinent Vitals/Pain Pain Assessment Pain Assessment: 0-10 Pain Score: 9  Pain Location: R shoulder Pain Descriptors / Indicators: Sharp Pain Intervention(s): Limited activity within patient's tolerance, Monitored during session, Repositioned     Extremity/Trunk Assessment Upper Extremity Assessment Upper Extremity Assessment:  RUE deficits/detail;LUE deficits/detail RUE Deficits / Details: in sling at this time LUE Deficits / Details: Limited due to long needle IV; 2+/5 shoulder flexion; WFL grip.   Lower Extremity Assessment Lower Extremity Assessment: Defer to PT evaluation   Cervical / Trunk Assessment Cervical / Trunk Assessment: Normal   Communication Communication Communication: No  apparent difficulties   Cognition Arousal: Alert Behavior During Therapy: WFL for tasks assessed/performed Cognition: No apparent impairments                               Following commands: Intact       Cueing  General Comments   Cueing Techniques: Verbal cues  R shoulder sling adjusted slightly for better fit.              Home Living Family/patient expects to be discharged to:: Private residence Living Arrangements: Spouse/significant other Available Help at Discharge: Family;Available 24 hours/day Type of Home: House Home Access: Level entry     Home Layout: One level     Bathroom Shower/Tub: Producer, television/film/video: Standard Bathroom Accessibility: Yes How Accessible: Accessible via walker Home Equipment: Rolling Walker (2 wheels);Cane - quad;Cane - single point;Wheelchair - manual          Prior Functioning/Environment Prior Level of Function : Independent/Modified Independent             Mobility Comments: Tourist information centre manager without AD. ADLs Comments: Independent     AM-PAC OT 6 Clicks Daily Activity     Outcome Measure Help from another person eating meals?: A Little Help from another person taking care of personal grooming?: A Little Help from another person toileting, which includes using toliet, bedpan, or urinal?: A Lot Help from another person bathing (including washing, rinsing, drying)?: A Lot Help from another person to put on and taking off regular upper body clothing?: A Little Help from another person to put on and taking off regular lower body clothing?: A Lot 6 Click Score: 15   End of Session Equipment Utilized During Treatment: Other (comment) (R sling)  Activity Tolerance: Patient tolerated treatment well Patient left: in chair;with call bell/phone within reach;with family/visitor present  OT Visit Diagnosis: Muscle weakness (generalized) (M62.81)                Time: 9163-9151 OT Time  Calculation (min): 12 min Charges:  OT General Charges $OT Visit: 1 Visit OT Evaluation $OT Eval Low Complexity: 1 Low  Zebulon Gantt OT, MOT   [employer] 01/22/2024, 9:21 AM

## 2024-01-22 NOTE — Telephone Encounter (Signed)
 DR. ONESIMO Gosling with [facility] called and states her prescription is flagging on there system and they want to knjow if you want to still fill the oxycodone 5mg .  Because she also has a script on file for Tramadol  last time filled 01/14/24.  Same doctor she wants to know if you are switching to the oxycodone just to make sure because she has been taking the Tramadol  for some time.    Please call her back at  [phone]

## 2024-01-23 ENCOUNTER — Other Ambulatory Visit: Payer: Self-pay

## 2024-01-23 DIAGNOSIS — Z96611 Presence of right artificial shoulder joint: Secondary | ICD-10-CM

## 2024-01-23 NOTE — Telephone Encounter (Signed)
 Letter completed. Thank you

## 2024-01-23 NOTE — Telephone Encounter (Signed)
 Tammy,    Will you please assist with pt return to work (01/31/19) forms.

## 2024-01-29 ENCOUNTER — Telehealth: Payer: Self-pay | Admitting: Orthopedic Surgery

## 2024-01-29 NOTE — Telephone Encounter (Signed)
 Per Belinda Turner pt submitted medical release form, forms, and payment 20.00

## 2024-01-31 NOTE — Therapy (Signed)
 OUTPATIENT PHYSICAL THERAPY EVALUATION   Patient Name: Belinda Turner MRN: [id number] DOB:May 19, 1966, 58 y.o., female Today's Date: 02/01/2024  END OF SESSION:  PT End of Session - 02/01/24 1328     Visit Number 1    Number of Visits 15    Date for PT Re-Evaluation 04/25/24    PT Start Time 1100    PT Stop Time 1140    PT Time Calculation (min) 40 min    Activity Tolerance Patient tolerated treatment well;Patient limited by fatigue;Patient limited by pain    Behavior During Therapy [facility] for tasks assessed/performed          Past Medical History:  Diagnosis Date   Acute non-recurrent ethmoidal sinusitis 05/10/2023   Asthma    Bipolar 1 disorder (HCC)    Depression    Hypertension    Migraine    Nasal congestion 05/10/2023   Sore throat 05/10/2023   Stroke (HCC) 2016   2 strokes  A little weaker on right side and 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   Thyroid  disease    Past Surgical History:  Procedure Laterality Date   CESAREAN SECTION  2005   CESAREAN SECTION  2006   CHOLECYSTECTOMY  2014   COLONOSCOPY WITH PROPOFOL  N/A 03/01/2020   Procedure: COLONOSCOPY WITH PROPOFOL ;  Surgeon: Shaaron Lamar HERO, MD;  Location: [facility];  Service: Endoscopy;  Laterality: N/A;  2:00pm   THYROIDECTOMY  2013   TOTAL ABDOMINAL HYSTERECTOMY  2010   TOTAL SHOULDER ARTHROPLASTY Right 01/21/2024   Procedure: ARTHROPLASTY, SHOULDER, TOTAL;  Surgeon: Onesimo Oneil LABOR, MD;  Location: [facility];  Service: Orthopedics;  Laterality: Right;   Patient Active Problem List   Diagnosis Date Noted   Arthritis of right glenohumeral joint 01/21/2024   Morbid obesity (HCC) 02/01/2023   Unilateral primary osteoarthritis, right knee 03/21/2022   History of stroke 03/31/2021   Other fatigue 05/10/2020   Paresthesia 05/10/2020   Primary hypertension 03/24/2020   Bipolar 1 disorder (HCC)    Depression    Asthma    Postoperative hypothyroidism 06/26/2014    PCP: Joesph Annabella HERO,  FNP   REFERRING PROVIDER: Onesimo Oneil LABOR, MD   REFERRING DIAG: [phone] (ICD-10-CM) - Status post total shoulder arthroplasty, right   Rationale for Evaluation and Treatment:  Rehabiliation  THERAPY DIAG:  Acute pain of right shoulder  Muscle weakness (generalized)  ONSET DATE: DOS 01/21/24   SUBJECTIVE:  SUBJECTIVE STATEMENT:She relays shoulder is doing okay since surgery, does complain of some itching and burning. Her dressing should be removed Tuesday at follow up. She is left handed.   PERTINENT HISTORY:  See above PMH  PAIN:  NPRS scale: 8/10 upon arrival Pain location:right shoulder front and back Pain description: constant, sharp, burning Aggravating factors: touching it, moving it Relieving factors: recliner and resting it on pillow. , She does ice but says it does not help.   PRECAUTIONS: ,  Shoulder post op  RED FLAGS: None   WEIGHT BEARING RESTRICTIONS:  No  FALLS:  Has patient fallen in last 6 months? No   OCCUPATION:  Logistics, computer work   PLOF:  Independent  PATIENT GOALS:  Get arm back to normal  OBJECTIVE:  Note: Objective measures were completed at Evaluation unless otherwise noted.  PATIENT SURVEYS:  Patient-Specific Activity Scoring Scheme  0 represents "unable to perform." 10 represents "able to perform at prior level. 0 1 2 3 4 5 6 7 8 9  10 (Date and Score)   Activity Eval     1. Lifting with right arm  0    2. bathing 3    3. Household chores 2   4.    5.    Score 1.66/10    Total score = sum of the activity scores/number of activities Minimum detectable change (90%CI) for average score = 2 points Minimum detectable change (90%CI) for single activity score = 3 points   Shoulder   Hand dominance: Left  UPPER EXTREMITY  ROM:  Passive ROM Right eval Left eval  Shoulder flexion 70   Shoulder extension    Shoulder abduction 50   Shoulder adduction    Shoulder extension    Shoulder internal rotation    Shoulder external rotation 20   Elbow flexion    Elbow extension    Wrist flexion    Wrist extension    Wrist ulnar deviation    Wrist radial deviation    Wrist pronation    Wrist supination     (Blank rows = not tested)   UPPER EXTREMITY MMT:  MMT Right eval Left eval  Shoulder flexion 1   Shoulder extension    Shoulder abduction 1   Shoulder adduction    Shoulder extension    Shoulder internal rotation 3   Shoulder external rotation 2   Middle trapezius    Lower trapezius    Elbow flexion    Elbow extension    Wrist flexion    Wrist extension    Wrist ulnar deviation    Wrist radial deviation    Wrist pronation    Wrist supination    Grip strength     (Blank rows = not tested)                                                                                                                               TREATMENT DATE:  Eval HEP creation and review with demonstration and trial set preformed, see below for details Selfcare:instructions to keep exercises gentle and in painfree ROM, discussed healing times and her restrictions per protocol.     PATIENT EDUCATION: Education details: HEP, PT plan of care, selfcare Person educated: Patient Education method: Explanation, Demonstration, Verbal cues, and Handouts Education comprehension: verbalized understanding, further education recommended   HOME EXERCISE PROGRAM: Access Code: [email or web address] URL: [email or web address] Date: 02/01/2024 Prepared by: Redell Moose  Exercises - Seated Shoulder External Rotation AAROM with Cane and Hand in Neutral  - 3 x daily - 6 x weekly - 1 sets - 10 reps - Seated Scapular Retraction  - 3 x daily - 6 x weekly - 1 sets - 10 reps - Circular Shoulder Pendulum with Table Support  - 3 x  daily - 6 x weekly - 1 sets - 15 reps - Seated Shoulder Flexion Towel Slide at Table Top  - 3 x daily - 6 x weekly - 1 sets - 10 reps - Seated Shoulder Abduction Towel Slide at Table Top (Mirrored)  - 3 x daily - 6 x weekly - 1 sets - 10 reps - 5 hold - Seated Shoulder External Rotation PROM on Table (Mirrored)  - 2-3 x daily - 6 x weekly - 1 sets - 10 reps - 5 hold  ASSESSMENT:  CLINICAL IMPRESSION: Patient referred to PT S/P Right shoulder replacement 01/21/24. Patient will benefit from skilled PT to improve overall function and to address impairments and limitations listed below.  OBJECTIVE IMPAIRMENTS: decreased activity tolerance for ADL's,, decreased endurance, decreased mobility, decreased ROM, decreased strength, impaired flexibility, impaired UE use, and pain.  ACTIVITY LIMITATIONSlifting, carry, reaching, cleaning,  driving, and or occupation  PERSONAL FACTORS: see above PMH  also affecting patient's functional outcome.  REHAB POTENTIAL: Good  CLINICAL DECISION MAKING: Stable/uncomplicated  EVALUATION COMPLEXITY: Low    GOALS: Short term PT Goals Target date: 02/29/2024   Pt will be I and compliant with HEP. Baseline:  Goal status: New Pt will decrease pain by 25% overall with arm movements Baseline: Goal status: New  Long term PT goals Target date:04/25/2024   Pt will improve Rt sholder AROM to [facility] to improve functional mobility Baseline: Goal status: New Pt will improve  strength to at least 4+/5 MMT to improve functional strength Baseline: Goal status: New Pt will improve Patient specific functional scale (PSFS) to at least 7/10 to show improved function level Baseline: Goal status: New Pt will reduce pain to overall less than 3/10 with usual activity and work activity. Baseline: Goal status: New  PLAN: PT FREQUENCY: 1-3 times per week   PT DURATION: 8-12 weeks  PLANNED INTERVENTIONS (unless contraindicated):  97110-Therapeutic exercises, 97530-  Therapeutic activity, [id number]- Neuromuscular re-education, 97535- Self Care, [id number]- Manual therapy, [id number]- Electrical stimulation (unattended), 97016- Vasopneumatic device, and 97035- Ultrasound  PLAN FOR NEXT SESSION:  No lifting >5 lbs No IR/backwards extension  NEXT MD VISIT: 02/05/24  Redell JONELLE Moose, PT,DPT 02/01/2024, 1:30 PM

## 2024-02-01 ENCOUNTER — Ambulatory Visit: Attending: Orthopedic Surgery | Admitting: Physical Therapy

## 2024-02-01 ENCOUNTER — Encounter: Payer: Self-pay | Admitting: Physical Therapy

## 2024-02-01 DIAGNOSIS — M25511 Pain in right shoulder: Secondary | ICD-10-CM | POA: Insufficient documentation

## 2024-02-01 DIAGNOSIS — Z96611 Presence of right artificial shoulder joint: Secondary | ICD-10-CM | POA: Insufficient documentation

## 2024-02-01 DIAGNOSIS — M6281 Muscle weakness (generalized): Secondary | ICD-10-CM | POA: Insufficient documentation

## 2024-02-05 ENCOUNTER — Ambulatory Visit (INDEPENDENT_AMBULATORY_CARE_PROVIDER_SITE_OTHER): Admitting: Orthopedic Surgery

## 2024-02-05 ENCOUNTER — Other Ambulatory Visit (INDEPENDENT_AMBULATORY_CARE_PROVIDER_SITE_OTHER): Payer: Self-pay

## 2024-02-05 DIAGNOSIS — M19011 Primary osteoarthritis, right shoulder: Secondary | ICD-10-CM | POA: Diagnosis not present

## 2024-02-05 MED ORDER — OXYCODONE HCL 5 MG PO TABS
5.0000 mg | ORAL_TABLET | ORAL | 0 refills | Status: AC | PRN
Start: 2024-02-05 — End: 2024-02-12

## 2024-02-05 NOTE — Progress Notes (Unsigned)
 Orthopaedic Postop Note  Assessment: Belinda Turner is a 58 y.o. female s/p Right Anatomic Total Shoulder Arthroplasty  DOS: 01/21/2024  Plan: Sutures were trimmed, steri strips were placed Recommend continued use of the sling; can stop using the sling around 4 weeks postop Physical therapy prescription and protocol provided: She has already started Anticipated progression discussed, XR reviewed in clinic Follow up 4 weeks   Meds ordered this encounter  Medications   oxyCODONE  (ROXICODONE ) 5 MG immediate release tablet    Sig: Take 1 tablet (5 mg total) by mouth every 4 (four) hours as needed for up to 7 days.    Dispense:  30 tablet    Refill:  0     Follow-up: Return in about 4 weeks (around 03/04/2024).  XR at next visit: Right shoulder  Subjective:  Chief Complaint  Patient presents with   Routine Post Op    R TSA DOS 01/21/24    History of Present Illness: Belinda Turner is a 58 y.o. female who presents following the above stated procedure.  Surgery was approximately 2 weeks ago.  She doing well overall.  Pain is controlled.  She has stopped using the sling.  She comes into clinic today without the sling.  She is very protective of her shoulder.  No numbness or tingling  Review of Systems: No fevers or chills No numbness or tingling No Chest Pain No shortness of breath   Objective: There were no vitals taken for this visit.  Physical Exam:  Alert and oriented, no acute distress  Surgical incision is healing well, no surrounding erythema or drainage Sensation intact in the axillary nerve distribution Active motion intact in the hand 2+ radial pulse Sensation intact throughout the hand Tolerates gentle range of motion of the shoulder - passive forward flexion to 90, passive abduction to 90   IMAGING: I personally ordered and reviewed the following images:  XR of the Right shoulder obtained in clinic today and demonstrate Anatomic Total Shoulder  Arthroplasty  with implants in good position.  No evidence of acute injury or subsidence of implants.  Alignment remains unchanged compared to immediate postop XR.  Impression: Right shoulder arthroplasty in stable position   Oneil [name], MD 02/05/2024 1:53 PM

## 2024-02-06 ENCOUNTER — Encounter: Payer: Self-pay | Admitting: Orthopedic Surgery

## 2024-02-06 ENCOUNTER — Ambulatory Visit: Admitting: Physical Therapy

## 2024-02-06 ENCOUNTER — Encounter: Payer: Self-pay | Admitting: Physical Therapy

## 2024-02-06 DIAGNOSIS — M6281 Muscle weakness (generalized): Secondary | ICD-10-CM

## 2024-02-06 DIAGNOSIS — M25511 Pain in right shoulder: Secondary | ICD-10-CM | POA: Diagnosis not present

## 2024-02-06 DIAGNOSIS — Z96611 Presence of right artificial shoulder joint: Secondary | ICD-10-CM | POA: Diagnosis not present

## 2024-02-06 NOTE — Therapy (Signed)
 OUTPATIENT PHYSICAL THERAPY TREATMENT   Patient Name: Belinda Turner MRN: [id number] DOB:1965-09-09, 58 y.o., female Today's Date: 02/06/2024  END OF SESSION:  PT End of Session - 02/06/24 0915     Visit Number 2    Number of Visits 15    Date for PT Re-Evaluation 04/25/24    PT Start Time 0910    PT Stop Time 1005    PT Time Calculation (min) 55 min    Activity Tolerance Patient tolerated treatment well;Patient limited by fatigue;Patient limited by pain    Behavior During Therapy [facility] for tasks assessed/performed          Past Medical History:  Diagnosis Date   Acute non-recurrent ethmoidal sinusitis 05/10/2023   Asthma    Bipolar 1 disorder (HCC)    Depression    Hypertension    Migraine    Nasal congestion 05/10/2023   Sore throat 05/10/2023   Stroke (HCC) 2016   2 strokes  A little weaker on right side and 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   Thyroid  disease    Past Surgical History:  Procedure Laterality Date   CESAREAN SECTION  2005   CESAREAN SECTION  2006   CHOLECYSTECTOMY  2014   COLONOSCOPY WITH PROPOFOL  N/A 03/01/2020   Procedure: COLONOSCOPY WITH PROPOFOL ;  Surgeon: Shaaron Lamar HERO, MD;  Location: [facility];  Service: Endoscopy;  Laterality: N/A;  2:00pm   THYROIDECTOMY  2013   TOTAL ABDOMINAL HYSTERECTOMY  2010   TOTAL SHOULDER ARTHROPLASTY Right 01/21/2024   Procedure: ARTHROPLASTY, SHOULDER, TOTAL;  Surgeon: Onesimo Oneil LABOR, MD;  Location: [facility];  Service: Orthopedics;  Laterality: Right;   Patient Active Problem List   Diagnosis Date Noted   Arthritis of right glenohumeral joint 01/21/2024   Morbid obesity (HCC) 02/01/2023   Unilateral primary osteoarthritis, right knee 03/21/2022   History of stroke 03/31/2021   Other fatigue 05/10/2020   Paresthesia 05/10/2020   Primary hypertension 03/24/2020   Bipolar 1 disorder (HCC)    Depression    Asthma    Postoperative hypothyroidism 06/26/2014    PCP: Joesph Annabella HERO,  FNP   REFERRING PROVIDER: Joesph Annabella HERO, FNP   REFERRING DIAG: [phone] (ICD-10-CM) - Status post total shoulder arthroplasty, right   Rationale for Evaluation and Treatment:  Rehabiliation  THERAPY DIAG:  Acute pain of right shoulder  Muscle weakness (generalized)  ONSET DATE: DOS 01/21/24   SUBJECTIVE:  SUBJECTIVE STATEMENT:She relays good report from MD  PERTINENT HISTORY:  See above PMH  PAIN:  NPRS scale: 8/10 upon arrival Pain location:right shoulder front and back Pain description: constant, sharp, burning Aggravating factors: touching it, moving it Relieving factors: recliner and resting it on pillow. , She does ice but says it does not help.   PRECAUTIONS: ,  Shoulder post op  RED FLAGS: None   WEIGHT BEARING RESTRICTIONS:  No  FALLS:  Has patient fallen in last 6 months? No   OCCUPATION:  Logistics, computer work   PLOF:  Independent  PATIENT GOALS:  Get arm back to normal  OBJECTIVE:  Note: Objective measures were completed at Evaluation unless otherwise noted.  PATIENT SURVEYS:  Patient-Specific Activity Scoring Scheme  0 represents "unable to perform." 10 represents "able to perform at prior level. 0 1 2 3 4 5 6 7 8 9  10 (Date and Score)   Activity Eval     1. Lifting with right arm  0    2. bathing 3    3. Household chores 2   4.    5.    Score 1.66/10    Total score = sum of the activity scores/number of activities Minimum detectable change (90%CI) for average score = 2 points Minimum detectable change (90%CI) for single activity score = 3 points   Shoulder   Hand dominance: Left  UPPER EXTREMITY ROM:  Passive ROM Right eval Left eval  Shoulder flexion 70   Shoulder extension    Shoulder abduction 50   Shoulder adduction     Shoulder extension    Shoulder internal rotation    Shoulder external rotation 20   Elbow flexion    Elbow extension    Wrist flexion    Wrist extension    Wrist ulnar deviation    Wrist radial deviation    Wrist pronation    Wrist supination     (Blank rows = not tested)   UPPER EXTREMITY MMT:  MMT Right eval Left eval  Shoulder flexion 1   Shoulder extension    Shoulder abduction 1   Shoulder adduction    Shoulder extension    Shoulder internal rotation 3   Shoulder external rotation 2   Middle trapezius    Lower trapezius    Elbow flexion    Elbow extension    Wrist flexion    Wrist extension    Wrist ulnar deviation    Wrist radial deviation    Wrist pronation    Wrist supination    Grip strength     (Blank rows = not tested)                                                                                                                               TREATMENT DATE:  02/06/24 Pendulums X 10 CW, CCW, A-P, lateral Seated elbow AROM X 10 reps Seated scapular retraction X 10 reps Seated shoulder  circles X 10 forward, X 10 backward Seated UE ranger AAROM X 10 reps flexion, circles CW,CCW, scaption Seated pulleys PROM 2 min flexion Seated sub max isometrics 5 sec X 10 flexion, ER Seated table stretch PROM flexion X 10, abduction X 10 Seated AAROM with cane ER ROM X 10    PATIENT EDUCATION: Education details: HEP, PT plan of care, selfcare Person educated: Patient Education method: Explanation, Demonstration, Verbal cues, and Handouts Education comprehension: verbalized understanding, further education recommended   HOME EXERCISE PROGRAM: Access Code: [email or web address] URL: [email or web address] Date: 02/01/2024 Prepared by: Redell Moose  Exercises - Seated Shoulder External Rotation AAROM with Cane and Hand in Neutral  - 3 x daily - 6 x weekly - 1 sets - 10 reps - Seated Scapular Retraction  - 3 x daily - 6 x weekly - 1 sets - 10 reps -  Circular Shoulder Pendulum with Table Support  - 3 x daily - 6 x weekly - 1 sets - 15 reps - Seated Shoulder Flexion Towel Slide at Table Top  - 3 x daily - 6 x weekly - 1 sets - 10 reps - Seated Shoulder Abduction Towel Slide at Table Top (Mirrored)  - 3 x daily - 6 x weekly - 1 sets - 10 reps - 5 hold - Seated Shoulder External Rotation PROM on Table (Mirrored)  - 2-3 x daily - 6 x weekly - 1 sets - 10 reps - 5 hold  ASSESSMENT:  CLINICAL IMPRESSION: HEP reviewed and she shows good understanding of these. I did encourage her to get a set of pulleys for home to help with her ROM. Overall her ROM has improved since her first day.   OBJECTIVE IMPAIRMENTS: decreased activity tolerance for ADL's,, decreased endurance, decreased mobility, decreased ROM, decreased strength, impaired flexibility, impaired UE use, and pain.  ACTIVITY LIMITATIONSlifting, carry, reaching, cleaning,  driving, and or occupation  PERSONAL FACTORS: see above PMH  also affecting patient's functional outcome.  REHAB POTENTIAL: Good  CLINICAL DECISION MAKING: Stable/uncomplicated  EVALUATION COMPLEXITY: Low    GOALS: Short term PT Goals Target date: 02/29/2024   Pt will be I and compliant with HEP. Baseline:  Goal status: New Pt will decrease pain by 25% overall with arm movements Baseline: Goal status: New  Long term PT goals Target date:04/25/2024   Pt will improve Rt sholder AROM to [facility] to improve functional mobility Baseline: Goal status: New Pt will improve  strength to at least 4+/5 MMT to improve functional strength Baseline: Goal status: New Pt will improve Patient specific functional scale (PSFS) to at least 7/10 to show improved function level Baseline: Goal status: New Pt will reduce pain to overall less than 3/10 with usual activity and work activity. Baseline: Goal status: New  PLAN: PT FREQUENCY: 1-3 times per week   PT DURATION: 8-12 weeks  PLANNED INTERVENTIONS (unless  contraindicated):  97110-Therapeutic exercises, 97530- Therapeutic activity, [id number]- Neuromuscular re-education, 97535- Self Care, 02859- Manual therapy, G0283- Electrical stimulation (unattended), 97016- Vasopneumatic device, and 97035- Ultrasound  PLAN FOR NEXT SESSION:  Progress ROM as tolerated No lifting >5 lbs No IR/backwards extension  NEXT MD VISIT: 02/05/24  Redell JONELLE Moose, PT,DPT 02/06/2024, 9:17 AM

## 2024-02-07 ENCOUNTER — Encounter: Payer: Self-pay | Admitting: Orthopedic Surgery

## 2024-02-07 ENCOUNTER — Ambulatory Visit (INDEPENDENT_AMBULATORY_CARE_PROVIDER_SITE_OTHER): Admitting: Orthopedic Surgery

## 2024-02-07 DIAGNOSIS — M19011 Primary osteoarthritis, right shoulder: Secondary | ICD-10-CM

## 2024-02-07 DIAGNOSIS — Z96611 Presence of right artificial shoulder joint: Secondary | ICD-10-CM

## 2024-02-07 NOTE — Progress Notes (Signed)
 Unscheduled postop visit status post right total shoulder.  Date of surgery was June 30.  Patient had her suture ends clipped on the 15th  Encounter Diagnoses  Name Primary?   Glenohumeral arthritis, right Yes   Status post total shoulder arthroplasty, right      She called in complaining of bloody drainage  Wound checked  Wound does not look infected please see media no antibiotics were started but patient will follow-up in a week with Dr. Onesimo

## 2024-02-11 ENCOUNTER — Ambulatory Visit: Admitting: Physical Therapy

## 2024-02-11 ENCOUNTER — Encounter: Payer: Self-pay | Admitting: Physical Therapy

## 2024-02-11 DIAGNOSIS — M25511 Pain in right shoulder: Secondary | ICD-10-CM

## 2024-02-11 DIAGNOSIS — M6281 Muscle weakness (generalized): Secondary | ICD-10-CM | POA: Diagnosis not present

## 2024-02-11 DIAGNOSIS — Z96611 Presence of right artificial shoulder joint: Secondary | ICD-10-CM | POA: Diagnosis not present

## 2024-02-11 NOTE — Therapy (Addendum)
 OUTPATIENT PHYSICAL THERAPY TREATMENT   Patient Name: Belinda Turner MRN: [id number] DOB:[birth year], 58 y.o., female Today's Date: 02/11/2024  END OF SESSION:  PT End of Session - 02/11/24 0802     Visit Number 3    Number of Visits 15    Date for PT Re-Evaluation 04/25/24    PT Start Time 0755    PT Stop Time 0833    PT Time Calculation (min) 38 min    Activity Tolerance Patient tolerated treatment well;Patient limited by fatigue;Patient limited by pain    Behavior During Therapy [facility] for tasks assessed/performed          Past Medical History:  Diagnosis Date   Acute non-recurrent ethmoidal sinusitis 05/10/2023   Asthma    Bipolar 1 disorder (HCC)    Depression    Hypertension    Migraine    Nasal congestion 05/10/2023   Sore throat 05/10/2023   Stroke (HCC) 2016   2 strokes  A little weaker on right side and 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   Thyroid  disease    Past Surgical History:  Procedure Laterality Date   CESAREAN SECTION  2005   CESAREAN SECTION  2006   CHOLECYSTECTOMY  2014   COLONOSCOPY WITH PROPOFOL  N/A 03/01/2020   Procedure: COLONOSCOPY WITH PROPOFOL ;  Surgeon: Shaaron Lamar HERO, MD;  Location: [facility];  Service: Endoscopy;  Laterality: N/A;  2:00pm   THYROIDECTOMY  2013   TOTAL ABDOMINAL HYSTERECTOMY  2010   TOTAL SHOULDER ARTHROPLASTY Right 01/21/2024   Procedure: ARTHROPLASTY, SHOULDER, TOTAL;  Surgeon: Onesimo Oneil LABOR, MD;  Location: [facility];  Service: Orthopedics;  Laterality: Right;   Patient Active Problem List   Diagnosis Date Noted   Arthritis of right glenohumeral joint 01/21/2024   Morbid obesity (HCC) 02/01/2023   Unilateral primary osteoarthritis, right knee 03/21/2022   History of stroke 03/31/2021   Other fatigue 05/10/2020   Paresthesia 05/10/2020   Primary hypertension 03/24/2020   Bipolar 1 disorder (HCC)    Depression    Asthma    Postoperative hypothyroidism 06/26/2014    PCP: Joesph Annabella HERO,  FNP   REFERRING PROVIDER: Joesph Annabella HERO, FNP   REFERRING DIAG: [phone] (ICD-10-CM) - Status post total shoulder arthroplasty, right   Rationale for Evaluation and Treatment:  Rehabiliation  THERAPY DIAG:  Acute pain of right shoulder  Muscle weakness (generalized)  ONSET DATE: DOS 01/21/24   SUBJECTIVE:  SUBJECTIVE STATEMENT:She relays her incision was bleeding some so went to MD office and had 2 new steristrips applied and that no puss or drainage, just bleeding  PERTINENT HISTORY:  See above PMH  PAIN:  NPRS scale: did not rate, does say her pain was better Pain location:right shoulder front and back Pain description: constant, sharp, burning Aggravating factors: touching it, moving it Relieving factors: recliner and resting it on pillow. , She does ice but says it does not help.   PRECAUTIONS: ,  Shoulder post op  RED FLAGS: None   WEIGHT BEARING RESTRICTIONS:  No  FALLS:  Has patient fallen in last 6 months? No   OCCUPATION:  Logistics, computer work   PLOF:  Independent  PATIENT GOALS:  Get arm back to normal  OBJECTIVE:  Note: Objective measures were completed at Evaluation unless otherwise noted.  PATIENT SURVEYS:  Patient-Specific Activity Scoring Scheme  0 represents "unable to perform." 10 represents "able to perform at prior level. 0 1 2 3 4 5 6 7 8 9  10 (Date and Score)   Activity Eval     1. Lifting with right arm  0    2. bathing 3    3. Household chores 2   4.    5.    Score 1.66/10    Total score = sum of the activity scores/number of activities Minimum detectable change (90%CI) for average score = 2 points Minimum detectable change (90%CI) for single activity score = 3 points   Shoulder   Hand dominance: Left  UPPER EXTREMITY  ROM:  Passive ROM Right eval Left eval  Shoulder flexion 70   Shoulder extension    Shoulder abduction 50   Shoulder adduction    Shoulder extension    Shoulder internal rotation    Shoulder external rotation 20   Elbow flexion    Elbow extension    Wrist flexion    Wrist extension    Wrist ulnar deviation    Wrist radial deviation    Wrist pronation    Wrist supination     (Blank rows = not tested)   UPPER EXTREMITY MMT:  MMT Right eval Left eval  Shoulder flexion 1   Shoulder extension    Shoulder abduction 1   Shoulder adduction    Shoulder extension    Shoulder internal rotation 3   Shoulder external rotation 2   Middle trapezius    Lower trapezius    Elbow flexion    Elbow extension    Wrist flexion    Wrist extension    Wrist ulnar deviation    Wrist radial deviation    Wrist pronation    Wrist supination    Grip strength     (Blank rows = not tested)  TREATMENT DATE:  02/11/24 Pendulums X 15 CW, CCW, A-P, lateral Seated elbow AROM X 50 reps Seated scapular retraction X 15 reps Seated shoulder circles X 15 forward, X 15 backward Seated UE ranger AAROM X 15 reps flexion, circles CW,CCW, scaption Standing large blue pball rolls across high mat table, X 15 flexion, X 15 scaption Seated pulleys PROM 2 min flexion Seated sub max isometrics 5 sec X 15 flexion, ER, IR, abd Seated AAROM with cane flexion X 10 Seated AAROM with cane ER ROM X 10  02/06/24 Pendulums X 10 CW, CCW, A-P, lateral Seated elbow AROM X 10 reps Seated scapular retraction X 10 reps Seated shoulder circles X 10 forward, X 10 backward Seated UE ranger AAROM X 10 reps flexion, circles CW,CCW, scaption Seated pulleys PROM 2 min flexion Seated sub max isometrics 5 sec X 10 flexion, ER Seated table stretch PROM flexion X 10, abduction X 10 Seated AAROM with  cane ER ROM X 10    PATIENT EDUCATION: Education details: HEP, PT plan of care, selfcare Person educated: Patient Education method: Explanation, Demonstration, Verbal cues, and Handouts Education comprehension: verbalized understanding, further education recommended   HOME EXERCISE PROGRAM: Access Code: [email or web address] URL: [email or web address] Date: 02/01/2024 Prepared by: Redell Moose  Exercises - Seated Shoulder External Rotation AAROM with Cane and Hand in Neutral  - 3 x daily - 6 x weekly - 1 sets - 10 reps - Seated Scapular Retraction  - 3 x daily - 6 x weekly - 1 sets - 10 reps - Circular Shoulder Pendulum with Table Support  - 3 x daily - 6 x weekly - 1 sets - 15 reps - Seated Shoulder Flexion Towel Slide at Table Top  - 3 x daily - 6 x weekly - 1 sets - 10 reps - Seated Shoulder Abduction Towel Slide at Table Top (Mirrored)  - 3 x daily - 6 x weekly - 1 sets - 10 reps - 5 hold - Seated Shoulder External Rotation PROM on Table (Mirrored)  - 2-3 x daily - 6 x weekly - 1 sets - 10 reps - 5 hold  ASSESSMENT:  CLINICAL IMPRESSION: She shows good understanding and early compliance with HEP. She had improved AAROM noted today and some improvements in exercise tolerance as well.    OBJECTIVE IMPAIRMENTS: decreased activity tolerance for ADL's,, decreased endurance, decreased mobility, decreased ROM, decreased strength, impaired flexibility, impaired UE use, and pain.  ACTIVITY LIMITATIONSlifting, carry, reaching, cleaning,  driving, and or occupation  PERSONAL FACTORS: see above PMH  also affecting patient's functional outcome.  REHAB POTENTIAL: Good  CLINICAL DECISION MAKING: Stable/uncomplicated  EVALUATION COMPLEXITY: Low    GOALS: Short term PT Goals Target date: 02/29/2024   Pt will be I and compliant with HEP. Baseline:  Goal status: MET 02/11/24 Pt will decrease pain by 25% overall with arm movements Baseline: Goal status: ongoing 02/11/24  Long term  PT goals Target date:04/25/2024   Pt will improve Rt sholder AROM to [facility] to improve functional mobility Baseline: Goal status: ongoing 02/11/24 Pt will improve  strength to at least 4+/5 MMT to improve functional strength Baseline: Goal status: ongoing 02/11/24 Pt will improve Patient specific functional scale (PSFS) to at least 7/10 to show improved function level Baseline: Goal status: ongoing 02/11/24 Pt will reduce pain to overall less than 3/10 with usual activity and work activity. Baseline: Goal status: ongoing 02/11/24  PLAN: PT FREQUENCY: 1-3 times per week   PT DURATION: 8-12 weeks  PLANNED INTERVENTIONS (unless  contraindicated):  97110-Therapeutic exercises, 97530- Therapeutic activity, [id number]- Neuromuscular re-education, 97535- Self Care, 02859- Manual therapy, G0283- Electrical stimulation (unattended), 97016- Vasopneumatic device, and 97035- Ultrasound  PLAN FOR NEXT SESSION:  Needs MD note Progress ROM as tolerated No lifting >5 lbs No IR/backwards extension  NEXT MD VISIT: 7/12325  Redell JONELLE Moose, PT,DPT 02/11/2024, 8:35 AM

## 2024-02-13 ENCOUNTER — Encounter: Admitting: Orthopedic Surgery

## 2024-02-13 ENCOUNTER — Ambulatory Visit: Admitting: Physical Therapy

## 2024-02-13 ENCOUNTER — Encounter: Payer: Self-pay | Admitting: Physical Therapy

## 2024-02-13 ENCOUNTER — Encounter: Payer: Self-pay | Admitting: Orthopedic Surgery

## 2024-02-13 DIAGNOSIS — M25511 Pain in right shoulder: Secondary | ICD-10-CM | POA: Diagnosis not present

## 2024-02-13 DIAGNOSIS — Z96611 Presence of right artificial shoulder joint: Secondary | ICD-10-CM | POA: Diagnosis not present

## 2024-02-13 DIAGNOSIS — M6281 Muscle weakness (generalized): Secondary | ICD-10-CM | POA: Diagnosis not present

## 2024-02-13 MED ORDER — OXYCODONE HCL 5 MG PO TABS: 5.0000 mg | ORAL_TABLET | ORAL | 0 refills | Status: DC | PRN

## 2024-02-13 NOTE — Therapy (Signed)
 OUTPATIENT PHYSICAL THERAPY TREATMENT   Patient Name: Belinda Turner MRN: [id number] DOB:11/15/1965, 58 y.o., female Today's Date: 02/13/2024  END OF SESSION:  PT End of Session - 02/13/24 0806     Visit Number 4    Number of Visits 15    Date for PT Re-Evaluation 04/25/24    PT Start Time 0755    PT Stop Time 0825    PT Time Calculation (min) 30 min    Activity Tolerance Patient limited by pain    Behavior During Therapy [facility] for tasks assessed/performed           Past Medical History:  Diagnosis Date   Acute non-recurrent ethmoidal sinusitis 05/10/2023   Asthma    Bipolar 1 disorder (HCC)    Depression    Hypertension    Migraine    Nasal congestion 05/10/2023   Sore throat 05/10/2023   Stroke (HCC) 2016   2 strokes  A little weaker on right side and 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   Thyroid  disease    Past Surgical History:  Procedure Laterality Date   CESAREAN SECTION  2005   CESAREAN SECTION  2006   CHOLECYSTECTOMY  2014   COLONOSCOPY WITH PROPOFOL  N/A 03/01/2020   Procedure: COLONOSCOPY WITH PROPOFOL ;  Surgeon: Shaaron Lamar HERO, MD;  Location[facility];  Service: Endoscopy;  Laterality: N/A;  2:00pm   THYROIDECTOMY  2013   TOTAL ABDOMINAL HYSTERECTOMY  2010   TOTAL SHOULDER ARTHROPLASTY Right 01/21/2024   Procedure: ARTHROPLASTY, SHOULDER, TOTAL;  Surgeon: Onesimo Oneil LABOR, MD;  Location: [facility];  Service: Orthopedics;  Laterality: Right;   Patient Active Problem List   Diagnosis Date Noted   Arthritis of right glenohumeral joint 01/21/2024   Morbid obesity (HCC) 02/01/2023   Unilateral primary osteoarthritis, right knee 03/21/2022   History of stroke 03/31/2021   Other fatigue 05/10/2020   Paresthesia 05/10/2020   Primary hypertension 03/24/2020   Bipolar 1 disorder (HCC)    Depression    Asthma    Postoperative hypothyroidism 06/26/2014    PCP: Joesph Annabella HERO, FNP   REFERRING PROVIDER: Joesph Annabella HERO, FNP   REFERRING DIAG:  [phone] (ICD-10-CM) - Status post total shoulder arthroplasty, right   Rationale for Evaluation and Treatment:  Rehabiliation  THERAPY DIAG:  Acute pain of right shoulder  Muscle weakness (generalized)  ONSET DATE: DOS 01/21/24   SUBJECTIVE:                                                                                                                                                                                           SUBJECTIVE STATEMENT:She  relays a lot more pain today and unsure why PERTINENT HISTORY:  See above PMH  PAIN:  NPRS scale: 9/10 Pain location:right shoulder front and back Pain description: constant, sharp, burning Aggravating factors: touching it, moving it Relieving factors: recliner and resting it on pillow. , She does ice but says it does not help.   PRECAUTIONS: ,  Shoulder post op  RED FLAGS: None   WEIGHT BEARING RESTRICTIONS:  No  FALLS:  Has patient fallen in last 6 months? No   OCCUPATION:  Logistics, computer work   PLOF:  Independent  PATIENT GOALS:  Get arm back to normal  OBJECTIVE:  Note: Objective measures were completed at Evaluation unless otherwise noted.  PATIENT SURVEYS:  Patient-Specific Activity Scoring Scheme  0 represents "unable to perform." 10 represents "able to perform at prior level. 0 1 2 3 4 5 6 7 8 9  10 (Date and Score)   Activity Eval     1. Lifting with right arm  0    2. bathing 3    3. Household chores 2   4.    5.    Score 1.66/10    Total score = sum of the activity scores/number of activities Minimum detectable change (90%CI) for average score = 2 points Minimum detectable change (90%CI) for single activity score = 3 points   Shoulder   Hand dominance: Left  UPPER EXTREMITY ROM:  Passive ROM Right eval Left eval  Shoulder flexion 70   Shoulder extension    Shoulder abduction 50   Shoulder adduction    Shoulder extension    Shoulder internal rotation    Shoulder external  rotation 20   Elbow flexion    Elbow extension    Wrist flexion    Wrist extension    Wrist ulnar deviation    Wrist radial deviation    Wrist pronation    Wrist supination     (Blank rows = not tested)   UPPER EXTREMITY MMT:  MMT Right eval Left eval  Shoulder flexion 1   Shoulder extension    Shoulder abduction 1   Shoulder adduction    Shoulder extension    Shoulder internal rotation 3   Shoulder external rotation 2   Middle trapezius    Lower trapezius    Elbow flexion    Elbow extension    Wrist flexion    Wrist extension    Wrist ulnar deviation    Wrist radial deviation    Wrist pronation    Wrist supination    Grip strength     (Blank rows = not tested)                                                                                                                               TREATMENT DATE:  02/13/24 Pendulums X 10 CW, CCW, A-P, lateral Seated elbow AROM X 50 reps Seated scapular retraction X 10 reps Seated UE ranger  AAROM X 10 reps flexion, circles CW,CCW, scaption Standing large blue pball rolls across high mat table, X 10 flexion, X 10 scaption Estim X 7 min to left shoulder IFC, acute pain protocol, intensity turned up to tolerance (discontinued early at her request as relays it was causing burning sensation)   02/11/24 Pendulums X 15 CW, CCW, A-P, lateral Seated elbow AROM X 50 reps Seated scapular retraction X 15 reps Seated shoulder circles X 15 forward, X 15 backward Seated UE ranger AAROM X 15 reps flexion, circles CW,CCW, scaption Standing large blue pball rolls across high mat table, X 15 flexion, X 15 scaption Seated pulleys PROM 2 min flexion Seated sub max isometrics 5 sec X 15 flexion, ER, IR, abd Seated AAROM with cane flexion X 10 Seated AAROM with cane ER ROM X 10   PATIENT EDUCATION: Education details: HEP, PT plan of care, selfcare Person educated: Patient Education method: Explanation, Demonstration, Verbal cues, and  Handouts Education comprehension: verbalized understanding, further education recommended   HOME EXERCISE PROGRAM: Access Code: [email or web address] URL: [email or web address] Date: 02/01/2024 Prepared by: Redell Moose  Exercises - Seated Shoulder External Rotation AAROM with Cane and Hand in Neutral  - 3 x daily - 6 x weekly - 1 sets - 10 reps - Seated Scapular Retraction  - 3 x daily - 6 x weekly - 1 sets - 10 reps - Circular Shoulder Pendulum with Table Support  - 3 x daily - 6 x weekly - 1 sets - 15 reps - Seated Shoulder Flexion Towel Slide at Table Top  - 3 x daily - 6 x weekly - 1 sets - 10 reps - Seated Shoulder Abduction Towel Slide at Table Top (Mirrored)  - 3 x daily - 6 x weekly - 1 sets - 10 reps - 5 hold - Seated Shoulder External Rotation PROM on Table (Mirrored)  - 2-3 x daily - 6 x weekly - 1 sets - 10 reps - 5 hold  ASSESSMENT:  CLINICAL IMPRESSION: She was limited by pain today. Backed down on the reps and held some of the exercises due to this. Encouraged her to rest her arm more and hold off on the HEP exercises for a couple days until pain calms down. Discussed when her arm is this aggravated not to push it further into more aggravation. She will follow up with MD next Tuesday after PT session.   OBJECTIVE IMPAIRMENTS: decreased activity tolerance for ADL's,, decreased endurance, decreased mobility, decreased ROM, decreased strength, impaired flexibility, impaired UE use, and pain.  ACTIVITY LIMITATIONSlifting, carry, reaching, cleaning,  driving, and or occupation  PERSONAL FACTORS: see above PMH  also affecting patient's functional outcome.  REHAB POTENTIAL: Good  CLINICAL DECISION MAKING: Stable/uncomplicated  EVALUATION COMPLEXITY: Low    GOALS: Short term PT Goals Target date: 02/29/2024   Pt will be I and compliant with HEP. Baseline:  Goal status: MET 02/11/24 Pt will decrease pain by 25% overall with arm movements Baseline: Goal status:  ongoing 02/11/24  Long term PT goals Target date:04/25/2024   Pt will improve Rt sholder AROM to [facility] to improve functional mobility Baseline: Goal status: ongoing 02/11/24 Pt will improve  strength to at least 4+/5 MMT to improve functional strength Baseline: Goal status: ongoing 02/11/24 Pt will improve Patient specific functional scale (PSFS) to at least 7/10 to show improved function level Baseline: Goal status: ongoing 02/11/24 Pt will reduce pain to overall less than 3/10 with usual activity and work activity. Baseline: Goal status: ongoing 02/11/24  PLAN: PT FREQUENCY: 1-3 times per week   PT DURATION: 8-12 weeks  PLANNED INTERVENTIONS (unless contraindicated):  97110-Therapeutic exercises, 97530- Therapeutic activity, [id number]- Neuromuscular re-education, 97535- Self Care, 02859- Manual therapy, G0283- Electrical stimulation (unattended), 97016- Vasopneumatic device, and 97035- Ultrasound  PLAN FOR NEXT SESSION:  Needs MD note Progress ROM as tolerated No lifting >5 lbs No IR/backwards extension  NEXT MD VISIT: 02/19/24  Redell JONELLE Moose, PT,DPT 02/13/2024, 8:32 AM

## 2024-02-15 ENCOUNTER — Telehealth (HOSPITAL_BASED_OUTPATIENT_CLINIC_OR_DEPARTMENT_OTHER): Payer: Self-pay

## 2024-02-15 ENCOUNTER — Telehealth: Payer: Self-pay | Admitting: Orthopedic Surgery

## 2024-02-15 NOTE — Telephone Encounter (Signed)
 Called patient back and it rang and recording came on that the call can not  be completed at dialed  x3 times

## 2024-02-15 NOTE — Telephone Encounter (Signed)
 Patient called and said the medication [facility] sent they need more information. CB#[phone]

## 2024-02-19 ENCOUNTER — Ambulatory Visit: Admitting: Physical Therapy

## 2024-02-19 ENCOUNTER — Encounter: Payer: Self-pay | Admitting: Orthopedic Surgery

## 2024-02-19 ENCOUNTER — Ambulatory Visit (INDEPENDENT_AMBULATORY_CARE_PROVIDER_SITE_OTHER): Admitting: Orthopedic Surgery

## 2024-02-19 ENCOUNTER — Telehealth: Payer: Self-pay | Admitting: Orthopedic Surgery

## 2024-02-19 ENCOUNTER — Telehealth: Payer: Self-pay

## 2024-02-19 ENCOUNTER — Encounter: Payer: Self-pay | Admitting: Physical Therapy

## 2024-02-19 DIAGNOSIS — Z96611 Presence of right artificial shoulder joint: Secondary | ICD-10-CM

## 2024-02-19 DIAGNOSIS — M19011 Primary osteoarthritis, right shoulder: Secondary | ICD-10-CM

## 2024-02-19 DIAGNOSIS — M25511 Pain in right shoulder: Secondary | ICD-10-CM | POA: Diagnosis not present

## 2024-02-19 DIAGNOSIS — M6281 Muscle weakness (generalized): Secondary | ICD-10-CM

## 2024-02-19 MED ORDER — OXYCODONE HCL 5 MG PO TABS: 5.0000 mg | ORAL_TABLET | ORAL | 0 refills | Status: AC | PRN

## 2024-02-19 NOTE — Telephone Encounter (Signed)
 Patient called to let Dr. Onesimo know that her insurance needs to be contacted so she can get her pain medication. I saw where she had sent a mychart message about 15 minutes ago.

## 2024-02-19 NOTE — Progress Notes (Signed)
 Orthopaedic Postop Note  Assessment: Belinda Turner is a 58 y.o. female s/p Right Anatomic Total Shoulder Arthroplasty  DOS: 01/21/2024  Plan: Belinda Turner is doing well.  Her incision looks better.  There is a small scab, which will continue to heal.  Continue to work with therapy.  Nothing obviously wrong after her popping sensation while at PT earlier today.  Follow-up in 2 weeks, including new x-rays.  Meds ordered this encounter  Medications   oxyCODONE  (ROXICODONE ) 5 MG immediate release tablet    Sig: Take 1 tablet (5 mg total) by mouth every 4 (four) hours as needed for up to 5 days.    Dispense:  20 tablet    Refill:  0     Follow-up: Return in about 2 weeks (around 03/04/2024).  XR at next visit: Right shoulder  Subjective:  Chief Complaint  Patient presents with   Post-op Follow-up    Incision check right shoulder / no drainage no redness    Shoulder Pain    Right / pulley snapped today in PT increased pain / also needs Percocet sent in with the diagnosis code on the RX so they will fill it at pharmacy.     History of Present Illness: Belinda Turner is a 58 y.o. female who presents following the above stated procedure.  Surgery was approximately 4 weeks ago.  She has been doing well.  She does continue to need pain medicines.  She is working with physical therapy.  She was at therapy today, which she felt a popping sensation.  Since then, she has had pains radiating in the upper arm, distally towards the elbow.  No numbness or tingling.   Review of Systems: No fevers or chills No numbness or tingling No Chest Pain No shortness of breath   Objective: There were no vitals taken for this visit.  Physical Exam:  Alert and oriented, no acute distress  Surgical incision is healing appropriately.  There is a small scab in the distal aspect of the incision.  No drainage.  No surrounding redness.  Passive forward flexion to 100 degrees.  Passive abduction  to 90 degrees.  She is able to resist subscapularis strength testing performed  IMAGING: I personally ordered and reviewed the following images:  No new imaging obtained today.   Oneil [name], MD 02/19/2024 1:57 PM

## 2024-02-19 NOTE — Therapy (Signed)
 OUTPATIENT PHYSICAL THERAPY TREATMENT   Patient Name: Belinda Turner MRN: [id number] DOB:10/27/1965, 58 y.o., female Today's Date: 02/19/2024  END OF SESSION:  PT End of Session - 02/19/24 1006     Visit Number 5    Number of Visits 15    Date for PT Re-Evaluation 04/25/24    PT Start Time 0930    PT Stop Time 1015    PT Time Calculation (min) 45 min    Activity Tolerance Patient limited by pain    Behavior During Therapy [facility] for tasks assessed/performed           Past Medical History:  Diagnosis Date   Acute non-recurrent ethmoidal sinusitis 05/10/2023   Asthma    Bipolar 1 disorder (HCC)    Depression    Hypertension    Migraine    Nasal congestion 05/10/2023   Sore throat 05/10/2023   Stroke (HCC) 2016   2 strokes  A little weaker on right side and 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   Thyroid  disease    Past Surgical History:  Procedure Laterality Date   CESAREAN SECTION  2005   CESAREAN SECTION  2006   CHOLECYSTECTOMY  2014   COLONOSCOPY WITH PROPOFOL  N/A 03/01/2020   Procedure: COLONOSCOPY WITH PROPOFOL ;  Surgeon: Shaaron Lamar HERO, MD;  Location: [facility];  Service: Endoscopy;  Laterality: N/A;  2:00pm   THYROIDECTOMY  2013   TOTAL ABDOMINAL HYSTERECTOMY  2010   TOTAL SHOULDER ARTHROPLASTY Right 01/21/2024   Procedure: ARTHROPLASTY, SHOULDER, TOTAL;  Surgeon: Onesimo Oneil LABOR, MD;  Location: [facility];  Service: Orthopedics;  Laterality: Right;   Patient Active Problem List   Diagnosis Date Noted   Arthritis of right glenohumeral joint 01/21/2024   Morbid obesity (HCC) 02/01/2023   Unilateral primary osteoarthritis, right knee 03/21/2022   History of stroke 03/31/2021   Other fatigue 05/10/2020   Paresthesia 05/10/2020   Primary hypertension 03/24/2020   Bipolar 1 disorder (HCC)    Depression    Asthma    Postoperative hypothyroidism 06/26/2014    PCP: Joesph Annabella HERO, FNP   REFERRING PROVIDER: Joesph Annabella HERO, FNP   REFERRING DIAG:  [phone] (ICD-10-CM) - Status post total shoulder arthroplasty, right   Rationale for Evaluation and Treatment:  Rehabiliation  THERAPY DIAG:  Acute pain of right shoulder  Muscle weakness (generalized)  ONSET DATE: DOS 01/21/24   SUBJECTIVE:                                                                                                                                                                                           SUBJECTIVE STATEMENT:She  relays she continues to be in a lot of pain since last week. She will meet with MD today.  PERTINENT HISTORY:  See above PMH  PAIN:  NPRS scale: 9/10 Pain location:right shoulder front and back Pain description: constant, sharp, burning Aggravating factors: touching it, moving it Relieving factors: recliner and resting it on pillow. , She does ice but says it does not help.   PRECAUTIONS: ,  Shoulder post op  RED FLAGS: None   WEIGHT BEARING RESTRICTIONS:  No  FALLS:  Has patient fallen in last 6 months? No   OCCUPATION:  Logistics, computer work   PLOF:  Independent  PATIENT GOALS:  Get arm back to normal  OBJECTIVE:  Note: Objective measures were completed at Evaluation unless otherwise noted.  PATIENT SURVEYS:  Patient-Specific Activity Scoring Scheme  0 represents "unable to perform." 10 represents "able to perform at prior level. 0 1 2 3 4 5 6 7 8 9  10 (Date and Score)   Activity Eval     1. Lifting with right arm  0    2. bathing 3    3. Household chores 2   4.    5.    Score 1.66/10    Total score = sum of the activity scores/number of activities Minimum detectable change (90%CI) for average score = 2 points Minimum detectable change (90%CI) for single activity score = 3 points   Shoulder   Hand dominance: Left  UPPER EXTREMITY ROM:  Passive ROM Right eval Right 02/19/24  Shoulder flexion 70 90  Shoulder extension    Shoulder abduction 50 80  Shoulder adduction    Shoulder  extension    Shoulder internal rotation    Shoulder external rotation 20 40  Elbow flexion    Elbow extension    Wrist flexion    Wrist extension    Wrist ulnar deviation    Wrist radial deviation    Wrist pronation    Wrist supination     (Blank rows = not tested)   UPPER EXTREMITY MMT:  MMT Right eval Left eval  Shoulder flexion 1   Shoulder extension    Shoulder abduction 1   Shoulder adduction    Shoulder extension    Shoulder internal rotation 3   Shoulder external rotation 2   Middle trapezius    Lower trapezius    Elbow flexion    Elbow extension    Wrist flexion    Wrist extension    Wrist ulnar deviation    Wrist radial deviation    Wrist pronation    Wrist supination    Grip strength     (Blank rows = not tested)                                                                                                                               TREATMENT DATE:  02/13/24 Seated shoulder circles X 10 anterior and posterior Seated elbow AROM  X 15 reps Seated scapular retraction X 15 reps Seated UE ranger AAROM X 10 reps flexion, circles CW,CCW, scaption Standing shoulder pulleys PROM 2X15 flexion Estim X 10 min to left shoulder IFC, IFC with intensity turned up to tolerance.    02/11/24 Pendulums X 15 CW, CCW, A-P, lateral Seated elbow AROM X 50 reps Seated scapular retraction X 15 reps Seated shoulder circles X 15 forward, X 15 backward Seated UE ranger AAROM X 15 reps flexion, circles CW,CCW, scaption Standing large blue pball rolls across high mat table, X 15 flexion, X 15 scaption Seated pulleys PROM 2 min flexion Seated sub max isometrics 5 sec X 15 flexion, ER, IR, abd Seated AAROM with cane flexion X 10 Seated AAROM with cane ER ROM X 10   PATIENT EDUCATION: Education details: HEP, PT plan of care, selfcare Person educated: Patient Education method: Explanation, Demonstration, Verbal cues, and Handouts Education comprehension: verbalized  understanding, further education recommended   HOME EXERCISE PROGRAM: Access Code: [email or web address] URL: [email or web address] Date: 02/01/2024 Prepared by: Redell Moose  Exercises - Seated Shoulder External Rotation AAROM with Cane and Hand in Neutral  - 3 x daily - 6 x weekly - 1 sets - 10 reps - Seated Scapular Retraction  - 3 x daily - 6 x weekly - 1 sets - 10 reps - Circular Shoulder Pendulum with Table Support  - 3 x daily - 6 x weekly - 1 sets - 15 reps - Seated Shoulder Flexion Towel Slide at Table Top  - 3 x daily - 6 x weekly - 1 sets - 10 reps - Seated Shoulder Abduction Towel Slide at Table Top (Mirrored)  - 3 x daily - 6 x weekly - 1 sets - 10 reps - 5 hold - Seated Shoulder External Rotation PROM on Table (Mirrored)  - 2-3 x daily - 6 x weekly - 1 sets - 10 reps - 5 hold  ASSESSMENT:  CLINICAL IMPRESSION: She was again limited by pain today and her pain levels have been very high since last week limiting her exercise tolerance for her right shoulder. She will follow up with MD today after PT session.   OBJECTIVE IMPAIRMENTS: decreased activity tolerance for ADL's,, decreased endurance, decreased mobility, decreased ROM, decreased strength, impaired flexibility, impaired UE use, and pain.  ACTIVITY LIMITATIONSlifting, carry, reaching, cleaning,  driving, and or occupation  PERSONAL FACTORS: see above PMH  also affecting patient's functional outcome.  REHAB POTENTIAL: Good  CLINICAL DECISION MAKING: Stable/uncomplicated  EVALUATION COMPLEXITY: Low    GOALS: Short term PT Goals Target date: 02/29/2024   Pt will be I and compliant with HEP. Baseline:  Goal status: MET 02/11/24 Pt will decrease pain by 25% overall with arm movements Baseline: Goal status: ongoing 02/19/24  Long term PT goals Target date:04/25/2024   Pt will improve Rt sholder AROM to [facility] to improve functional mobility Baseline: Goal status: ongoing 02/19/24 Pt will improve  strength to at  least 4+/5 MMT to improve functional strength Baseline: Goal status: ongoing 02/19/24 Pt will improve Patient specific functional scale (PSFS) to at least 7/10 to show improved function level Baseline: Goal status: ongoing 02/19/24 Pt will reduce pain to overall less than 3/10 with usual activity and work activity. Baseline: Goal status: ongoing 02/19/24  PLAN: PT FREQUENCY: 1-3 times per week   PT DURATION: 8-12 weeks  PLANNED INTERVENTIONS (unless contraindicated):  97110-Therapeutic exercises, 97530- Therapeutic activity, [id number]- Neuromuscular re-education, 97535- Self Care, 02859- Manual therapy, G0283- Electrical stimulation (unattended), 97016- Vasopneumatic device,  and 02964- Ultrasound  PLAN FOR NEXT SESSION:  What did MD say at follow up Progress ROM as tolerated No lifting >5 lbs No IR/backwards extension  NEXT MD VISIT: 02/19/24  Redell JONELLE Moose, PT,DPT 02/19/2024, 10:08 AM

## 2024-02-19 NOTE — Telephone Encounter (Signed)
 2 fills per 60 days  I could not pull up on cover my meds Pharmacy said they started they did not Messaged patient for the pharmacy benefit information

## 2024-02-22 ENCOUNTER — Ambulatory Visit: Attending: Orthopedic Surgery | Admitting: Physical Therapy

## 2024-02-22 ENCOUNTER — Encounter: Payer: Self-pay | Admitting: Physical Therapy

## 2024-02-22 DIAGNOSIS — M6281 Muscle weakness (generalized): Secondary | ICD-10-CM | POA: Diagnosis not present

## 2024-02-22 DIAGNOSIS — M25511 Pain in right shoulder: Secondary | ICD-10-CM | POA: Diagnosis not present

## 2024-02-22 NOTE — Therapy (Signed)
 OUTPATIENT PHYSICAL THERAPY TREATMENT   Patient Name: Belinda Turner MRN: [id number] DOB:1965/12/08, 58 y.o., female Today's Date: 02/22/2024  END OF SESSION:  PT End of Session - 02/22/24 0809     Visit Number 6    Number of Visits 15    Date for PT Re-Evaluation 04/25/24    PT Start Time 0755    PT Stop Time 0833    PT Time Calculation (min) 38 min    Activity Tolerance Patient tolerated treatment well    Behavior During Therapy [facility] for tasks assessed/performed           Past Medical History:  Diagnosis Date   Acute non-recurrent ethmoidal sinusitis 05/10/2023   Asthma    Bipolar 1 disorder (HCC)    Depression    Hypertension    Migraine    Nasal congestion 05/10/2023   Sore throat 05/10/2023   Stroke (HCC) 2016   2 strokes  A little weaker on right side and 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   Thyroid  disease    Past Surgical History:  Procedure Laterality Date   CESAREAN SECTION  2005   CESAREAN SECTION  2006   CHOLECYSTECTOMY  2014   COLONOSCOPY WITH PROPOFOL  N/A 03/01/2020   Procedure: COLONOSCOPY WITH PROPOFOL ;  Surgeon: Shaaron Lamar HERO, MD;  Location: [facility];  Service: Endoscopy;  Laterality: N/A;  2:00pm   THYROIDECTOMY  2013   TOTAL ABDOMINAL HYSTERECTOMY  2010   TOTAL SHOULDER ARTHROPLASTY Right 01/21/2024   Procedure: ARTHROPLASTY, SHOULDER, TOTAL;  Surgeon: Onesimo Oneil LABOR, MD;  Location: [facility];  Service: Orthopedics;  Laterality: Right;   Patient Active Problem List   Diagnosis Date Noted   Arthritis of right glenohumeral joint 01/21/2024   Morbid obesity (HCC) 02/01/2023   Unilateral primary osteoarthritis, right knee 03/21/2022   History of stroke 03/31/2021   Other fatigue 05/10/2020   Paresthesia 05/10/2020   Primary hypertension 03/24/2020   Bipolar 1 disorder (HCC)    Depression    Asthma    Postoperative hypothyroidism 06/26/2014    PCP: Joesph Annabella HERO, FNP   REFERRING PROVIDER: Joesph Annabella HERO, FNP   REFERRING  DIAG: [phone] (ICD-10-CM) - Status post total shoulder arthroplasty, right   Rationale for Evaluation and Treatment:  Rehabiliation  THERAPY DIAG:  Acute pain of right shoulder  Muscle weakness (generalized)  ONSET DATE: DOS 01/21/24   SUBJECTIVE:                                                                                                                                                                                           SUBJECTIVE STATEMENT:She  relays she was able to get her pain medicine refilled and this has helped with her overall pain. She had run out last week.  PERTINENT HISTORY:  See above PMH  PAIN:  NPRS scale: 6/10 Pain location:right shoulder front and back Pain description: constant, sharp, burning Aggravating factors: touching it, moving it Relieving factors: recliner and resting it on pillow. , She does ice but says it does not help.   PRECAUTIONS: ,  Shoulder post op  RED FLAGS: None   WEIGHT BEARING RESTRICTIONS:  No  FALLS:  Has patient fallen in last 6 months? No   OCCUPATION:  Logistics, computer work   PLOF:  Independent  PATIENT GOALS:  Get arm back to normal  OBJECTIVE:  Note: Objective measures were completed at Evaluation unless otherwise noted.  PATIENT SURVEYS:  Patient-Specific Activity Scoring Scheme  0 represents "unable to perform." 10 represents "able to perform at prior level. 0 1 2 3 4 5 6 7 8 9  10 (Date and Score)   Activity Eval     1. Lifting with right arm  0    2. bathing 3    3. Household chores 2   4.    5.    Score 1.66/10    Total score = sum of the activity scores/number of activities Minimum detectable change (90%CI) for average score = 2 points Minimum detectable change (90%CI) for single activity score = 3 points   Shoulder   Hand dominance: Left  UPPER EXTREMITY ROM:  Passive ROM Right eval Right 02/19/24  Shoulder flexion 70 90  Shoulder extension    Shoulder abduction 50 80   Shoulder adduction    Shoulder extension    Shoulder internal rotation    Shoulder external rotation 20 40  Elbow flexion    Elbow extension    Wrist flexion    Wrist extension    Wrist ulnar deviation    Wrist radial deviation    Wrist pronation    Wrist supination     (Blank rows = not tested)   UPPER EXTREMITY MMT:  MMT Right eval Left eval  Shoulder flexion 1   Shoulder extension    Shoulder abduction 1   Shoulder adduction    Shoulder extension    Shoulder internal rotation 3   Shoulder external rotation 2   Middle trapezius    Lower trapezius    Elbow flexion    Elbow extension    Wrist flexion    Wrist extension    Wrist ulnar deviation    Wrist radial deviation    Wrist pronation    Wrist supination    Grip strength     (Blank rows = not tested)                                                                                                                               TREATMENT DATE:  02/22/24 Seated shoulder circles X 10 anterior  and posterior Seated elbow AROM X 15 reps Seated scapular retraction X 15 reps HEP additions with print out provided and education provided  Theractivity (to improve reaching) Seated shoulder flexion AAROM with hands clasped 2X10 Supine shoulder flexion AAROM with wand X 10 reps Supine shoulder abduction AAROM with wand X 10 reps Supine shoulder ER AAROM with wand X 10 reps  02/13/24 Seated shoulder circles X 10 anterior and posterior Seated elbow AROM X 15 reps Seated scapular retraction X 15 reps Seated UE ranger AAROM X 10 reps flexion, circles CW,CCW, scaption Standing shoulder pulleys PROM 2X15 flexion Estim X 10 min to left shoulder IFC, IFC with intensity turned up to tolerance.     PATIENT EDUCATION: Education details: HEP, PT plan of care, selfcare Person educated: Patient Education method: Explanation, Demonstration, Verbal cues, and Handouts Education comprehension: verbalized understanding, further  education recommended   HOME EXERCISE PROGRAM: Access Code: [email or web address] URL: [email or web address] Date: 02/22/2024 Prepared by: Redell Moose  Exercises - Seated Scapular Retraction  - 3 x daily - 6 x weekly - 1 sets - 10 reps - Circular Shoulder Pendulum with Table Support  - 3 x daily - 6 x weekly - 1 sets - 15 reps - Seated Shoulder Flexion Towel Slide at Table Top  - 3 x daily - 6 x weekly - 1 sets - 10 reps - Seated Shoulder Abduction Towel Slide at Table Top (Mirrored)  - 3 x daily - 6 x weekly - 1 sets - 10 reps - 5 hold - Seated Shoulder External Rotation PROM on Table (Mirrored)  - 2-3 x daily - 6 x weekly - 1 sets - 10 reps - 5 hold - Supine Shoulder Flexion Extension AAROM with Dowel  - 2 x daily - 6 x weekly - 1 sets - 10 reps - Supine Shoulder External Rotation in 45 Degrees Abduction AAROM with Dowel (Mirrored)  - 2 x daily - 6 x weekly - 1 sets - 10 reps - Supine Shoulder Abduction AAROM with Dowel (Mirrored)  - 2 x daily - 6 x weekly - 1 sets - 10 reps - Seated AAROM Shoulder Flexion  - 2 x daily - 6 x weekly - 2 sets - 10 reps  ASSESSMENT:  CLINICAL IMPRESSION: Pain was better managed with medication today allowing us  to improve exercise tolerance some in PT. I did add some additional exercises into her HEP today to help her with reaching ability. She shows good understanding of these and good effort with PT.   OBJECTIVE IMPAIRMENTS: decreased activity tolerance for ADL's,, decreased endurance, decreased mobility, decreased ROM, decreased strength, impaired flexibility, impaired UE use, and pain.  ACTIVITY LIMITATIONSlifting, carry, reaching, cleaning,  driving, and or occupation  PERSONAL FACTORS: see above PMH  also affecting patient's functional outcome.  REHAB POTENTIAL: Good  CLINICAL DECISION MAKING: Stable/uncomplicated  EVALUATION COMPLEXITY: Low    GOALS: Short term PT Goals Target date: 02/29/2024   Pt will be I and compliant with  HEP. Baseline:  Goal status: MET 02/11/24 Pt will decrease pain by 25% overall with arm movements Baseline: Goal status: ongoing 02/19/24  Long term PT goals Target date:04/25/2024   Pt will improve Rt sholder AROM to [facility] to improve functional mobility Baseline: Goal status: ongoing 02/19/24 Pt will improve  strength to at least 4+/5 MMT to improve functional strength Baseline: Goal status: ongoing 02/19/24 Pt will improve Patient specific functional scale (PSFS) to at least 7/10 to show improved function level Baseline: Goal status: ongoing 02/19/24  Pt will reduce pain to overall less than 3/10 with usual activity and work activity. Baseline: Goal status: ongoing 02/19/24  PLAN: PT FREQUENCY: 1-3 times per week   PT DURATION: 8-12 weeks  PLANNED INTERVENTIONS (unless contraindicated):  97110-Therapeutic exercises, 97530- Therapeutic activity, [id number]- Neuromuscular re-education, 97535- Self Care, 02859- Manual therapy, G0283- Electrical stimulation (unattended), 97016- Vasopneumatic device, and 97035- Ultrasound  PLAN FOR NEXT SESSION:  Progress ROM as tolerated No lifting >5 lbs No IR/backwards extension  NEXT MD VISIT: 02/19/24  Redell JONELLE Moose, PT,DPT 02/22/2024, 9:06 AM

## 2024-02-29 ENCOUNTER — Other Ambulatory Visit: Payer: Self-pay | Admitting: *Deleted

## 2024-02-29 ENCOUNTER — Ambulatory Visit: Admitting: Physical Therapy

## 2024-02-29 ENCOUNTER — Encounter: Payer: Self-pay | Admitting: Physical Therapy

## 2024-02-29 DIAGNOSIS — M25511 Pain in right shoulder: Secondary | ICD-10-CM | POA: Diagnosis not present

## 2024-02-29 DIAGNOSIS — Z8673 Personal history of transient ischemic attack (TIA), and cerebral infarction without residual deficits: Secondary | ICD-10-CM

## 2024-02-29 DIAGNOSIS — M6281 Muscle weakness (generalized): Secondary | ICD-10-CM

## 2024-02-29 MED ORDER — ROSUVASTATIN CALCIUM 5 MG PO TABS: 5.0000 mg | ORAL_TABLET | Freq: Every day | ORAL | 0 refills | Status: DC

## 2024-02-29 NOTE — Therapy (Signed)
 OUTPATIENT PHYSICAL THERAPY TREATMENT   Patient Name: Belinda Turner MRN: [id number] DOB:[birth year], 58 y.o., female Today's Date: 02/29/2024  END OF SESSION:  PT End of Session - 02/29/24 1005     Visit Number 7    Number of Visits 15    Date for PT Re-Evaluation 04/25/24    PT Start Time 0828    PT Stop Time 0906    PT Time Calculation (min) 38 min    Activity Tolerance Patient tolerated treatment well    Behavior During Therapy [facility] for tasks assessed/performed           Past Medical History:  Diagnosis Date   Acute non-recurrent ethmoidal sinusitis 05/10/2023   Asthma    Bipolar 1 disorder (HCC)    Depression    Hypertension    Migraine    Nasal congestion 05/10/2023   Sore throat 05/10/2023   Stroke (HCC) 2016   2 strokes  A little weaker on right side and 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   Thyroid  disease    Past Surgical History:  Procedure Laterality Date   CESAREAN SECTION  2005   CESAREAN SECTION  2006   CHOLECYSTECTOMY  2014   COLONOSCOPY WITH PROPOFOL  N/A 03/01/2020   Procedure: COLONOSCOPY WITH PROPOFOL ;  Surgeon: Shaaron Lamar HERO, MD;  [facility];  Service: Endoscopy;  Laterality: N/A;  2:00pm   THYROIDECTOMY  2013   TOTAL ABDOMINAL HYSTERECTOMY  2010   TOTAL SHOULDER ARTHROPLASTY Right 01/21/2024   Procedure: ARTHROPLASTY, SHOULDER, TOTAL;  Surgeon: Onesimo Oneil LABOR, MD;  Location: [facility];  Service: Orthopedics;  Laterality: Right;   Patient Active Problem List   Diagnosis Date Noted   Arthritis of right glenohumeral joint 01/21/2024   Morbid obesity (HCC) 02/01/2023   Unilateral primary osteoarthritis, right knee 03/21/2022   History of stroke 03/31/2021   Other fatigue 05/10/2020   Paresthesia 05/10/2020   Primary hypertension 03/24/2020   Bipolar 1 disorder (HCC)    Depression    Asthma    Postoperative hypothyroidism 06/26/2014    PCP: Joesph Annabella HERO, FNP   REFERRING PROVIDER: Joesph Annabella HERO, FNP   REFERRING  DIAG: [phone] (ICD-10-CM) - Status post total shoulder arthroplasty, right   Rationale for Evaluation and Treatment:  Rehabiliation  THERAPY DIAG:  Acute pain of right shoulder  Muscle weakness (generalized)  ONSET DATE: DOS 01/21/24   SUBJECTIVE:                                                                                                                                                                                           SUBJECTIVE STATEMENT:She  continues to report high pain levels and difficulty raising her arm.  PERTINENT HISTORY:  See above PMH  PAIN:  NPRS scale: 8/10 Pain location:right shoulder front and back Pain description: constant, sharp, burning Aggravating factors: touching it, moving it Relieving factors: recliner and resting it on pillow. , She does ice but says it does not help.   PRECAUTIONS: ,  Shoulder post op  RED FLAGS: None   WEIGHT BEARING RESTRICTIONS:  No  FALLS:  Has patient fallen in last 6 months? No   OCCUPATION:  Logistics, computer work   PLOF:  Independent  PATIENT GOALS:  Get arm back to normal  OBJECTIVE:  Note: Objective measures were completed at Evaluation unless otherwise noted.  PATIENT SURVEYS:  Patient-Specific Activity Scoring Scheme  0 represents "unable to perform." 10 represents "able to perform at prior level. 0 1 2 3 4 5 6 7 8 9  10 (Date and Score)   Activity Eval     1. Lifting with right arm  0    2. bathing 3    3. Household chores 2   4.    5.    Score 1.66/10    Total score = sum of the activity scores/number of activities Minimum detectable change (90%CI) for average score = 2 points Minimum detectable change (90%CI) for single activity score = 3 points   Shoulder   Hand dominance: Left  UPPER EXTREMITY ROM:  Passive ROM Right eval Right 02/19/24  Shoulder flexion 70 90  Shoulder extension    Shoulder abduction 50 80  Shoulder adduction    Shoulder extension     Shoulder internal rotation    Shoulder external rotation 20 40  Elbow flexion    Elbow extension    Wrist flexion    Wrist extension    Wrist ulnar deviation    Wrist radial deviation    Wrist pronation    Wrist supination     (Blank rows = not tested)   UPPER EXTREMITY MMT:  MMT Right eval Right   Shoulder flexion 1   Shoulder extension    Shoulder abduction 1   Shoulder adduction    Shoulder extension    Shoulder internal rotation 3   Shoulder external rotation 2   Middle trapezius    Lower trapezius    Elbow flexion    Elbow extension    Wrist flexion    Wrist extension    Wrist ulnar deviation    Wrist radial deviation    Wrist pronation    Wrist supination    Grip strength     (Blank rows = not tested)                                                                                                                               TREATMENT DATE: 02/29/24 Manual: Rt shouder PROM to tolerance Therex: Supine wand flexion AAROM X 10 Supine wand abduction AAROM X 10 Supine  wand ER AAROM X 10 Supine AAROM flexion hands clasped.  02/22/24 Seated shoulder circles X 10 anterior and posterior Seated elbow AROM X 15 reps Seated scapular retraction X 15 reps HEP additions with print out provided and education provided  Theractivity (to improve reaching) Seated shoulder flexion AAROM with hands clasped 2X10 Supine shoulder flexion AAROM with wand X 10 reps Supine shoulder abduction AAROM with wand X 10 reps Supine shoulder ER AAROM with wand X 10 reps      PATIENT EDUCATION: Education details: HEP, PT plan of care, selfcare Person educated: Patient Education method: Explanation, Demonstration, Verbal cues, and Handouts Education comprehension: verbalized understanding, further education recommended   HOME EXERCISE PROGRAM: Access Code: [email or web address] URL: [email or web address] Date: 02/22/2024 Prepared by: Redell Moose  Exercises - Seated  Scapular Retraction  - 3 x daily - 6 x weekly - 1 sets - 10 reps - Circular Shoulder Pendulum with Table Support  - 3 x daily - 6 x weekly - 1 sets - 15 reps - Seated Shoulder Flexion Towel Slide at Table Top  - 3 x daily - 6 x weekly - 1 sets - 10 reps - Seated Shoulder Abduction Towel Slide at Table Top (Mirrored)  - 3 x daily - 6 x weekly - 1 sets - 10 reps - 5 hold - Seated Shoulder External Rotation PROM on Table (Mirrored)  - 2-3 x daily - 6 x weekly - 1 sets - 10 reps - 5 hold - Supine Shoulder Flexion Extension AAROM with Dowel  - 2 x daily - 6 x weekly - 1 sets - 10 reps - Supine Shoulder External Rotation in 45 Degrees Abduction AAROM with Dowel (Mirrored)  - 2 x daily - 6 x weekly - 1 sets - 10 reps - Supine Shoulder Abduction AAROM with Dowel (Mirrored)  - 2 x daily - 6 x weekly - 1 sets - 10 reps - Seated AAROM Shoulder Flexion  - 2 x daily - 6 x weekly - 2 sets - 10 reps  ASSESSMENT:  CLINICAL IMPRESSION: She is progressing slowly but was able to lift her arm higher today with AAROM exercises. Although this is still painful for her. PT recommending to continue progress as tolerated to improve functional use of her arm.  OBJECTIVE IMPAIRMENTS: decreased activity tolerance for ADL's,, decreased endurance, decreased mobility, decreased ROM, decreased strength, impaired flexibility, impaired UE use, and pain.  ACTIVITY LIMITATIONSlifting, carry, reaching, cleaning,  driving, and or occupation  PERSONAL FACTORS: see above PMH  also affecting patient's functional outcome.  REHAB POTENTIAL: Good  CLINICAL DECISION MAKING: Stable/uncomplicated  EVALUATION COMPLEXITY: Low    GOALS: Short term PT Goals Target date: 02/29/2024   Pt will be I and compliant with HEP. Baseline:  Goal status: MET 02/11/24 Pt will decrease pain by 25% overall with arm movements Baseline: Goal status: ongoing 02/19/24  Long term PT goals Target date:04/25/2024   Pt will improve Rt sholder AROM to  [facility] [place] to improve functional mobility Baseline: Goal status: ongoing 02/19/24 Pt will improve  strength to at least 4+/5 MMT to improve functional strength Baseline: Goal status: ongoing 02/19/24 Pt will improve Patient specific functional scale (PSFS) to at least 7/10 to show improved function level Baseline: Goal status: ongoing 02/19/24 Pt will reduce pain to overall less than 3/10 with usual activity and work activity. Baseline: Goal status: ongoing 02/19/24  PLAN: PT FREQUENCY: 1-3 times per week   PT DURATION: 8-12 weeks  PLANNED INTERVENTIONS (unless contraindicated):  97110-Therapeutic exercises,  02469- Therapeutic activity, [id number]- Neuromuscular re-education, [id number]- Self Care, [phone]- Manual therapy, G0283- Electrical stimulation (unattended), 97016- Vasopneumatic device, and [id number]- Ultrasound  PLAN FOR NEXT SESSION:  Progress ROM as tolerated No lifting >5 lbs No IR/backwards extension  NEXT MD VISIT: 02/19/24  Redell JONELLE Moose, PT,DPT 02/29/2024, 10:13 AM

## 2024-03-03 ENCOUNTER — Ambulatory Visit: Admitting: Physical Therapy

## 2024-03-03 ENCOUNTER — Encounter: Payer: Self-pay | Admitting: Physical Therapy

## 2024-03-03 DIAGNOSIS — M25511 Pain in right shoulder: Secondary | ICD-10-CM

## 2024-03-03 DIAGNOSIS — M6281 Muscle weakness (generalized): Secondary | ICD-10-CM | POA: Diagnosis not present

## 2024-03-03 NOTE — Therapy (Signed)
 OUTPATIENT PHYSICAL THERAPY TREATMENT   Patient Name: Belinda Turner MRN: [id number] DOB:30-Dec-1965, 58 y.o., female Today's Date: 03/04/2024  END OF SESSION:  PT End of Session - 03/03/24 1548     Visit Number 8    Number of Visits 15    Date for PT Re-Evaluation 04/25/24    PT Start Time 1545    PT Stop Time 1623    PT Time Calculation (min) 38 min    Activity Tolerance Patient tolerated treatment well    Behavior During Therapy [facility] for tasks assessed/performed            Past Medical History:  Diagnosis Date   Acute non-recurrent ethmoidal sinusitis 05/10/2023   Asthma    Bipolar 1 disorder (HCC)    Depression    Hypertension    Migraine    Nasal congestion 05/10/2023   Sore throat 05/10/2023   Stroke (HCC) 2016   2 strokes  A little weaker on right side and 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   Thyroid  disease    Past Surgical History:  Procedure Laterality Date   CESAREAN SECTION  2005   CESAREAN SECTION  2006   CHOLECYSTECTOMY  2014   COLONOSCOPY WITH PROPOFOL  N/A 03/01/2020   Procedure: COLONOSCOPY WITH PROPOFOL ;  Surgeon: Shaaron Lamar HERO, MD;  Location: [facility];  Service: Endoscopy;  Laterality: N/A;  2:00pm   THYROIDECTOMY  2013   TOTAL ABDOMINAL HYSTERECTOMY  2010   TOTAL SHOULDER ARTHROPLASTY Right 01/21/2024   Procedure: ARTHROPLASTY, SHOULDER, TOTAL;  Surgeon: Onesimo Oneil LABOR, MD;  Location: [facility];  Service: Orthopedics;  Laterality: Right;   Patient Active Problem List   Diagnosis Date Noted   Arthritis of right glenohumeral joint 01/21/2024   Morbid obesity (HCC) 02/01/2023   Unilateral primary osteoarthritis, right knee 03/21/2022   History of stroke 03/31/2021   Other fatigue 05/10/2020   Paresthesia 05/10/2020   Primary hypertension 03/24/2020   Bipolar 1 disorder (HCC)    Depression    Asthma    Postoperative hypothyroidism 06/26/2014    PCP: Joesph Annabella HERO, FNP   REFERRING PROVIDER: Joesph Annabella HERO,  FNP   REFERRING DIAG: [phone] (ICD-10-CM) - Status post total shoulder arthroplasty, right   Rationale for Evaluation and Treatment:  Rehabiliation  THERAPY DIAG:  Acute pain of right shoulder  Muscle weakness (generalized)  ONSET DATE: DOS 01/21/24   SUBJECTIVE:                                                                                                                                                                                           SUBJECTIVE  STATEMENT:She continues to report high pain levels and difficulty raising her arm.  PERTINENT HISTORY:  See above PMH  PAIN:  NPRS scale: 8/10 Pain location:right shoulder front and back Pain description: constant, sharp, burning Aggravating factors: touching it, moving it Relieving factors: recliner and resting it on pillow. , She does ice but says it does not help.   PRECAUTIONS: ,  Shoulder post op  RED FLAGS: None   WEIGHT BEARING RESTRICTIONS:  No  FALLS:  Has patient fallen in last 6 months? No   OCCUPATION:  Logistics, computer work   PLOF:  Independent  PATIENT GOALS:  Get arm back to normal  OBJECTIVE:  Note: Objective measures were completed at Evaluation unless otherwise noted.  PATIENT SURVEYS:  Patient-Specific Activity Scoring Scheme  0 represents "unable to perform." 10 represents "able to perform at prior level. 0 1 2 3 4 5 6 7 8 9  10 (Date and Score)   Activity Eval     1. Lifting with right arm  0    2. bathing 3    3. Household chores 2   4.    5.    Score 1.66/10    Total score = sum of the activity scores/number of activities Minimum detectable change (90%CI) for average score = 2 points Minimum detectable change (90%CI) for single activity score = 3 points   Shoulder   Hand dominance: Left  UPPER EXTREMITY ROM:  Passive ROM Right eval Right 02/19/24  Shoulder flexion 70 90  Shoulder extension    Shoulder abduction 50 80  Shoulder adduction    Shoulder  extension    Shoulder internal rotation    Shoulder external rotation 20 40  Elbow flexion    Elbow extension    Wrist flexion    Wrist extension    Wrist ulnar deviation    Wrist radial deviation    Wrist pronation    Wrist supination     (Blank rows = not tested)   UPPER EXTREMITY MMT:  MMT Right eval Right   Shoulder flexion 1   Shoulder extension    Shoulder abduction 1   Shoulder adduction    Shoulder extension    Shoulder internal rotation 3   Shoulder external rotation 2   Middle trapezius    Lower trapezius    Elbow flexion    Elbow extension    Wrist flexion    Wrist extension    Wrist ulnar deviation    Wrist radial deviation    Wrist pronation    Wrist supination    Grip strength     (Blank rows = not tested)                                                                                                                               TREATMENT DATE: 03/03/24 Therex: Supine wand flexion AAROM X 10 Supine wand abduction AAROM X 10 Supine wand ER AAROM X 10  Supine AAROM flexion hands clasped X 10 Supine chest press X 5 reps Supine scapular retractions X 10 Standing isometric shoulder ER 5 sec X 10 Standing isometric shoulder IR 5 sec X 10 Standing isometric shoulder flexion 5 sec X 10   PATIENT EDUCATION: Education details: HEP, PT plan of care, selfcare Person educated: Patient Education method: Explanation, Demonstration, Verbal cues, and Handouts Education comprehension: verbalized understanding, further education recommended   HOME EXERCISE PROGRAM: Access Code: [email or web address] URL: [email or web address] Date: 02/22/2024 Prepared by: Redell Moose  Exercises - Seated Scapular Retraction  - 3 x daily - 6 x weekly - 1 sets - 10 reps - Circular Shoulder Pendulum with Table Support  - 3 x daily - 6 x weekly - 1 sets - 15 reps - Seated Shoulder Flexion Towel Slide at Table Top  - 3 x daily - 6 x weekly - 1 sets - 10 reps - Seated  Shoulder Abduction Towel Slide at Table Top (Mirrored)  - 3 x daily - 6 x weekly - 1 sets - 10 reps - 5 hold - Seated Shoulder External Rotation PROM on Table (Mirrored)  - 2-3 x daily - 6 x weekly - 1 sets - 10 reps - 5 hold - Supine Shoulder Flexion Extension AAROM with Dowel  - 2 x daily - 6 x weekly - 1 sets - 10 reps - Supine Shoulder External Rotation in 45 Degrees Abduction AAROM with Dowel (Mirrored)  - 2 x daily - 6 x weekly - 1 sets - 10 reps - Supine Shoulder Abduction AAROM with Dowel (Mirrored)  - 2 x daily - 6 x weekly - 1 sets - 10 reps - Seated AAROM Shoulder Flexion  - 2 x daily - 6 x weekly - 2 sets - 10 reps  ASSESSMENT:  CLINICAL IMPRESSION: Her ROM is looking good in gravity reduced positions but she still lacks the strength needed to raise arm up against full gravity when vertical such as with sitting or standing. I did give her isometrics to add to try to build up her deltoids more to allow to achieve this.   OBJECTIVE IMPAIRMENTS: decreased activity tolerance for ADL's,, decreased endurance, decreased mobility, decreased ROM, decreased strength, impaired flexibility, impaired UE use, and pain.  ACTIVITY LIMITATIONSlifting, carry, reaching, cleaning,  driving, and or occupation  PERSONAL FACTORS: see above PMH  also affecting patient's functional outcome.  REHAB POTENTIAL: Good  CLINICAL DECISION MAKING: Stable/uncomplicated  EVALUATION COMPLEXITY: Low    GOALS: Short term PT Goals Target date: 02/29/2024   Pt will be I and compliant with HEP. Baseline:  Goal status: MET 02/11/24 Pt will decrease pain by 25% overall with arm movements Baseline: Goal status: ongoing 02/19/24  Long term PT goals Target date:04/25/2024   Pt will improve Rt sholder AROM to [facility] to improve functional mobility Baseline: Goal status: ongoing 02/19/24 Pt will improve  strength to at least 4+/5 MMT to improve functional strength Baseline: Goal status: ongoing 02/19/24 Pt will  improve Patient specific functional scale (PSFS) to at least 7/10 to show improved function level Baseline: Goal status: ongoing 02/19/24 Pt will reduce pain to overall less than 3/10 with usual activity and work activity. Baseline: Goal status: ongoing 02/19/24  PLAN: PT FREQUENCY: 1-3 times per week   PT DURATION: 8-12 weeks  PLANNED INTERVENTIONS (unless contraindicated):  97110-Therapeutic exercises, 97530- Therapeutic activity, [id number]- Neuromuscular re-education, 97535- Self Care, 02859- Manual therapy, G0283- Electrical stimulation (unattended), 97016- Vasopneumatic device, and 02964- Ultrasound  PLAN FOR NEXT SESSION:  Progress ROM as tolerated No lifting >5 lbs No IR/backwards extension  NEXT MD VISIT: 03/10/24  Redell JONELLE Moose, PT,DPT 03/04/2024, 8:24 AM

## 2024-03-04 ENCOUNTER — Encounter: Admitting: Orthopedic Surgery

## 2024-03-05 ENCOUNTER — Ambulatory Visit: Admitting: Physical Therapy

## 2024-03-08 MED ORDER — OXYCODONE HCL 5 MG PO TABS: 5.0000 mg | ORAL_TABLET | ORAL | 0 refills | Status: DC | PRN

## 2024-03-08 NOTE — Addendum Note (Signed)
 Addended by: ONESIMO ANES A on: 03/08/2024 10:05 PM   Modules accepted: Orders

## 2024-03-10 ENCOUNTER — Encounter: Payer: Self-pay | Admitting: Physical Therapy

## 2024-03-10 ENCOUNTER — Ambulatory Visit (INDEPENDENT_AMBULATORY_CARE_PROVIDER_SITE_OTHER): Admitting: Orthopedic Surgery

## 2024-03-10 ENCOUNTER — Ambulatory Visit: Admitting: Physical Therapy

## 2024-03-10 ENCOUNTER — Ambulatory Visit (INDEPENDENT_AMBULATORY_CARE_PROVIDER_SITE_OTHER)

## 2024-03-10 DIAGNOSIS — M25511 Pain in right shoulder: Secondary | ICD-10-CM

## 2024-03-10 DIAGNOSIS — M6281 Muscle weakness (generalized): Secondary | ICD-10-CM

## 2024-03-10 DIAGNOSIS — M19011 Primary osteoarthritis, right shoulder: Secondary | ICD-10-CM

## 2024-03-10 DIAGNOSIS — Z96611 Presence of right artificial shoulder joint: Secondary | ICD-10-CM

## 2024-03-10 DIAGNOSIS — G8918 Other acute postprocedural pain: Secondary | ICD-10-CM

## 2024-03-10 MED ORDER — OXYCODONE HCL 5 MG PO TABS: 5.0000 mg | ORAL_TABLET | ORAL | 0 refills | Status: AC | PRN

## 2024-03-10 NOTE — Progress Notes (Signed)
   There were no vitals taken for this visit.  There is no height or weight on file to calculate BMI.  Chief Complaint  Patient presents with   Routine Post Op    TSA DOS 01/21/24    Encounter Diagnoses  Name Primary?   Status post total shoulder arthroplasty, right 01/21/24 Yes   Glenohumeral arthritis, right     DOI/DOS/ 01/21/24  Unchanged- swollen was hoping to get her pain medicine problem resolved today  The swelling is in the front of the shoulder and there is some tenderness there as in the chest wall there is some swelling laterally but this looks to be very minimal.  The wound looks fine   PT notes indicate progressively increasing range of motion external rotation forward elevation and abduction  Recommend continue physical therapy twice a week for the next 3 weeks and then it can be reassessed whether she needs more  Meds ordered this encounter  Medications   oxyCODONE  (ROXICODONE ) 5 MG immediate release tablet    Sig: Take 1 tablet (5 mg total) by mouth every 4 (four) hours as needed for up to 5 days.    Dispense:  30 tablet    Refill:  0   '

## 2024-03-10 NOTE — Therapy (Signed)
 OUTPATIENT PHYSICAL THERAPY TREATMENT   Patient Name: Belinda Turner MRN: [id number] DOB:06/15/1966, 58 y.o., female Today's Date: 03/10/2024  END OF SESSION:  PT End of Session - 03/10/24 0947     Visit Number 9    Number of Visits 15    Date for PT Re-Evaluation 04/25/24    PT Start Time 0928    PT Stop Time 1006    PT Time Calculation (min) 38 min    Activity Tolerance Patient tolerated treatment well    Behavior During Therapy [facility] for tasks assessed/performed            Past Medical History:  Diagnosis Date   Acute non-recurrent ethmoidal sinusitis 05/10/2023   Asthma    Bipolar 1 disorder (HCC)    Depression    Hypertension    Migraine    Nasal congestion 05/10/2023   Sore throat 05/10/2023   Stroke (HCC) 2016   2 strokes  A little weaker on right side and 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   Thyroid  disease    Past Surgical History:  Procedure Laterality Date   CESAREAN SECTION  2005   CESAREAN SECTION  2006   CHOLECYSTECTOMY  2014   COLONOSCOPY WITH PROPOFOL  N/A 03/01/2020   Procedure: COLONOSCOPY WITH PROPOFOL ;  Surgeon: Shaaron Lamar HERO, MD;  Location: [facility];  Service: Endoscopy;  Laterality: N/A;  2:00pm   THYROIDECTOMY  2013   TOTAL ABDOMINAL HYSTERECTOMY  2010   TOTAL SHOULDER ARTHROPLASTY Right 01/21/2024   Procedure: ARTHROPLASTY, SHOULDER, TOTAL;  Surgeon: Onesimo Oneil LABOR, MD;  Location: [facility];  Service: Orthopedics;  Laterality: Right;   Patient Active Problem List   Diagnosis Date Noted   Arthritis of right glenohumeral joint 01/21/2024   Morbid obesity (HCC) 02/01/2023   Unilateral primary osteoarthritis, right knee 03/21/2022   History of stroke 03/31/2021   Other fatigue 05/10/2020   Paresthesia 05/10/2020   Primary hypertension 03/24/2020   Bipolar 1 disorder (HCC)    Depression    Asthma    Postoperative hypothyroidism 06/26/2014    PCP: Joesph Annabella HERO, FNP   REFERRING PROVIDER: Joesph Annabella HERO,  FNP   REFERRING DIAG: [phone] (ICD-10-CM) - Status post total shoulder arthroplasty, right   Rationale for Evaluation and Treatment:  Rehabiliation  THERAPY DIAG:  Acute pain of right shoulder  Muscle weakness (generalized)  ONSET DATE: DOS 01/21/24   SUBJECTIVE:                                                                                                                                                                                           SUBJECTIVE  STATEMENT:She has been in a lot pain, states her pain medicine has run out and has not had any in 2 weeks but will see MD today.  PERTINENT HISTORY:  See above PMH  PAIN:  NPRS scale: 8/10 Pain location:right shoulder front and back Pain description: constant, sharp, burning Aggravating factors: touching it, moving it Relieving factors: recliner and resting it on pillow. , She does ice but says it does not help.   PRECAUTIONS: ,  Shoulder post op  RED FLAGS: None   WEIGHT BEARING RESTRICTIONS:  No  FALLS:  Has patient fallen in last 6 months? No   OCCUPATION:  Logistics, computer work   PLOF:  Independent  PATIENT GOALS:  Get arm back to normal  OBJECTIVE:  Note: Objective measures were completed at Evaluation unless otherwise noted.  PATIENT SURVEYS:  Patient-Specific Activity Scoring Scheme  0 represents "unable to perform." 10 represents "able to perform at prior level. 0 1 2 3 4 5 6 7 8 9  10 (Date and Score)   Activity Eval     1. Lifting with right arm  0    2. bathing 3    3. Household chores 2   4.    5.    Score 1.66/10    Total score = sum of the activity scores/number of activities Minimum detectable change (90%CI) for average score = 2 points Minimum detectable change (90%CI) for single activity score = 3 points   Shoulder   Hand dominance: Left  UPPER EXTREMITY ROM:  Passive ROM Right eval Right 02/19/24 Right 03/10/24  Shoulder flexion 70 90 100  Shoulder extension      Shoulder abduction 50 80 90  Shoulder adduction     Shoulder extension     Shoulder internal rotation     Shoulder external rotation 20 40 50  Elbow flexion     Elbow extension     Wrist flexion     Wrist extension     Wrist ulnar deviation     Wrist radial deviation     Wrist pronation     Wrist supination      (Blank rows = not tested)   UPPER EXTREMITY MMT:  MMT Right eval Right 03/10/24  Shoulder flexion 1 2  Shoulder extension    Shoulder abduction 1 2  Shoulder adduction    Shoulder extension    Shoulder internal rotation 3 3  Shoulder external rotation 2 3  Middle trapezius    Lower trapezius    Elbow flexion    Elbow extension    Wrist flexion    Wrist extension    Wrist ulnar deviation    Wrist radial deviation    Wrist pronation    Wrist supination    Grip strength     (Blank rows = not tested)  TREATMENT DATE: 03/10/24 Therex: Seated elbow flexion 2# 2X10 reps Seated shoulder flexion AROM X 5 reps Seated shoulder abduction AROM X 5 reps Seated shoulder flexion AAROM hands clasped 2X5 reps Seated shoulder flexion AAROM pball roll outs on mat table X 10 reps Seated shoulder abduction AAROM pball roll outs on mat table X 10 reps Seated shoulder ER AAROM pball roll outs on mat table X 10 reps Standing rows yellow X 10 reps Standing shoulder extensions yellow X 10  03/03/24 Therex: Supine wand flexion AAROM X 10 Supine wand abduction AAROM X 10 Supine wand ER AAROM X 10 Supine AAROM flexion hands clasped X 10 Supine chest press X 5 reps Supine scapular retractions X 10 Standing isometric shoulder ER 5 sec X 10 Standing isometric shoulder IR 5 sec X 10 Standing isometric shoulder flexion 5 sec X 10   PATIENT EDUCATION: Education details: HEP, PT plan of care, selfcare Person educated: Patient Education method:  Explanation, Demonstration, Verbal cues, and Handouts Education comprehension: verbalized understanding, further education recommended   HOME EXERCISE PROGRAM: Access Code: [email or web address] URL: [email or web address] Date: 02/22/2024 Prepared by: Redell Moose  Exercises - Seated Scapular Retraction  - 3 x daily - 6 x weekly - 1 sets - 10 reps - Circular Shoulder Pendulum with Table Support  - 3 x daily - 6 x weekly - 1 sets - 15 reps - Seated Shoulder Flexion Towel Slide at Table Top  - 3 x daily - 6 x weekly - 1 sets - 10 reps - Seated Shoulder Abduction Towel Slide at Table Top (Mirrored)  - 3 x daily - 6 x weekly - 1 sets - 10 reps - 5 hold - Seated Shoulder External Rotation PROM on Table (Mirrored)  - 2-3 x daily - 6 x weekly - 1 sets - 10 reps - 5 hold - Supine Shoulder Flexion Extension AAROM with Dowel  - 2 x daily - 6 x weekly - 1 sets - 10 reps - Supine Shoulder External Rotation in 45 Degrees Abduction AAROM with Dowel (Mirrored)  - 2 x daily - 6 x weekly - 1 sets - 10 reps - Supine Shoulder Abduction AAROM with Dowel (Mirrored)  - 2 x daily - 6 x weekly - 1 sets - 10 reps - Seated AAROM Shoulder Flexion  - 2 x daily - 6 x weekly - 2 sets - 10 reps -Added on 03/10/24 active shoulder flexion, active shoulder abduction, standing rows and standing shoulder extensions.   ASSESSMENT:  CLINICAL IMPRESSION: Her ROM and strength are improving but strength remains quite limited still and she remains limited by pain, states she has been out of pain meds for 2 weeks. I did add in more strength work into her program today and into HEP but cautioned her not to push into too much pain with these.   OBJECTIVE IMPAIRMENTS: decreased activity tolerance for ADL's,, decreased endurance, decreased mobility, decreased ROM, decreased strength, impaired flexibility, impaired UE use, and pain.  ACTIVITY LIMITATIONSlifting, carry, reaching, cleaning,  driving, and or occupation  PERSONAL  FACTORS: see above PMH  also affecting patient's functional outcome.  REHAB POTENTIAL: Good  CLINICAL DECISION MAKING: Stable/uncomplicated  EVALUATION COMPLEXITY: Low    GOALS: Short term PT Goals Target date: 02/29/2024   Pt will be I and compliant with HEP. Baseline:  Goal status: MET 02/11/24 Pt will decrease pain by 25% overall with arm movements Baseline: Goal status: ongoing 03/10/24  Long term PT goals Target date:04/25/2024   Pt will improve  Rt sholder AROM to [facility] to improve functional mobility Baseline: Goal status: ongoing 03/10/24 Pt will improve  strength to at least 4+/5 MMT to improve functional strength Baseline: Goal status: ongoing 03/10/24 Pt will improve Patient specific functional scale (PSFS) to at least 7/10 to show improved function level Baseline: Goal status: ongoing 03/10/24 Pt will reduce pain to overall less than 3/10 with usual activity and work activity. Baseline: Goal status: ongoing 03/10/24  PLAN: PT FREQUENCY: 1-3 times per week   PT DURATION: 8-12 weeks  PLANNED INTERVENTIONS (unless contraindicated):  97110-Therapeutic exercises, 97530- Therapeutic activity, [id number]- Neuromuscular re-education, 97535- Self Care, 02859- Manual therapy, G0283- Electrical stimulation (unattended), 97016- Vasopneumatic device, and 97035- Ultrasound  PLAN FOR NEXT SESSION:  Progress ROM as tolerated No lifting >5 lbs No IR/backwards extension  NEXT MD VISIT: 03/10/24  Redell JONELLE Moose, PT,DPT 03/10/2024, 9:48 AM

## 2024-03-11 ENCOUNTER — Telehealth: Payer: Self-pay | Admitting: Orthopedic Surgery

## 2024-03-11 NOTE — Telephone Encounter (Signed)
 Dr. Onesimo pt - pt lvm stating that the medication requires authorization, stated the pharmacy sent a # for him to call and get the approval so they can fill it, [facility].  [phone]

## 2024-03-12 ENCOUNTER — Ambulatory Visit: Admitting: Physical Therapy

## 2024-03-12 ENCOUNTER — Encounter: Payer: Self-pay | Admitting: Physical Therapy

## 2024-03-12 DIAGNOSIS — M6281 Muscle weakness (generalized): Secondary | ICD-10-CM

## 2024-03-12 DIAGNOSIS — M25511 Pain in right shoulder: Secondary | ICD-10-CM | POA: Diagnosis not present

## 2024-03-12 NOTE — Telephone Encounter (Signed)
 Called and received # from pharmacy [phone]). PA was approved PA #: [id number]. Called pharmacy to ensure information received and called pt to update her meds should be being filled at this time.

## 2024-03-12 NOTE — Therapy (Signed)
 OUTPATIENT PHYSICAL THERAPY TREATMENT   Patient Name: Belinda Turner MRN: [id number] DOB:May 22, 1966, 58 y.o., female Today's Date: 03/12/2024  END OF SESSION:  PT End of Session - 03/12/24 0859     Visit Number 10    Number of Visits 15    Date for PT Re-Evaluation 04/25/24    PT Start Time 0830    PT Stop Time 0908    PT Time Calculation (min) 38 min    Activity Tolerance Patient tolerated treatment well    Behavior During Therapy [facility] for tasks assessed/performed             Past Medical History:  Diagnosis Date   Acute non-recurrent ethmoidal sinusitis 05/10/2023   Asthma    Bipolar 1 disorder (HCC)    Depression    Hypertension    Migraine    Nasal congestion 05/10/2023   Sore throat 05/10/2023   Stroke (HCC) 2016   2 strokes  A little weaker on right side and 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   Thyroid  disease    Past Surgical History:  Procedure Laterality Date   CESAREAN SECTION  2005   CESAREAN SECTION  2006   CHOLECYSTECTOMY  2014   COLONOSCOPY WITH PROPOFOL  N/A 03/01/2020   Procedure: COLONOSCOPY WITH PROPOFOL ;  Surgeon: Shaaron Lamar HERO, MD;  [facility];  Service: Endoscopy;  Laterality: N/A;  2:00pm   THYROIDECTOMY  2013   TOTAL ABDOMINAL HYSTERECTOMY  2010   TOTAL SHOULDER ARTHROPLASTY Right 01/21/2024   Procedure: ARTHROPLASTY, SHOULDER, TOTAL;  Surgeon: Onesimo Oneil LABOR, MD;  Location: [facility];  Service: Orthopedics;  Laterality: Right;   Patient Active Problem List   Diagnosis Date Noted   Arthritis of right glenohumeral joint 01/21/2024   Morbid obesity (HCC) 02/01/2023   Unilateral primary osteoarthritis, right knee 03/21/2022   History of stroke 03/31/2021   Other fatigue 05/10/2020   Paresthesia 05/10/2020   Primary hypertension 03/24/2020   Bipolar 1 disorder (HCC)    Depression    Asthma    Postoperative hypothyroidism 06/26/2014    PCP: Joesph Annabella HERO, FNP   REFERRING PROVIDER: Joesph Annabella HERO,  FNP   REFERRING DIAG: [phone] (ICD-10-CM) - Status post total shoulder arthroplasty, right   Rationale for Evaluation and Treatment:  Rehabiliation  THERAPY DIAG:  Acute pain of right shoulder  Muscle weakness (generalized)  ONSET DATE: DOS 01/21/24   SUBJECTIVE:  SUBJECTIVE STATEMENT:She says MD recommended 3 more weeks of PT.  PERTINENT HISTORY:  See above PMH  PAIN:  NPRS scale: 8/10 Pain location:right shoulder front and back Pain description: constant, sharp, burning Aggravating factors: touching it, moving it Relieving factors: recliner and resting it on pillow. , She does ice but says it does not help.   PRECAUTIONS: ,  Shoulder post op  RED FLAGS: None   WEIGHT BEARING RESTRICTIONS:  No  FALLS:  Has patient fallen in last 6 months? No   OCCUPATION:  Logistics, computer work   PLOF:  Independent  PATIENT GOALS:  Get arm back to normal  OBJECTIVE:  Note: Objective measures were completed at Evaluation unless otherwise noted.  PATIENT SURVEYS:  Patient-Specific Activity Scoring Scheme  0 represents "unable to perform." 10 represents "able to perform at prior level. 0 1 2 3 4 5 6 7 8 9  10 (Date and Score)   Activity Eval     1. Lifting with right arm  0    2. bathing 3    3. Household chores 2   4.    5.    Score 1.66/10    Total score = sum of the activity scores/number of activities Minimum detectable change (90%CI) for average score = 2 points Minimum detectable change (90%CI) for single activity score = 3 points   Shoulder   Hand dominance: Left  UPPER EXTREMITY ROM:  Passive ROM Right eval Right 02/19/24 Right 03/10/24  Shoulder flexion 70 90 100  Shoulder extension     Shoulder abduction 50 80 90  Shoulder adduction     Shoulder  extension     Shoulder internal rotation     Shoulder external rotation 20 40 50  Elbow flexion     Elbow extension     Wrist flexion     Wrist extension     Wrist ulnar deviation     Wrist radial deviation     Wrist pronation     Wrist supination      (Blank rows = not tested)   UPPER EXTREMITY MMT:  MMT Right eval Right 03/10/24  Shoulder flexion 1 2  Shoulder extension    Shoulder abduction 1 2  Shoulder adduction    Shoulder extension    Shoulder internal rotation 3 3  Shoulder external rotation 2 3  Middle trapezius    Lower trapezius    Elbow flexion    Elbow extension    Wrist flexion    Wrist extension    Wrist ulnar deviation    Wrist radial deviation    Wrist pronation    Wrist supination    Grip strength     (Blank rows = not tested)                                                                                                                               TREATMENT DATE: 03/12/24 Therex: Shoulder rolls X  10 posterior, X 10 anterior Seated scapular retractions AROM X 10 Seated shoulder extensions yellow X 10 Seated  isometric shoulder ER 5 sec X 10 Seated isometric shoulder IR 5 sec X 10 Seated isometric shoulder flexion 5 sec X 10   Theractivity (strength or ROM to improve functional reaching, lifting) Seated elbow flexion 2# 2X10 reps Seated shoulder flexion AROM X 5 reps Seated shoulder abduction AROM X 5 reps Seated shoulder flexion AAROM hands clasped 2X5 reps Standing rows yellow X 10 reps Standing shouder extensions yellow X 10 reps   03/10/24 Therex: Seated elbow flexion 2# 2X10 reps Seated shoulder flexion AROM X 5 reps Seated shoulder abduction AROM X 5 reps Seated shoulder flexion AAROM hands clasped 2X5 reps Seated shoulder flexion AAROM pball roll outs on mat table X 10 reps Seated shoulder abduction AAROM pball roll outs on mat table X 10 reps Seated shoulder ER AAROM pball roll outs on mat table X 10 reps Standing rows  yellow X 10 reps Standing shoulder extensions yellow X 10    PATIENT EDUCATION: Education details: HEP, PT plan of care, selfcare Person educated: Patient Education method: Explanation, Demonstration, Verbal cues, and Handouts Education comprehension: verbalized understanding, further education recommended   HOME EXERCISE PROGRAM: Access Code: [email or web address] URL: [email or web address] Date: 02/22/2024 Prepared by: Redell Moose  Exercises - Seated Scapular Retraction  - 3 x daily - 6 x weekly - 1 sets - 10 reps - Circular Shoulder Pendulum with Table Support  - 3 x daily - 6 x weekly - 1 sets - 15 reps - Seated Shoulder Flexion Towel Slide at Table Top  - 3 x daily - 6 x weekly - 1 sets - 10 reps - Seated Shoulder Abduction Towel Slide at Table Top (Mirrored)  - 3 x daily - 6 x weekly - 1 sets - 10 reps - 5 hold - Seated Shoulder External Rotation PROM on Table (Mirrored)  - 2-3 x daily - 6 x weekly - 1 sets - 10 reps - 5 hold - Supine Shoulder Flexion Extension AAROM with Dowel  - 2 x daily - 6 x weekly - 1 sets - 10 reps - Supine Shoulder External Rotation in 45 Degrees Abduction AAROM with Dowel (Mirrored)  - 2 x daily - 6 x weekly - 1 sets - 10 reps - Supine Shoulder Abduction AAROM with Dowel (Mirrored)  - 2 x daily - 6 x weekly - 1 sets - 10 reps - Seated AAROM Shoulder Flexion  - 2 x daily - 6 x weekly - 2 sets - 10 reps -Added on 03/10/24 active shoulder flexion, active shoulder abduction, standing rows and standing shoulder extensions.   ASSESSMENT:  CLINICAL IMPRESSION: Her functional reaching is improving but has been slow and still limited with this. PT will continue to work to improve this and her strength as tolerated to improve functional use of Rt arm.    OBJECTIVE IMPAIRMENTS: decreased activity tolerance for ADL's,, decreased endurance, decreased mobility, decreased ROM, decreased strength, impaired flexibility, impaired UE use, and pain.  ACTIVITY  LIMITATIONSlifting, carry, reaching, cleaning,  driving, and or occupation  PERSONAL FACTORS: see above PMH  also affecting patient's functional outcome.  REHAB POTENTIAL: Good  CLINICAL DECISION MAKING: Stable/uncomplicated  EVALUATION COMPLEXITY: Low    GOALS: Short term PT Goals Target date: 02/29/2024   Pt will be I and compliant with HEP. Baseline:  Goal status: MET 02/11/24 Pt will decrease pain by 25% overall with arm movements Baseline: Goal status: ongoing 03/10/24  Long term PT goals Target date:04/25/2024   Pt will improve Rt sholder AROM to [facility] to improve functional mobility Baseline: Goal status: ongoing 03/10/24 Pt will improve  strength to at least 4+/5 MMT to improve functional strength Baseline: Goal status: ongoing 03/10/24 Pt will improve Patient specific functional scale (PSFS) to at least 7/10 to show improved function level Baseline: Goal status: ongoing 03/10/24 Pt will reduce pain to overall less than 3/10 with usual activity and work activity. Baseline: Goal status: ongoing 03/10/24  PLAN: PT FREQUENCY: 1-3 times per week   PT DURATION: 8-12 weeks  PLANNED INTERVENTIONS (unless contraindicated):  97110-Therapeutic exercises, 97530- Therapeutic activity, [id number]- Neuromuscular re-education, 97535- Self Care, 02859- Manual therapy, [id number]- Electrical stimulation (unattended), 97016- Vasopneumatic device, and 97035- Ultrasound  PLAN FOR NEXT SESSION:  Progress ROM as tolerated No lifting >5 lbs No IR/backwards extension  NEXT MD VISIT: 03/10/24  Redell JONELLE Moose, PT,DPT 03/12/2024, 9:14 AM

## 2024-03-28 ENCOUNTER — Ambulatory Visit: Admitting: Family Medicine

## 2024-03-28 ENCOUNTER — Encounter: Payer: Self-pay | Admitting: Family Medicine

## 2024-03-28 VITALS — BP 136/76 | HR 93 | Temp 97.8°F | Ht 66.0 in | Wt 283.4 lb

## 2024-03-28 DIAGNOSIS — R195 Other fecal abnormalities: Secondary | ICD-10-CM

## 2024-03-28 DIAGNOSIS — E890 Postprocedural hypothyroidism: Secondary | ICD-10-CM | POA: Diagnosis not present

## 2024-03-28 DIAGNOSIS — R748 Abnormal levels of other serum enzymes: Secondary | ICD-10-CM

## 2024-03-28 DIAGNOSIS — R8290 Unspecified abnormal findings in urine: Secondary | ICD-10-CM

## 2024-03-28 DIAGNOSIS — F339 Major depressive disorder, recurrent, unspecified: Secondary | ICD-10-CM

## 2024-03-28 DIAGNOSIS — F319 Bipolar disorder, unspecified: Secondary | ICD-10-CM

## 2024-03-28 DIAGNOSIS — I10 Essential (primary) hypertension: Secondary | ICD-10-CM | POA: Diagnosis not present

## 2024-03-28 DIAGNOSIS — E782 Mixed hyperlipidemia: Secondary | ICD-10-CM | POA: Insufficient documentation

## 2024-03-28 DIAGNOSIS — L304 Erythema intertrigo: Secondary | ICD-10-CM

## 2024-03-28 DIAGNOSIS — Z8673 Personal history of transient ischemic attack (TIA), and cerebral infarction without residual deficits: Secondary | ICD-10-CM

## 2024-03-28 DIAGNOSIS — J4540 Moderate persistent asthma, uncomplicated: Secondary | ICD-10-CM

## 2024-03-28 DIAGNOSIS — Z23 Encounter for immunization: Secondary | ICD-10-CM

## 2024-03-28 LAB — URINALYSIS, ROUTINE W REFLEX MICROSCOPIC
Bilirubin, UA: NEGATIVE
Glucose, UA: NEGATIVE
Ketones, UA: NEGATIVE
Leukocytes,UA: NEGATIVE
Nitrite, UA: NEGATIVE
Protein,UA: NEGATIVE
RBC, UA: NEGATIVE
Specific Gravity, UA: 1.015 (ref 1.005–1.030)
Urobilinogen, Ur: 0.2 mg/dL (ref 0.2–1.0)
pH, UA: 7.5 (ref 5.0–7.5)

## 2024-03-28 MED ORDER — NYSTATIN 100000 UNIT/GM EX POWD: 1.0000 | Freq: Three times a day (TID) | CUTANEOUS | 0 refills | Status: AC

## 2024-03-28 MED ORDER — ARIPIPRAZOLE 10 MG PO TABS: 10.0000 mg | ORAL_TABLET | Freq: Every day | ORAL | 3 refills | Status: DC

## 2024-03-28 MED ORDER — ROSUVASTATIN CALCIUM 5 MG PO TABS: 5.0000 mg | ORAL_TABLET | Freq: Every day | ORAL | 0 refills | Status: DC

## 2024-03-28 MED ORDER — LEVOTHYROXINE SODIUM 112 MCG PO TABS
112.0000 ug | ORAL_TABLET | Freq: Every day | ORAL | 3 refills | Status: AC
Start: 2024-03-28 — End: ?

## 2024-03-28 MED ORDER — TRAZODONE HCL 100 MG PO TABS: ORAL_TABLET | ORAL | 0 refills | Status: DC

## 2024-03-28 NOTE — Progress Notes (Signed)
 Established Patient Office Visit  Subjective   Patient ID: Belinda Turner, female    DOB: 1966-02-19  Age: 58 y.o. MRN: [id number]  Chief Complaint  Patient presents with   Medical Management of Chronic Issues    HPI  History of Present Illness   Belinda Turner is a 58 year old female who presents for an establishe care appointment and blood pressure recheck.  Hypertension - Home blood pressure readings range between 129/70 and 138/70 - Currently taking losartan  100 mg, recently increased - Feels antihypertensive therapy is effective - No chest pain, shortness of breath, or peripheral edema  Knee pain and lower extremity swelling - Significant knee pain and swelling from the knees down towards the end of the day  Asthma and allergic rhinitis - Moderate persistent asthma with daily wheezing for over 30 years - Uses maintenance inhaler daily and Flonase  daily  GU symptoms - Dark urine with odor for the past month - No visible blood in urine or stool but she can smell blood  - H/o hysterectomy - itching in skin fold above pelvis - No vaginal symptoms - Not sexually active  Bipolar disorder - Increased depression and agitation lately - No history of mania - Used to be on abilify  with improvement. Vraylar  was then added. She was on vraylar  for years but discontinued due to tremor - Taking trazodone  prn for sleep with relief.         03/28/2024    8:54 AM 12/25/2023    8:35 AM 06/26/2023    8:58 AM  Depression screen PHQ 2/9  Decreased Interest 1 0 0  Down, Depressed, Hopeless 1 0 0  PHQ - 2 Score 2 0 0  Altered sleeping 1 1 0  Tired, decreased energy 1 0 0  Change in appetite 0 0 0  Feeling bad or failure about yourself  0 0 0  Trouble concentrating 0 0 0  Moving slowly or fidgety/restless 0 0 0  Suicidal thoughts 0 0 0  PHQ-9 Score 4 1 0  Difficult doing work/chores Not difficult at all Not difficult at all Not difficult at all      03/28/2024    8:55  AM 12/25/2023    8:35 AM 06/26/2023    8:59 AM 05/10/2023   10:49 AM  GAD 7 : Generalized Anxiety Score  Nervous, Anxious, on Edge 1 1 0 0  Control/stop worrying 0 0 0 0  Worry too much - different things 0 0 0 0  Trouble relaxing 1 1 0 1  Restless 0 0 0 0  Easily annoyed or irritable 1 0 0 0  Afraid - awful might happen 0 0 0 0  Total GAD 7 Score 3 2 0 1  Anxiety Difficulty Not difficult at all Not difficult at all Not difficult at all Not difficult at all        ROS As per HPI.    Objective:     BP 136/76 Comment: home reading  Pulse 93   Temp 97.8 F (36.6 C) (Temporal)   Ht 5' 6 (1.676 m)   Wt 283 lb 6.4 oz (128.5 kg)   SpO2 100%   BMI 45.74 kg/m  Wt Readings from Last 3 Encounters:  03/28/24 283 lb 6.4 oz (128.5 kg)  01/14/24 288 lb (130.6 kg)  12/25/23 288 lb (130.6 kg)      Physical Exam Vitals and nursing note reviewed.  Constitutional:      General: She is  not in acute distress.    Appearance: She is obese. She is not ill-appearing, toxic-appearing or diaphoretic.  Cardiovascular:     Rate and Rhythm: Normal rate and regular rhythm.     Pulses: Normal pulses.     Heart sounds: Normal heart sounds. No murmur heard. Pulmonary:     Effort: Pulmonary effort is normal. No respiratory distress.     Breath sounds: Normal breath sounds.  Abdominal:     General: Bowel sounds are normal. There is no distension.     Palpations: Abdomen is soft. There is no mass.     Tenderness: There is no abdominal tenderness. There is no guarding or rebound.  Musculoskeletal:     Cervical back: Neck supple. No tenderness.     Right lower leg: No edema.     Left lower leg: No edema.  Lymphadenopathy:     Cervical: No cervical adenopathy.  Skin:    General: Skin is warm and dry.     Findings: Rash (Intertrigo in lower abdominal skin folds) present.  Neurological:     General: No focal deficit present.     Mental Status: She is alert and oriented to person, place, and  time.  Psychiatric:        Mood and Affect: Mood normal.        Behavior: Behavior normal.      No results found for any visits on 03/28/24.    The ASCVD Risk score (Arnett DK, et al., 2019) failed to calculate for the following reasons:   Risk score cannot be calculated because patient has a medical history suggesting prior/existing ASCVD    Assessment & Plan:   Belinda Turner was seen today for medical management of chronic issues.  Diagnoses and all orders for this visit:  Primary hypertension -     CBC with Differential/Platelet -     CMP14+EGFR  Depression, recurrent (HCC) -     traZODone  (DESYREL ) 100 MG tablet; TAKE 1 TABLET BY MOUTH EVERYDAY AT BEDTIME  Bipolar 1 disorder (HCC) -     ARIPiprazole  (ABILIFY ) 10 MG tablet; Take 1 tablet (10 mg total) by mouth daily.  Morbid obesity (HCC)  Postoperative hypothyroidism -     levothyroxine  (SYNTHROID ) 112 MCG tablet; Take 1 tablet (112 mcg total) by mouth daily before breakfast. -     TSH -     T4, Free  History of stroke -     rosuvastatin  (CRESTOR ) 5 MG tablet; Take 1 tablet (5 mg total) by mouth daily.  Moderate persistent asthma without complication  Elevated liver enzymes  Abnormal stools -     Urinalysis, Routine w reflex microscopic -     Fecal occult blood, imunochemical -     Cancel: WET PREP FOR TRICH, YEAST, CLUE  Intertrigo -     nystatin  powder; Apply 1 Application topically 3 (three) times daily for 10 days.  Abnormal urine odor  Need for vaccination against Streptococcus pneumoniae -     Pneumococcal conjugate vaccine 20-valent (Prevnar 20)      Bipolar disorder, depressive type Experiencing depressive symptoms. Denies SI. Stopped vraylar  previously due to tremor. Abilify  effective in past. - Start Abilify  10 mg daily. - Follow up in 6 weeks to assess response.  Moderate persistent asthma Daily symptoms managed with inhalers and Flonase . - Continue current asthma management with inhalers  and Flonase .  Primary hypertension Blood pressure well-controlled with losartan  100 mg. - Continue losartan  100 mg daily.   History of stroke  Cholesterol levels well-controlled with rosuvastatin  post-stroke. - Continue rosuvastatin  as prescribed.  Morbid obesity Weight has trended down.   Postprocedural hypothyroidism Thyroid  hormone levels stable post-thyroidectomy. Compliant with levothyroxine  - Check thyroid  function tests today.  Elevated liver enzymes Previous elevation possibly related to Tylenol  use. - Recheck liver enzymes today.  Intertrigo Yeast rash in skinfold causing itching and odor. - Prescribe nystatin  powder, apply three times daily for 10 days. - Reassess if rash returns.  Abnormal stool Reports smell of blood.  - FOBT ordered. Will check CBC     Abnormal urine odor Negative UA today - Increase hydration.   Total time spent caring for the patient today was 40 minutes. This includes time spent before the visit reviewing the chart, time spent during the visit, and time spent after the visit on documentation.   Return in about 6 weeks (around 05/09/2024) for medication follow up.   The patient indicates understanding of these issues and agrees with the plan.  Belinda CHRISTELLA Search, FNP

## 2024-03-29 LAB — CMP14+EGFR
ALT: 56 IU/L — ABNORMAL HIGH (ref 0–32)
AST: 72 IU/L — ABNORMAL HIGH (ref 0–40)
Albumin: 4.3 g/dL (ref 3.8–4.9)
Alkaline Phosphatase: 103 IU/L (ref 44–121)
BUN/Creatinine Ratio: 14 (ref 9–23)
BUN: 13 mg/dL (ref 6–24)
Bilirubin Total: 0.8 mg/dL (ref 0.0–1.2)
CO2: 24 mmol/L (ref 20–29)
Calcium: 9.7 mg/dL (ref 8.7–10.2)
Chloride: 100 mmol/L (ref 96–106)
Creatinine, Ser: 0.9 mg/dL (ref 0.57–1.00)
Globulin, Total: 2.4 g/dL (ref 1.5–4.5)
Glucose: 87 mg/dL (ref 70–99)
Potassium: 5.1 mmol/L (ref 3.5–5.2)
Sodium: 139 mmol/L (ref 134–144)
Total Protein: 6.7 g/dL (ref 6.0–8.5)
eGFR: 74 mL/min/1.73 (ref >59)

## 2024-03-29 LAB — TSH: TSH: 1.8 u[IU]/mL (ref 0.450–4.500)

## 2024-03-29 LAB — CBC WITH DIFFERENTIAL/PLATELET
Basophils Absolute: 0 x10E3/uL (ref 0.0–0.2)
Basos: 1 %
EOS (ABSOLUTE): 0.2 x10E3/uL (ref 0.0–0.4)
Eos: 2 %
Hematocrit: 45 % (ref 34.0–46.6)
Hemoglobin: 14.3 g/dL (ref 11.1–15.9)
Immature Grans (Abs): 0 x10E3/uL (ref 0.0–0.1)
Immature Granulocytes: 0 %
Lymphocytes Absolute: 2.6 x10E3/uL (ref 0.7–3.1)
Lymphs: 32 %
MCH: 28.3 pg (ref 26.6–33.0)
MCHC: 31.8 g/dL (ref 31.5–35.7)
MCV: 89 fL (ref 79–97)
Monocytes Absolute: 0.9 x10E3/uL (ref 0.1–0.9)
Monocytes: 10 %
Neutrophils Absolute: 4.5 x10E3/uL (ref 1.4–7.0)
Neutrophils: 55 %
Platelets: 276 x10E3/uL (ref 150–450)
RBC: 5.06 x10E6/uL (ref 3.77–5.28)
RDW: 14.6 % (ref 11.7–15.4)
WBC: 8.2 x10E3/uL (ref 3.4–10.8)

## 2024-03-29 LAB — T4, FREE: Free T4: 1.49 ng/dL (ref 0.82–1.77)

## 2024-03-31 ENCOUNTER — Ambulatory Visit: Payer: Self-pay | Admitting: Family Medicine

## 2024-03-31 ENCOUNTER — Ambulatory Visit: Attending: Orthopedic Surgery | Admitting: Physical Therapy

## 2024-03-31 ENCOUNTER — Encounter: Payer: Self-pay | Admitting: Physical Therapy

## 2024-03-31 DIAGNOSIS — R748 Abnormal levels of other serum enzymes: Secondary | ICD-10-CM

## 2024-03-31 DIAGNOSIS — M6281 Muscle weakness (generalized): Secondary | ICD-10-CM | POA: Insufficient documentation

## 2024-03-31 DIAGNOSIS — M25511 Pain in right shoulder: Secondary | ICD-10-CM | POA: Insufficient documentation

## 2024-03-31 NOTE — Therapy (Signed)
 OUTPATIENT PHYSICAL THERAPY TREATMENT   Patient Name: Belinda Turner MRN: [id number] DOB:[birth year], 58 y.o., female Today's Date: 03/31/2024  END OF SESSION:  PT End of Session - 03/31/24 1546     Visit Number 11    Number of Visits 15    Date for PT Re-Evaluation 04/25/24    PT Start Time 1546    PT Stop Time 1625    PT Time Calculation (min) 39 min    Activity Tolerance Patient tolerated treatment well    Behavior During Therapy [facility] for tasks assessed/performed              Past Medical History:  Diagnosis Date   Acute non-recurrent ethmoidal sinusitis 05/10/2023   Asthma    Bipolar 1 disorder (HCC)    Depression    Hypertension    Migraine    Nasal congestion 05/10/2023   Sore throat 05/10/2023   Stroke (HCC) 2016   2 strokes  A little weaker on right side and 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   Thyroid  disease    Past Surgical History:  Procedure Laterality Date   CESAREAN SECTION  2005   CESAREAN SECTION  2006   CHOLECYSTECTOMY  2014   COLONOSCOPY WITH PROPOFOL  N/A 03/01/2020   Procedure: COLONOSCOPY WITH PROPOFOL ;  Surgeon: Shaaron Lamar HERO, MD;  Location: [facility];  Service: Endoscopy;  Laterality: N/A;  2:00pm   THYROIDECTOMY  2013   TOTAL ABDOMINAL HYSTERECTOMY  2010   TOTAL SHOULDER ARTHROPLASTY Right 01/21/2024   Procedure: ARTHROPLASTY, SHOULDER, TOTAL;  Surgeon: Onesimo Oneil LABOR, MD;  Location: [facility];  Service: Orthopedics;  Laterality: Right;   Patient Active Problem List   Diagnosis Date Noted   Mixed hyperlipidemia 03/28/2024   Elevated liver enzymes 03/28/2024   Arthritis of right glenohumeral joint 01/21/2024   Morbid obesity (HCC) 02/01/2023   Unilateral primary osteoarthritis, right knee 03/21/2022   History of stroke 03/31/2021   Other fatigue 05/10/2020   Paresthesia 05/10/2020   Primary hypertension 03/24/2020   Bipolar 1 disorder (HCC)    Depression    Asthma    Postoperative hypothyroidism 06/26/2014    PCP: Joesph Annabella HERO, FNP   REFERRING PROVIDER: Joesph Annabella HERO, FNP   REFERRING DIAG: [phone] (ICD-10-CM) - Status post total shoulder arthroplasty, right   Rationale for Evaluation and Treatment:  Rehabiliation  THERAPY DIAG:  Acute pain of right shoulder  Muscle weakness (generalized)  ONSET DATE: DOS 01/21/24   SUBJECTIVE:  SUBJECTIVE STATEMENT: 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 Still notes continued pain along proximal humerus. Arm starts swelling by 11am - 12pm. Reports doing exercises daily.   PERTINENT HISTORY:  See above PMH  PAIN:  NPRS scale: 8/10 Pain location:right shoulder front and back Pain description: constant, sharp, burning Aggravating factors: touching it, moving it Relieving factors: recliner and resting it on pillow. , She does ice but says it does not help.   PRECAUTIONS: ,  Shoulder post op  RED FLAGS: None   WEIGHT BEARING RESTRICTIONS:  No  FALLS:  Has patient fallen in last 6 months? No   OCCUPATION:  Logistics, computer work   PLOF:  Independent  PATIENT GOALS:  Get arm back to normal  OBJECTIVE:  Note: Objective measures were completed at Evaluation unless otherwise noted.  PATIENT SURVEYS:  Patient-Specific Activity Scoring Scheme  0 represents "unable to perform." 10 represents "able to perform at prior level. 0 1 2 3 4 5 6 7 8 9  10 (Date and Score)   Activity Eval     1. Lifting with right arm  0    2. bathing 3    3. Household chores 2   4.    5.    Score 1.66/10    Total score = sum of the activity scores/number of activities Minimum detectable change (90%CI) for average score = 2 points Minimum detectable change (90%CI) for single activity score = 3 points   Shoulder   Hand dominance:  Left  UPPER EXTREMITY ROM:  Passive ROM Right eval Right 02/19/24 Right 03/10/24  Shoulder flexion 70 90 100  Shoulder extension     Shoulder abduction 50 80 90  Shoulder adduction     Shoulder extension     Shoulder internal rotation     Shoulder external rotation 20 40 50  Elbow flexion     Elbow extension     Wrist flexion     Wrist extension     Wrist ulnar deviation     Wrist radial deviation     Wrist pronation     Wrist supination      (Blank rows = not tested)   UPPER EXTREMITY MMT:  MMT Right eval Right 03/10/24  Shoulder flexion 1 2  Shoulder extension    Shoulder abduction 1 2  Shoulder adduction    Shoulder extension    Shoulder internal rotation 3 3  Shoulder external rotation 2 3  Middle trapezius    Lower trapezius    Elbow flexion    Elbow extension    Wrist flexion    Wrist extension    Wrist ulnar deviation    Wrist radial deviation    Wrist pronation    Wrist supination    Grip strength     (Blank rows = not tested)  TREATMENT DATE: 03/31/24 Therex: Nustep L3 x 5 min UEs Seated thoracic extension with low shoulder horizontal abd 2x10 Seated UT stretch x 30 Seated levator scap stretch x 30 Standing pendulums CW & CCW x20 each Attempted shoulder flexion wall slide but pt with limited tolerance  Nmed: Standing finger wall ladder flexion and then scaption x5 each working on keeping UT from [employer] scapular clock x10 each direction (seated for pt's knees) Wall push up plus seated x10 Seated shoulder ER with towel under arm x10  Therapeutic Activity: Standing row yellow TB x10 Standing shouder extensions yellow X 10 reps  Manual therapy: STM & TPR R pec minor/major, UT Scar massage Scapular mobilizations    03/12/24 Therex: Shoulder rolls X 10 posterior, X 10 anterior Seated scapular  retractions AROM X 10 Seated shoulder extensions yellow X 10 Seated  isometric shoulder ER 5 sec X 10 Seated isometric shoulder IR 5 sec X 10 Seated isometric shoulder flexion 5 sec X 10   Theractivity (strength or ROM to improve functional reaching, lifting) Seated elbow flexion 2# 2X10 reps Seated shoulder flexion AROM X 5 reps Seated shoulder abduction AROM X 5 reps Seated shoulder flexion AAROM hands clasped 2X5 reps Standing rows yellow X 10 reps Standing shouder extensions yellow X 10 reps   03/10/24 Therex: Seated elbow flexion 2# 2X10 reps Seated shoulder flexion AROM X 5 reps Seated shoulder abduction AROM X 5 reps Seated shoulder flexion AAROM hands clasped 2X5 reps Seated shoulder flexion AAROM pball roll outs on mat table X 10 reps Seated shoulder abduction AAROM pball roll outs on mat table X 10 reps Seated shoulder ER AAROM pball roll outs on mat table X 10 reps Standing rows yellow X 10 reps Standing shoulder extensions yellow X 10    PATIENT EDUCATION: Education details: HEP, PT plan of care, selfcare Person educated: Patient Education method: Explanation, Demonstration, Verbal cues, and Handouts Education comprehension: verbalized understanding, further education recommended   HOME EXERCISE PROGRAM: Access Code: [email or web address] URL: [email or web address] Date: 02/22/2024 Prepared by: Redell Moose  Exercises - Seated Scapular Retraction  - 3 x daily - 6 x weekly - 1 sets - 10 reps - Circular Shoulder Pendulum with Table Support  - 3 x daily - 6 x weekly - 1 sets - 15 reps - Seated Shoulder Flexion Towel Slide at Table Top  - 3 x daily - 6 x weekly - 1 sets - 10 reps - Seated Shoulder Abduction Towel Slide at Table Top (Mirrored)  - 3 x daily - 6 x weekly - 1 sets - 10 reps - 5 hold - Seated Shoulder External Rotation PROM on Table (Mirrored)  - 2-3 x daily - 6 x weekly - 1 sets - 10 reps - 5 hold - Supine Shoulder Flexion Extension AAROM with  Dowel  - 2 x daily - 6 x weekly - 1 sets - 10 reps - Supine Shoulder External Rotation in 45 Degrees Abduction AAROM with Dowel (Mirrored)  - 2 x daily - 6 x weekly - 1 sets - 10 reps - Supine Shoulder Abduction AAROM with Dowel (Mirrored)  - 2 x daily - 6 x weekly - 1 sets - 10 reps - Seated AAROM Shoulder Flexion  - 2 x daily - 6 x weekly - 2 sets - 10 reps -Added on 03/10/24 active shoulder flexion, active shoulder abduction, standing rows and standing shoulder extensions.   ASSESSMENT:  CLINICAL IMPRESSION: Added gentle manual therapy to improve scapular mobility and decrease pec  and scar tissue tightness. Worked on improving scapular strength/mobility to improve scapulohumeral rhythm for overhead reaching. Became very fatigued with scapular wall clock. Remains limited by anterior shoulder pain. Cues required to relax UT. Row and shoulder ext has been getting easier so cut red TB for pt to use at home.   OBJECTIVE IMPAIRMENTS: decreased activity tolerance for ADL's,, decreased endurance, decreased mobility, decreased ROM, decreased strength, impaired flexibility, impaired UE use, and pain.  ACTIVITY LIMITATIONSlifting, carry, reaching, cleaning,  driving, and or occupation  PERSONAL FACTORS: see above PMH  also affecting patient's functional outcome.  REHAB POTENTIAL: Good  CLINICAL DECISION MAKING: Stable/uncomplicated  EVALUATION COMPLEXITY: Low    GOALS: Short term PT Goals Target date: 02/29/2024   Pt will be I and compliant with HEP. Baseline:  Goal status: MET 02/11/24 Pt will decrease pain by 25% overall with arm movements Baseline: Goal status: ongoing 03/10/24  Long term PT goals Target date:04/25/2024   Pt will improve Rt sholder AROM to [facility] to improve functional mobility Baseline: Goal status: ongoing 03/10/24 Pt will improve  strength to at least 4+/5 MMT to improve functional strength Baseline: Goal status: ongoing 03/10/24 Pt will improve Patient specific  functional scale (PSFS) to at least 7/10 to show improved function level Baseline: Goal status: ongoing 03/10/24 Pt will reduce pain to overall less than 3/10 with usual activity and work activity. Baseline: Goal status: ongoing 03/10/24  PLAN: PT FREQUENCY: 1-3 times per week   PT DURATION: 8-12 weeks  PLANNED INTERVENTIONS (unless contraindicated):  97110-Therapeutic exercises, 97530- Therapeutic activity, 97112- Neuromuscular re-education, 97535- Self Care, [id number]- Manual therapy, [id number]- Electrical stimulation (unattended), 97016- Vasopneumatic device, and 97035- Ultrasound  PLAN FOR NEXT SESSION:  Progress ROM as tolerated No lifting >5 lbs No IR/backwards extension  NEXT MD VISIT: not noted in chart  Mckenzy Salazar April Ma L Haseeb Fiallos, PT,DPT 03/31/2024, 3:46 PM

## 2024-04-02 ENCOUNTER — Ambulatory Visit: Admitting: Physical Therapy

## 2024-04-02 ENCOUNTER — Encounter: Payer: Self-pay | Admitting: Physical Therapy

## 2024-04-02 DIAGNOSIS — M6281 Muscle weakness (generalized): Secondary | ICD-10-CM | POA: Diagnosis not present

## 2024-04-02 DIAGNOSIS — M25511 Pain in right shoulder: Secondary | ICD-10-CM | POA: Diagnosis not present

## 2024-04-02 NOTE — Therapy (Signed)
 OUTPATIENT PHYSICAL THERAPY TREATMENT   Patient Name: Belinda Turner MRN: [id number] DOB:March 14, 1966, 58 y.o., female Today's Date: 04/02/2024  END OF SESSION:  PT End of Session - 04/02/24 1558     Visit Number 12    Number of Visits 15    Date for PT Re-Evaluation 04/25/24    PT Start Time 1558    PT Stop Time 1640    PT Time Calculation (min) 42 min    Activity Tolerance Patient tolerated treatment well    Behavior During Therapy [facility] for tasks assessed/performed               Past Medical History:  Diagnosis Date   Acute non-recurrent ethmoidal sinusitis 05/10/2023   Asthma    Bipolar 1 disorder (HCC)    Depression    Hypertension    Migraine    Nasal congestion 05/10/2023   Sore throat 05/10/2023   Stroke (HCC) 2016   2 strokes  A little weaker on right side and 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   Thyroid  disease    Past Surgical History:  Procedure Laterality Date   CESAREAN SECTION  2005   CESAREAN SECTION  2006   CHOLECYSTECTOMY  2014   COLONOSCOPY WITH PROPOFOL  N/A 03/01/2020   Procedure: COLONOSCOPY WITH PROPOFOL ;  Surgeon: Shaaron Lamar HERO, MD;  Location: [facility];  Service: Endoscopy;  Laterality: N/A;  2:00pm   THYROIDECTOMY  2013   TOTAL ABDOMINAL HYSTERECTOMY  2010   TOTAL SHOULDER ARTHROPLASTY Right 01/21/2024   Procedure: ARTHROPLASTY, SHOULDER, TOTAL;  Surgeon: Onesimo Oneil LABOR, MD;  Location: [facility];  Service: Orthopedics;  Laterality: Right;   Patient Active Problem List   Diagnosis Date Noted   Mixed hyperlipidemia 03/28/2024   Elevated liver enzymes 03/28/2024   Arthritis of right glenohumeral joint 01/21/2024   Morbid obesity (HCC) 02/01/2023   Unilateral primary osteoarthritis, right knee 03/21/2022   History of stroke 03/31/2021   Other fatigue 05/10/2020   Paresthesia 05/10/2020   Primary hypertension 03/24/2020   Bipolar 1 disorder (HCC)    Depression    Asthma    Postoperative hypothyroidism 06/26/2014    PCP:  Joesph Annabella HERO, FNP   REFERRING PROVIDER: Onesimo Oneil LABOR, MD   REFERRING DIAG: [phone] (ICD-10-CM) - Status post total shoulder arthroplasty, right   Rationale for Evaluation and Treatment:  Rehabiliation  THERAPY DIAG:  Acute pain of right shoulder  Muscle weakness (generalized)  ONSET DATE: DOS 01/21/24   SUBJECTIVE:  SUBJECTIVE STATEMENT: Pt states she did the massaging but may have over did it. Feels very sore in the front of her shoulder.   PERTINENT HISTORY:  See above PMH  PAIN:  NPRS scale: 8/10 Pain location:right shoulder front and back Pain description: constant, sharp, burning Aggravating factors: touching it, moving it Relieving factors: recliner and resting it on pillow. , She does ice but says it does not help.   PRECAUTIONS: ,  Shoulder post op  RED FLAGS: None   WEIGHT BEARING RESTRICTIONS:  No  FALLS:  Has patient fallen in last 6 months? No   OCCUPATION:  Logistics, computer work   PLOF:  Independent  PATIENT GOALS:  Get arm back to normal  OBJECTIVE:  Note: Objective measures were completed at Evaluation unless otherwise noted.  PATIENT SURVEYS:  Patient-Specific Activity Scoring Scheme  0 represents "unable to perform." 10 represents "able to perform at prior level. 0 1 2 3 4 5 6 7 8 9  10 (Date and Score)   Activity Eval     1. Lifting with right arm  0    2. bathing 3    3. Household chores 2   4.    5.    Score 1.66/10    Total score = sum of the activity scores/number of activities Minimum detectable change (90%CI) for average score = 2 points Minimum detectable change (90%CI) for single activity score = 3 points   Shoulder   Hand dominance: Left  UPPER EXTREMITY ROM:  Passive ROM Right eval Right 02/19/24 Right 03/10/24   Shoulder flexion 70 90 100  Shoulder extension     Shoulder abduction 50 80 90  Shoulder adduction     Shoulder extension     Shoulder internal rotation     Shoulder external rotation 20 40 50  Elbow flexion     Elbow extension     Wrist flexion     Wrist extension     Wrist ulnar deviation     Wrist radial deviation     Wrist pronation     Wrist supination      (Blank rows = not tested)   UPPER EXTREMITY MMT:  MMT Right eval Right 03/10/24  Shoulder flexion 1 2  Shoulder extension    Shoulder abduction 1 2  Shoulder adduction    Shoulder extension    Shoulder internal rotation 3 3  Shoulder external rotation 2 3  Middle trapezius    Lower trapezius    Elbow flexion    Elbow extension    Wrist flexion    Wrist extension    Wrist ulnar deviation    Wrist radial deviation    Wrist pronation    Wrist supination    Grip strength     (Blank rows = not tested)  TREATMENT DATE: 04/02/24 Therex: Supine pec stretch with arm out in horizontal abd x30 Supine serratus punch with PT assist as needed 2x10 Supine scap squeeze with arm up in air 2x10 Supine shoulder flexion eccentrics x5 Sidelying shoulder ER eccentrics x5 Sidelying FER to top of head and down to neck x3 Sidelying shoulder abd eccentrics x 3 with mod PT assist Standing pendulums  Nmed: Supine shoulder flexed 90 deg rhythmic stabilization Sidelying scapular clock x10 each direction  Manual therapy: Gentle GH joint mobs grade II in all directions Scapular mobilizations   03/31/24 Therex: Nustep L3 x 5 min UEs Seated thoracic extension with low shoulder horizontal abd 2x10 Seated UT stretch x 30 Seated levator scap stretch x 30 Standing pendulums CW & CCW x20 each Attempted shoulder flexion wall slide but pt with limited tolerance  Nmed: Standing finger wall ladder  flexion and then scaption x5 each working on keeping UT from [employer] scapular clock x10 each direction (seated for pt's knees) Wall push up plus seated x10 Seated shoulder ER with towel under arm x10  Therapeutic Activity: Standing row yellow TB x10 Standing shouder extensions yellow X 10 reps  Manual therapy: STM & TPR R pec minor/major, UT Scar massage Scapular mobilizations    03/12/24 Therex: Shoulder rolls X 10 posterior, X 10 anterior Seated scapular retractions AROM X 10 Seated shoulder extensions yellow X 10 Seated  isometric shoulder ER 5 sec X 10 Seated isometric shoulder IR 5 sec X 10 Seated isometric shoulder flexion 5 sec X 10   Theractivity (strength or ROM to improve functional reaching, lifting) Seated elbow flexion 2# 2X10 reps Seated shoulder flexion AROM X 5 reps Seated shoulder abduction AROM X 5 reps Seated shoulder flexion AAROM hands clasped 2X5 reps Standing rows yellow X 10 reps Standing shouder extensions yellow X 10 reps     PATIENT EDUCATION: Education details: HEP, PT plan of care, selfcare Person educated: Patient Education method: Explanation, Demonstration, Verbal cues, and Handouts Education comprehension: verbalized understanding, further education recommended   HOME EXERCISE PROGRAM: Access Code: [email or web address] URL: [email or web address] Date: 02/22/2024 Prepared by: Redell Moose  Exercises - Seated Scapular Retraction  - 3 x daily - 6 x weekly - 1 sets - 10 reps - Circular Shoulder Pendulum with Table Support  - 3 x daily - 6 x weekly - 1 sets - 15 reps - Seated Shoulder Flexion Towel Slide at Table Top  - 3 x daily - 6 x weekly - 1 sets - 10 reps - Seated Shoulder Abduction Towel Slide at Table Top (Mirrored)  - 3 x daily - 6 x weekly - 1 sets - 10 reps - 5 hold - Seated Shoulder External Rotation PROM on Table (Mirrored)  - 2-3 x daily - 6 x weekly - 1 sets - 10 reps - 5 hold - Supine Shoulder Flexion  Extension AAROM with Dowel  - 2 x daily - 6 x weekly - 1 sets - 10 reps - Supine Shoulder External Rotation in 45 Degrees Abduction AAROM with Dowel (Mirrored)  - 2 x daily - 6 x weekly - 1 sets - 10 reps - Supine Shoulder Abduction AAROM with Dowel (Mirrored)  - 2 x daily - 6 x weekly - 1 sets - 10 reps - Seated AAROM Shoulder Flexion  - 2 x daily - 6 x weekly - 2 sets - 10 reps -Added on 03/10/24 active shoulder flexion, active shoulder abduction, standing rows and standing shoulder extensions.   ASSESSMENT:  CLINICAL  IMPRESSION: Continued to work on strengthening in more gravity eliminated/assisted positions. Still unable to concentrically perform AROM of R shoulder so focused on eccentrics with PT assist. Pt has been able to do isometrics. Continued to work on scapular strength and mobility.   OBJECTIVE IMPAIRMENTS: decreased activity tolerance for ADL's,, decreased endurance, decreased mobility, decreased ROM, decreased strength, impaired flexibility, impaired UE use, and pain.  ACTIVITY LIMITATIONSlifting, carry, reaching, cleaning,  driving, and or occupation  PERSONAL FACTORS: see above PMH  also affecting patient's functional outcome.  REHAB POTENTIAL: Good  CLINICAL DECISION MAKING: Stable/uncomplicated  EVALUATION COMPLEXITY: Low    GOALS: Short term PT Goals Target date: 02/29/2024   Pt will be I and compliant with HEP. Baseline:  Goal status: MET 02/11/24 Pt will decrease pain by 25% overall with arm movements Baseline: Goal status: ongoing 03/10/24  Long term PT goals Target date:04/25/2024   Pt will improve Rt sholder AROM to [facility] to improve functional mobility Baseline: Goal status: ongoing 03/10/24 Pt will improve  strength to at least 4+/5 MMT to improve functional strength Baseline: Goal status: ongoing 03/10/24 Pt will improve Patient specific functional scale (PSFS) to at least 7/10 to show improved function level Baseline: Goal status: ongoing 03/10/24 Pt  will reduce pain to overall less than 3/10 with usual activity and work activity. Baseline: Goal status: ongoing 03/10/24  PLAN: PT FREQUENCY: 1-3 times per week   PT DURATION: 8-12 weeks  PLANNED INTERVENTIONS (unless contraindicated):  97110-Therapeutic exercises, 97530- Therapeutic activity, 97112- Neuromuscular re-education, 97535- Self Care, 02859- Manual therapy, G0283- Electrical stimulation (unattended), 97016- Vasopneumatic device, and 97035- Ultrasound  PLAN FOR NEXT SESSION:  Progress ROM as tolerated No lifting >5 lbs No IR/backwards extension  NEXT MD VISIT: not noted in chart  Issachar Broady April Ma L Makell Drohan, PT,DPT 04/02/2024, 3:58 PM

## 2024-04-06 ENCOUNTER — Encounter: Payer: Self-pay | Admitting: Orthopedic Surgery

## 2024-04-07 ENCOUNTER — Ambulatory Visit: Admitting: Physical Therapy

## 2024-04-07 ENCOUNTER — Encounter: Payer: Self-pay | Admitting: Physical Therapy

## 2024-04-07 DIAGNOSIS — M6281 Muscle weakness (generalized): Secondary | ICD-10-CM

## 2024-04-07 DIAGNOSIS — M25511 Pain in right shoulder: Secondary | ICD-10-CM | POA: Diagnosis not present

## 2024-04-07 MED ORDER — OXYCODONE HCL 5 MG PO CAPS: 5.0000 mg | ORAL_CAPSULE | Freq: Four times a day (QID) | ORAL | 0 refills | Status: DC | PRN

## 2024-04-07 NOTE — Addendum Note (Signed)
 Addended byBETHA JENEAN GREIG LELON on: 04/07/2024 08:21 AM   Modules accepted: Orders

## 2024-04-07 NOTE — Therapy (Signed)
 OUTPATIENT PHYSICAL THERAPY TREATMENT   Patient Name: Jeda Pardue MRN: [id number] DOB:Jan 16, 1966, 58 y.o., female Today's Date: 04/07/2024  END OF SESSION:  PT End of Session - 04/07/24 1605     Visit Number 13    Number of Visits 15    Date for PT Re-Evaluation 04/25/24    PT Start Time 1605    PT Stop Time 1645    PT Time Calculation (min) 40 min    Activity Tolerance Patient tolerated treatment well    Behavior During Therapy [facility] for tasks assessed/performed           Past Medical History:  Diagnosis Date   Acute non-recurrent ethmoidal sinusitis 05/10/2023   Asthma    Bipolar 1 disorder (HCC)    Depression    Hypertension    Migraine    Nasal congestion 05/10/2023   Sore throat 05/10/2023   Stroke (HCC) 2016   2 strokes  A little weaker on right side and 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   Thyroid  disease    Past Surgical History:  Procedure Laterality Date   CESAREAN SECTION  2005   CESAREAN SECTION  2006   CHOLECYSTECTOMY  2014   COLONOSCOPY WITH PROPOFOL  N/A 03/01/2020   Procedure: COLONOSCOPY WITH PROPOFOL ;  Surgeon: Shaaron Lamar HERO, MD;  Location[facility];  Service: Endoscopy;  Laterality: N/A;  2:00pm   THYROIDECTOMY  2013   TOTAL ABDOMINAL HYSTERECTOMY  2010   TOTAL SHOULDER ARTHROPLASTY Right 01/21/2024   Procedure: ARTHROPLASTY, SHOULDER, TOTAL;  Surgeon: Onesimo Oneil LABOR, MD;  Location: [facility];  Service: Orthopedics;  Laterality: Right;   Patient Active Problem List   Diagnosis Date Noted   Mixed hyperlipidemia 03/28/2024   Elevated liver enzymes 03/28/2024   Arthritis of right glenohumeral joint 01/21/2024   Morbid obesity (HCC) 02/01/2023   Unilateral primary osteoarthritis, right knee 03/21/2022   History of stroke 03/31/2021   Other fatigue 05/10/2020   Paresthesia 05/10/2020   Primary hypertension 03/24/2020   Bipolar 1 disorder (HCC)    Depression    Asthma    Postoperative hypothyroidism 06/26/2014    PCP: Joesph Annabella HERO, FNP   REFERRING PROVIDER: Onesimo Oneil LABOR, MD   REFERRING DIAG: [phone] (ICD-10-CM) - Status post total shoulder arthroplasty, right   Rationale for Evaluation and Treatment:  Rehabiliation  THERAPY DIAG:  Acute pain of right shoulder  Muscle weakness (generalized)  ONSET DATE: DOS 01/21/24   SUBJECTIVE:  SUBJECTIVE STATEMENT: Pt reports increased stress -- son's school is under lock down again. States that what we did last week helped a lot. Worked a lot this weekend doing a lot of outdoor work. Will see ortho 04/18/24.   PERTINENT HISTORY:  See above PMH  PAIN:  NPRS scale: 8/10 Pain location:right shoulder front and back Pain description: constant, sharp, burning Aggravating factors: touching it, moving it Relieving factors: recliner and resting it on pillow. , She does ice but says it does not help.   PRECAUTIONS: ,  Shoulder post op  RED FLAGS: None   WEIGHT BEARING RESTRICTIONS:  No  FALLS:  Has patient fallen in last 6 months? No   OCCUPATION:  Logistics, computer work   PLOF:  Independent  PATIENT GOALS:  Get arm back to normal  OBJECTIVE:  Note: Objective measures were completed at Evaluation unless otherwise noted.  PATIENT SURVEYS:  Patient-Specific Activity Scoring Scheme  0 represents "unable to perform." 10 represents "able to perform at prior level. 0 1 2 3 4 5 6 7 8 9  10 (Date and Score)   Activity Eval     1. Lifting with right arm  0    2. bathing 3    3. Household chores 2   4.    5.    Score 1.66/10    Total score = sum of the activity scores/number of activities Minimum detectable change (90%CI) for average score = 2 points Minimum detectable change (90%CI) for single activity score = 3 points   Shoulder   Hand dominance:  Left  UPPER EXTREMITY ROM:  Passive ROM Right eval Right 02/19/24 Right 03/10/24  Shoulder flexion 70 90 100  Shoulder extension     Shoulder abduction 50 80 90  Shoulder adduction     Shoulder extension     Shoulder internal rotation     Shoulder external rotation 20 40 50  Elbow flexion     Elbow extension     Wrist flexion     Wrist extension     Wrist ulnar deviation     Wrist radial deviation     Wrist pronation     Wrist supination      (Blank rows = not tested)   UPPER EXTREMITY MMT:  MMT Right eval Right 03/10/24  Shoulder flexion 1 2  Shoulder extension    Shoulder abduction 1 2  Shoulder adduction    Shoulder extension    Shoulder internal rotation 3 3  Shoulder external rotation 2 3  Middle trapezius    Lower trapezius    Elbow flexion    Elbow extension    Wrist flexion    Wrist extension    Wrist ulnar deviation    Wrist radial deviation    Wrist pronation    Wrist supination    Grip strength     (Blank rows = not tested)  TREATMENT DATE: 04/07/24 Therex: Supine pec stretch with arm out in horizontal abd 2x30 Supine serratus punch with PT assist as needed x10 Supine shoulder flexion AAROM x5 Supine shoulder flexion eccentrics x2 Supine shoulder flexion AROM x2 (max) Sidelying shoulder abd eccentrics x5 Sidelying shoulder abd AROM to 90 deg x1  Nmed: Supine shoulder flexed 90 deg rhythmic stabilization Sidelying shoulder abd 90 deg rhythmic stabiliation Sidelying scapular clock x10 each direction 2 I strips for rotator cuff stability  Manual therapy: Gentle GH joint mobs grade II in all directions using pillowcase Scapular mobilizations Scar taping with Ktape   04/02/24 Therex: Supine pec stretch with arm out in horizontal abd x30 Supine serratus punch with PT assist as needed 2x10 Supine scap squeeze  with arm up in air 2x10 Supine shoulder flexion eccentrics x5 Sidelying shoulder ER eccentrics x5 Sidelying FER to top of head and down to neck x3 Sidelying shoulder abd eccentrics x 3 with mod PT assist Standing pendulums  Nmed: Supine shoulder flexed 90 deg rhythmic stabilization Sidelying scapular clock x10 each direction  Manual therapy: Gentle GH joint mobs grade II in all directions Scapular mobilizations   03/31/24 Therex: Nustep L3 x 5 min UEs Seated thoracic extension with low shoulder horizontal abd 2x10 Seated UT stretch x 30 Seated levator scap stretch x 30 Standing pendulums CW & CCW x20 each Attempted shoulder flexion wall slide but pt with limited tolerance  Nmed: Standing finger wall ladder flexion and then scaption x5 each working on keeping UT from [employer] scapular clock x10 each direction (seated for pt's knees) Wall push up plus seated x10 Seated shoulder ER with towel under arm x10  Therapeutic Activity: Standing row yellow TB x10 Standing shouder extensions yellow X 10 reps  Manual therapy: STM & TPR R pec minor/major, UT Scar massage Scapular mobilizations     PATIENT EDUCATION: Education details: HEP, PT plan of care, selfcare Person educated: Patient Education method: Explanation, Demonstration, Verbal cues, and Handouts Education comprehension: verbalized understanding, further education recommended   HOME EXERCISE PROGRAM: Access Code: [email or web address] URL: [email or web address] Date: 02/22/2024 Prepared by: Redell Moose  Exercises - Seated Scapular Retraction  - 3 x daily - 6 x weekly - 1 sets - 10 reps - Circular Shoulder Pendulum with Table Support  - 3 x daily - 6 x weekly - 1 sets - 15 reps - Seated Shoulder Flexion Towel Slide at Table Top  - 3 x daily - 6 x weekly - 1 sets - 10 reps - Seated Shoulder Abduction Towel Slide at Table Top (Mirrored)  - 3 x daily - 6 x weekly - 1 sets - 10 reps - 5 hold -  Seated Shoulder External Rotation PROM on Table (Mirrored)  - 2-3 x daily - 6 x weekly - 1 sets - 10 reps - 5 hold - Supine Shoulder Flexion Extension AAROM with Dowel  - 2 x daily - 6 x weekly - 1 sets - 10 reps - Supine Shoulder External Rotation in 45 Degrees Abduction AAROM with Dowel (Mirrored)  - 2 x daily - 6 x weekly - 1 sets - 10 reps - Supine Shoulder Abduction AAROM with Dowel (Mirrored)  - 2 x daily - 6 x weekly - 1 sets - 10 reps - Seated AAROM Shoulder Flexion  - 2 x daily - 6 x weekly - 2 sets - 10 reps -Added on 03/10/24 active shoulder flexion, active shoulder abduction, standing rows and standing shoulder extensions.   ASSESSMENT:  CLINICAL IMPRESSION:  Pt able to perform shoulder AROM in flexion and abd to 90 deg in supine and sidelying respectively today without PT assist. Pt is demonstrating increase in rotator cuff strength. Still most sore along mid humerus and along biceps muscle belly/tendon. Added ktape today to help stretch pt's scar and to provide more stability in pt's shoulder.   OBJECTIVE IMPAIRMENTS: decreased activity tolerance for ADL's,, decreased endurance, decreased mobility, decreased ROM, decreased strength, impaired flexibility, impaired UE use, and pain.  ACTIVITY LIMITATIONSlifting, carry, reaching, cleaning,  driving, and or occupation  PERSONAL FACTORS: see above PMH  also affecting patient's functional outcome.  REHAB POTENTIAL: Good  CLINICAL DECISION MAKING: Stable/uncomplicated  EVALUATION COMPLEXITY: Low    GOALS: Short term PT Goals Target date: 02/29/2024   Pt will be I and compliant with HEP. Baseline:  Goal status: MET 02/11/24 Pt will decrease pain by 25% overall with arm movements Baseline: Goal status: ongoing 03/10/24  Long term PT goals Target date:04/25/2024   Pt will improve Rt sholder AROM to [facility] to improve functional mobility Baseline: Goal status: ongoing 03/10/24 Pt will improve  strength to at least 4+/5 MMT to improve  functional strength Baseline: Goal status: ongoing 03/10/24 Pt will improve Patient specific functional scale (PSFS) to at least 7/10 to show improved function level Baseline: Goal status: ongoing 03/10/24 Pt will reduce pain to overall less than 3/10 with usual activity and work activity. Baseline: Goal status: ongoing 03/10/24  PLAN: PT FREQUENCY: 1-3 times per week   PT DURATION: 8-12 weeks  PLANNED INTERVENTIONS (unless contraindicated):  97110-Therapeutic exercises, 97530- Therapeutic activity, [id number]- Neuromuscular re-education, 97535- Self Care, 02859- Manual therapy, G0283- Electrical stimulation (unattended), 97016- Vasopneumatic device, and [id number]- Ultrasound  PLAN FOR NEXT SESSION: How was ktape? Continue to progress strengthening as tolerated Progress ROM as tolerated No lifting >5 lbs No IR/backwards extension  NEXT MD VISIT: 04/18/24  Orian Amberg April Ma L Marvis Bakken, PT,DPT 04/07/2024, 4:06 PM

## 2024-04-08 ENCOUNTER — Ambulatory Visit (HOSPITAL_COMMUNITY)
Admission: RE | Admit: 2024-04-08 | Discharge: 2024-04-08 | Disposition: A | Discharge status: Home / Self Care | Source: Ambulatory Visit | Attending: Family Medicine | Admitting: Family Medicine

## 2024-04-08 DIAGNOSIS — R748 Abnormal levels of other serum enzymes: Secondary | ICD-10-CM | POA: Insufficient documentation

## 2024-04-08 DIAGNOSIS — K7689 Other specified diseases of liver: Secondary | ICD-10-CM | POA: Diagnosis not present

## 2024-04-08 DIAGNOSIS — Z9049 Acquired absence of other specified parts of digestive tract: Secondary | ICD-10-CM | POA: Diagnosis not present

## 2024-04-09 ENCOUNTER — Encounter: Payer: Self-pay | Admitting: Physical Therapy

## 2024-04-09 ENCOUNTER — Ambulatory Visit: Admitting: Physical Therapy

## 2024-04-09 DIAGNOSIS — M6281 Muscle weakness (generalized): Secondary | ICD-10-CM

## 2024-04-09 DIAGNOSIS — M25511 Pain in right shoulder: Secondary | ICD-10-CM | POA: Diagnosis not present

## 2024-04-09 NOTE — Therapy (Addendum)
 OUTPATIENT PHYSICAL THERAPY TREATMENT   Patient Name: Belinda Turner MRN: [id number] DOB:Oct 18, 1965, 58 y.o., female Today's Date: 04/09/2024  END OF SESSION:  PT End of Session - 04/09/24 1253     Visit Number 14    Number of Visits 15    Date for PT Re-Evaluation 04/25/24    PT Start Time 1300    PT Stop Time 1340    PT Time Calculation (min) 40 min    Activity Tolerance Patient tolerated treatment well    Behavior During Therapy [facility] for tasks assessed/performed            Past Medical History:  Diagnosis Date   Acute non-recurrent ethmoidal sinusitis 05/10/2023   Asthma    Bipolar 1 disorder (HCC)    Depression    Hypertension    Migraine    Nasal congestion 05/10/2023   Sore throat 05/10/2023   Stroke (HCC) 2016   2 strokes  A little weaker on right side and 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   Thyroid  disease    Past Surgical History:  Procedure Laterality Date   CESAREAN SECTION  2005   CESAREAN SECTION  2006   CHOLECYSTECTOMY  2014   COLONOSCOPY WITH PROPOFOL  N/A 03/01/2020   Procedure: COLONOSCOPY WITH PROPOFOL ;  Surgeon: Shaaron Lamar HERO, MD;  Location: [facility];  Service: Endoscopy;  Laterality: N/A;  2:00pm   THYROIDECTOMY  2013   TOTAL ABDOMINAL HYSTERECTOMY  2010   TOTAL SHOULDER ARTHROPLASTY Right 01/21/2024   Procedure: ARTHROPLASTY, SHOULDER, TOTAL;  Surgeon: Onesimo Oneil LABOR, MD;  Location: [facility];  Service: Orthopedics;  Laterality: Right;   Patient Active Problem List   Diagnosis Date Noted   Mixed hyperlipidemia 03/28/2024   Elevated liver enzymes 03/28/2024   Arthritis of right glenohumeral joint 01/21/2024   Morbid obesity (HCC) 02/01/2023   Unilateral primary osteoarthritis, right knee 03/21/2022   History of stroke 03/31/2021   Other fatigue 05/10/2020   Paresthesia 05/10/2020   Primary hypertension 03/24/2020   Bipolar 1 disorder (HCC)    Depression    Asthma    Postoperative hypothyroidism 06/26/2014    PCP: Joesph Annabella HERO, FNP   REFERRING PROVIDER: Onesimo Oneil LABOR, MD   REFERRING DIAG: [phone] (ICD-10-CM) - Status post total shoulder arthroplasty, right   Rationale for Evaluation and Treatment:  Rehabiliation  THERAPY DIAG:  Acute pain of right shoulder  Muscle weakness (generalized)  ONSET DATE: DOS 01/21/24   SUBJECTIVE:  SUBJECTIVE STATEMENT: Pt states she had to walk up/down 2 flights of steps today.   PERTINENT HISTORY:  See above PMH  PAIN:  NPRS scale: 8/10 Pain location:right shoulder front and back Pain description: constant, sharp, burning Aggravating factors: touching it, moving it Relieving factors: recliner and resting it on pillow. , She does ice but says it does not help.   PRECAUTIONS: ,  Shoulder post op  RED FLAGS: None   WEIGHT BEARING RESTRICTIONS:  No  FALLS:  Has patient fallen in last 6 months? No   OCCUPATION:  Logistics, computer work   PLOF:  Independent  PATIENT GOALS:  Get arm back to normal  OBJECTIVE:  Note: Objective measures were completed at Evaluation unless otherwise noted.  PATIENT SURVEYS:  Patient-Specific Activity Scoring Scheme  0 represents "unable to perform." 10 represents "able to perform at prior level. 0 1 2 3 4 5 6 7 8 9  10 (Date and Score)   Activity Eval     1. Lifting with right arm  0    2. bathing 3    3. Household chores 2   4.    5.    Score 1.66/10    Total score = sum of the activity scores/number of activities Minimum detectable change (90%CI) for average score = 2 points Minimum detectable change (90%CI) for single activity score = 3 points   Shoulder   Hand dominance: Left  UPPER EXTREMITY ROM:  Passive ROM Right eval Right 02/19/24 Right 03/10/24 Right 04/09/24  Shoulder flexion 70 90 100 A: 112  sup  Shoulder extension      Shoulder abduction 50 80 90 A: 95 sup  Shoulder adduction      Shoulder extension      Shoulder internal rotation      Shoulder external rotation 20 40 50 A: 60 sup  Elbow flexion      Elbow extension      Wrist flexion      Wrist extension      Wrist ulnar deviation      Wrist radial deviation      Wrist pronation      Wrist supination       (Blank rows = not tested)   UPPER EXTREMITY MMT:  MMT Right eval Right 03/10/24  Shoulder flexion 1 2  Shoulder extension    Shoulder abduction 1 2  Shoulder adduction    Shoulder extension    Shoulder internal rotation 3 3  Shoulder external rotation 2 3  Middle trapezius    Lower trapezius    Elbow flexion    Elbow extension    Wrist flexion    Wrist extension    Wrist ulnar deviation    Wrist radial deviation    Wrist pronation    Wrist supination    Grip strength     (Blank rows = not tested)  TREATMENT DATE: 04/09/24 Therex: Supine pec stretch with arm out in horizontal abd 2x30 Supine shoulder PROM all directions within protocol Supine serratus punch with PT assist as needed x10 Supine shoulder flexion eccentrics x5 Supine shoulder flexion AROM x5 (to fatigue) Supine shoulder ER yellow TB x10 Sidelying shoulder abd eccentrics x5 Sidelying shoulder abd AROM x5 (to fatigue)  Therapeutic Activity: Row red TB 2x10 Low horizontal shoulder abd red TB x2 limited due to pt's R UE cramping  Manual therapy: Gentle GH joint mobs grade II in all directions using pillowcase Scapular mobilizations   04/07/24 Therex: Supine pec stretch with arm out in horizontal abd 2x30 Supine serratus punch with PT assist as needed x10 Supine shoulder flexion AAROM x5 Supine shoulder flexion eccentrics x2 Supine shoulder flexion AROM x2 (max) Sidelying shoulder abd eccentrics  x5 Sidelying shoulder abd AROM to 90 deg x1  Nmed: Supine shoulder flexed 90 deg rhythmic stabilization Sidelying shoulder abd 90 deg rhythmic stabiliation Sidelying scapular clock x10 each direction 2 I strips for rotator cuff stability  Manual therapy: Gentle GH joint mobs grade II in all directions using pillowcase Scapular mobilizations Scar taping with Ktape       PATIENT EDUCATION: Education details: HEP, PT plan of care, selfcare Person educated: Patient Education method: Explanation, Demonstration, Verbal cues, and Handouts Education comprehension: verbalized understanding, further education recommended   HOME EXERCISE PROGRAM: Access Code: [email or web address] URL: [email or web address] Date: 02/22/2024 Prepared by: Redell Moose  Exercises - Seated Scapular Retraction  - 3 x daily - 6 x weekly - 1 sets - 10 reps - Circular Shoulder Pendulum with Table Support  - 3 x daily - 6 x weekly - 1 sets - 15 reps - Seated Shoulder Flexion Towel Slide at Table Top  - 3 x daily - 6 x weekly - 1 sets - 10 reps - Seated Shoulder Abduction Towel Slide at Table Top (Mirrored)  - 3 x daily - 6 x weekly - 1 sets - 10 reps - 5 hold - Seated Shoulder External Rotation PROM on Table (Mirrored)  - 2-3 x daily - 6 x weekly - 1 sets - 10 reps - 5 hold - Supine Shoulder Flexion Extension AAROM with Dowel  - 2 x daily - 6 x weekly - 1 sets - 10 reps - Supine Shoulder External Rotation in 45 Degrees Abduction AAROM with Dowel (Mirrored)  - 2 x daily - 6 x weekly - 1 sets - 10 reps - Supine Shoulder Abduction AAROM with Dowel (Mirrored)  - 2 x daily - 6 x weekly - 1 sets - 10 reps - Seated AAROM Shoulder Flexion  - 2 x daily - 6 x weekly - 2 sets - 10 reps -Added on 03/10/24 active shoulder flexion, active shoulder abduction, standing rows and standing shoulder extensions.   ASSESSMENT:  CLINICAL IMPRESSION: Continues to demonstrate improving shoulder strength and endurance. Able to  perform 5 reps of shoulder AROM in supine without PT assist before fatigue now. Still not able to perform in sitting/against gravity but overall improving. Working on increasing scapular strength this session as well with rows. Pt continues to benefit from PT for strengthening. K tape appears to have aggravated pt's skin. Scar tissue remains highly sensitive, thick and taut. Very tender to palpation. Encouraged pt to keep on performing self scar massage at home.   OBJECTIVE IMPAIRMENTS: decreased activity tolerance for ADL's,, decreased endurance, decreased mobility, decreased ROM, decreased strength, impaired flexibility, impaired UE use, and pain.  ACTIVITY LIMITATIONSlifting, carry,  reaching, cleaning,  driving, and or occupation  PERSONAL FACTORS: see above PMH  also affecting patient's functional outcome.  REHAB POTENTIAL: Good  CLINICAL DECISION MAKING: Stable/uncomplicated  EVALUATION COMPLEXITY: Low    GOALS: Short term PT Goals Target date: 02/29/2024   Pt will be I and compliant with HEP. Baseline:  Goal status: MET 02/11/24 Pt will decrease pain by 25% overall with arm movements Baseline: Goal status: ongoing 03/10/24  Long term PT goals Target date:04/25/2024   Pt will improve Rt sholder AROM to [facility] to improve functional mobility Baseline: Goal status: ongoing 03/10/24 Pt will improve  strength to at least 4+/5 MMT to improve functional strength Baseline: Goal status: ongoing 03/10/24 Pt will improve Patient specific functional scale (PSFS) to at least 7/10 to show improved function level Baseline: Goal status: ongoing 03/10/24 Pt will reduce pain to overall less than 3/10 with usual activity and work activity. Baseline: Goal status: ongoing 03/10/24  PLAN: PT FREQUENCY: 1-3 times per week   PT DURATION: 8-12 weeks  PLANNED INTERVENTIONS (unless contraindicated):  97110-Therapeutic exercises, 97530- Therapeutic activity, [id number]- Neuromuscular re-education, 97535-  Self Care, 02859- Manual therapy, G0283- Electrical stimulation (unattended), 97016- Vasopneumatic device, and [id number]- Ultrasound  PLAN FOR NEXT SESSION: Will see Dr. Onesimo 04/15/24. Continue to progress strengthening as tolerated Progress ROM as tolerated No lifting >5 lbs No IR/backwards extension  NEXT MD VISIT: 04/15/24  Edynn Gillock April Ma L Cote Mayabb, PT, DPT 04/09/2024, 12:54 PM

## 2024-04-13 ENCOUNTER — Encounter: Payer: Self-pay | Admitting: Family Medicine

## 2024-04-14 ENCOUNTER — Encounter: Payer: Self-pay | Admitting: Physical Therapy

## 2024-04-14 ENCOUNTER — Ambulatory Visit: Admitting: Physical Therapy

## 2024-04-14 DIAGNOSIS — M25511 Pain in right shoulder: Secondary | ICD-10-CM | POA: Diagnosis not present

## 2024-04-14 DIAGNOSIS — M6281 Muscle weakness (generalized): Secondary | ICD-10-CM | POA: Diagnosis not present

## 2024-04-14 NOTE — Therapy (Signed)
 OUTPATIENT PHYSICAL THERAPY TREATMENT   Patient Name: Belinda Turner MRN: [id number] DOB:12-07-1965, 58 y.o., female Today's Date: 04/14/2024  END OF SESSION:  PT End of Session - 04/14/24 0839     Visit Number 15    Number of Visits 15    Date for Recertification  04/25/24    PT Start Time 0800    PT Stop Time 0840    PT Time Calculation (min) 40 min    Activity Tolerance Patient tolerated treatment well    Behavior During Therapy [facility] for tasks assessed/performed             Past Medical History:  Diagnosis Date   Acute non-recurrent ethmoidal sinusitis 05/10/2023   Asthma    Bipolar 1 disorder (HCC)    Depression    Hypertension    Migraine    Nasal congestion 05/10/2023   Sore throat 05/10/2023   Stroke (HCC) 2016   2 strokes  A little weaker on right side and 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   Thyroid  disease    Past Surgical History:  Procedure Laterality Date   CESAREAN SECTION  2005   CESAREAN SECTION  2006   CHOLECYSTECTOMY  2014   COLONOSCOPY WITH PROPOFOL  N/A 03/01/2020   Procedure: COLONOSCOPY WITH PROPOFOL ;  Surgeon: Shaaron Lamar HERO, MD;  Location[facility];  Service: Endoscopy;  Laterality: N/A;  2:00pm   THYROIDECTOMY  2013   TOTAL ABDOMINAL HYSTERECTOMY  2010   TOTAL SHOULDER ARTHROPLASTY Right 01/21/2024   Procedure: ARTHROPLASTY, SHOULDER, TOTAL;  Surgeon: Onesimo Oneil LABOR, MD;  Location: [facility];  Service: Orthopedics;  Laterality: Right;   Patient Active Problem List   Diagnosis Date Noted   Mixed hyperlipidemia 03/28/2024   Elevated liver enzymes 03/28/2024   Arthritis of right glenohumeral joint 01/21/2024   Morbid obesity (HCC) 02/01/2023   Unilateral primary osteoarthritis, right knee 03/21/2022   History of stroke 03/31/2021   Other fatigue 05/10/2020   Paresthesia 05/10/2020   Primary hypertension 03/24/2020   Bipolar 1 disorder (HCC)    Depression    Asthma    Postoperative hypothyroidism 06/26/2014    PCP: Joesph Annabella HERO, FNP   REFERRING PROVIDER: Joesph Annabella HERO, FNP   REFERRING DIAG: [id number] (ICD-10-CM) - Status post total shoulder arthroplasty, right   Rationale for Evaluation and Treatment:  Rehabiliation  THERAPY DIAG:  Acute pain of right shoulder  Muscle weakness (generalized)  ONSET DATE: DOS 01/21/24   SUBJECTIVE:  SUBJECTIVE STATEMENT: Pt states she will see MD tommorow. Her shoulder is improving she feels but her knee has become bad and feels she needs to have it replaced. She plans to come to one more PT visit for exercise progressions then plans to transition to independent program.   PERTINENT HISTORY:  See above PMH  PAIN:  NPRS scale: 6/10 Pain location:right shoulder front and back Pain description: constant, sharp, burning Aggravating factors: touching it, moving it Relieving factors: recliner and resting it on pillow. , She does ice but says it does not help.   PRECAUTIONS: ,  Shoulder post op  RED FLAGS: None   WEIGHT BEARING RESTRICTIONS:  No  FALLS:  Has patient fallen in last 6 months? No   OCCUPATION:  Logistics, computer work   PLOF:  Independent  PATIENT GOALS:  Get arm back to normal  OBJECTIVE:  Note: Objective measures were completed at Evaluation unless otherwise noted.  PATIENT SURVEYS:  Patient-Specific Activity Scoring Scheme  0 represents "unable to perform." 10 represents "able to perform at prior level. 0 1 2 3 4 5 6 7 8 9  10 (Date and Score)   Activity Eval    1. Lifting with right arm  0    2. bathing 3    3. Household chores 2   4.    5.    Score 1.66/10    Total score = sum of the activity scores/number of activities Minimum detectable change (90%CI) for average score = 2 points Minimum detectable change (90%CI) for single  activity score = 3 points   Shoulder   Hand dominance: Left  UPPER EXTREMITY ROM:  Passive ROM Right eval Right 02/19/24 Right 03/10/24 Right 04/09/24 Right 04/14/24  Shoulder flexion 70 90 100 A: 112 sup A: 90 standing A:140 supine AAROM: 135 sitting with cane   Shoulder extension       Shoulder abduction 50 80 90 A: 95 sup A: 70 standing AAROM: 90 A: 130 sidelying   Shoulder adduction       Shoulder extension       Shoulder internal rotation     A:  Shoulder external rotation 20 40 50 A: 60 sup A: WFL  Elbow flexion       Elbow extension       Wrist flexion       Wrist extension       Wrist ulnar deviation       Wrist radial deviation       Wrist pronation       Wrist supination        (Blank rows = not tested)   UPPER EXTREMITY MMT:  MMT Right eval Right 03/10/24 Right 04/14/24  Shoulder flexion 1 2 4   Shoulder extension     Shoulder abduction 1 2 3   Shoulder adduction     Shoulder extension     Shoulder internal rotation 3 3 4   Shoulder external rotation 2 3 4   Middle trapezius     Lower trapezius     Elbow flexion     Elbow extension     Wrist flexion     Wrist extension     Wrist ulnar deviation     Wrist radial deviation     Wrist pronation     Wrist supination     Grip strength      (Blank rows = not tested)  TREATMENT DATE: 04/14/24 Therex: Supine horizontal abd/add X 8 reps Supine serratus punch x10 Supine shoulder flexion AROM 2X 5 reps Supine shoulder circles at 90 deg flexion, X 15 CW,CCW Sidelying shoulder abd AROM x5 (to fatigue)  Therapeutic Activity: UBE 2 min forward, 2 min backwards Row red TB red X 10 Shoulder extensions red TB X 10 Chest press red X 10  04/09/24 Therex: Supine pec stretch with arm out in horizontal abd 2x30 Supine shoulder PROM all directions within protocol Supine serratus  punch with PT assist as needed x10 Supine shoulder flexion eccentrics x5 Supine shoulder flexion AROM x5 (to fatigue) Supine shoulder ER yellow TB x10 Sidelying shoulder abd eccentrics x5 Sidelying shoulder abd AROM x5 (to fatigue)  Therapeutic Activity: Row red TB 2x10 Low horizontal shoulder abd red TB x2 limited due to pt's R UE cramping  Manual therapy: Gentle GH joint mobs grade II in all directions using pillowcase Scapular mobilizations   04/07/24 Therex: Supine pec stretch with arm out in horizontal abd 2x30 Supine serratus punch with PT assist as needed x10 Supine shoulder flexion AAROM x5 Supine shoulder flexion eccentrics x2 Supine shoulder flexion AROM x2 (max) Sidelying shoulder abd eccentrics x5 Sidelying shoulder abd AROM to 90 deg x1  Nmed: Supine shoulder flexed 90 deg rhythmic stabilization Sidelying shoulder abd 90 deg rhythmic stabiliation Sidelying scapular clock x10 each direction 2 I strips for rotator cuff stability  Manual therapy: Gentle GH joint mobs grade II in all directions using pillowcase Scapular mobilizations Scar taping with Ktape       PATIENT EDUCATION: Education details: HEP, PT plan of care, selfcare Person educated: Patient Education method: Explanation, Demonstration, Verbal cues, and Handouts Education comprehension: verbalized understanding, further education recommended   HOME EXERCISE PROGRAM: Access Code: [email or web address] URL: [email or web address] Date: 02/22/2024 Prepared by: Redell Moose  Exercises - Seated Scapular Retraction  - 3 x daily - 6 x weekly - 1 sets - 10 reps - Circular Shoulder Pendulum with Table Support  - 3 x daily - 6 x weekly - 1 sets - 15 reps - Seated Shoulder Flexion Towel Slide at Table Top  - 3 x daily - 6 x weekly - 1 sets - 10 reps - Seated Shoulder Abduction Towel Slide at Table Top (Mirrored)  - 3 x daily - 6 x weekly - 1 sets - 10 reps - 5 hold - Seated Shoulder External  Rotation PROM on Table (Mirrored)  - 2-3 x daily - 6 x weekly - 1 sets - 10 reps - 5 hold - Supine Shoulder Flexion Extension AAROM with Dowel  - 2 x daily - 6 x weekly - 1 sets - 10 reps - Supine Shoulder External Rotation in 45 Degrees Abduction AAROM with Dowel (Mirrored)  - 2 x daily - 6 x weekly - 1 sets - 10 reps - Supine Shoulder Abduction AAROM with Dowel (Mirrored)  - 2 x daily - 6 x weekly - 1 sets - 10 reps - Seated AAROM Shoulder Flexion  - 2 x daily - 6 x weekly - 2 sets - 10 reps -Added on 03/10/24 active shoulder flexion, active shoulder abduction, standing rows and standing shoulder extensions.   ASSESSMENT:  CLINICAL IMPRESSION: Measurements showed good progress since I saw her last. She does still have some expected weakness in her shoulder that we are working hard to improve.  She plans to come to one more PT visit for exercise progressions then plans to transition to independent program.   OBJECTIVE  IMPAIRMENTS: decreased activity tolerance for ADL's,, decreased endurance, decreased mobility, decreased ROM, decreased strength, impaired flexibility, impaired UE use, and pain.  ACTIVITY LIMITATIONSlifting, carry, reaching, cleaning,  driving, and or occupation  PERSONAL FACTORS: see above PMH  also affecting patient's functional outcome.  REHAB POTENTIAL: Good  CLINICAL DECISION MAKING: Stable/uncomplicated  EVALUATION COMPLEXITY: Low    GOALS: Short term PT Goals Target date: 02/29/2024   Pt will be I and compliant with HEP. Baseline:  Goal status: MET 02/11/24 Pt will decrease pain by 25% overall with arm movements Baseline: Goal status: MET 04/14/24  Long term PT goals Target date:04/25/2024   Pt will improve Rt sholder AROM to [facility] to improve functional mobility Baseline: Goal status: ongoing 04/14/24 Pt will improve  strength to at least 4+/5 MMT to improve functional strength Baseline: Goal status: ongoing 9/22/5 Pt will improve Patient specific functional  scale (PSFS) to at least 7/10 to show improved function level Baseline: Goal status: ongoing 04/14/24 Pt will reduce pain to overall less than 3/10 with usual activity and work activity. Baseline: Goal status: ongoing 04/14/24  PLAN: PT FREQUENCY: 1-3 times per week   PT DURATION: 8-12 weeks  PLANNED INTERVENTIONS (unless contraindicated):  97110-Therapeutic exercises, 97530- Therapeutic activity, [id number]- Neuromuscular re-education, 97535- Self Care, 02859- Manual therapy, G0283- Electrical stimulation (unattended), 97016- Vasopneumatic device, and 02964- Ultrasound  PLAN FOR NEXT SESSION: What did MD say, do PSFS and give HEP progressions for DC NEXT MD VISIT: 04/15/24  Redell JONELLE Moose, PT, DPT 04/14/2024, 8:41 AM

## 2024-04-14 NOTE — Telephone Encounter (Signed)
 Does pt need a referral to GI for further evaluation? After result from RUQ ultrasound

## 2024-04-15 ENCOUNTER — Ambulatory Visit (INDEPENDENT_AMBULATORY_CARE_PROVIDER_SITE_OTHER): Admitting: Orthopedic Surgery

## 2024-04-15 ENCOUNTER — Other Ambulatory Visit (INDEPENDENT_AMBULATORY_CARE_PROVIDER_SITE_OTHER): Payer: Self-pay

## 2024-04-15 ENCOUNTER — Encounter: Payer: Self-pay | Admitting: Orthopedic Surgery

## 2024-04-15 DIAGNOSIS — Z96611 Presence of right artificial shoulder joint: Secondary | ICD-10-CM | POA: Diagnosis not present

## 2024-04-15 DIAGNOSIS — M7051 Other bursitis of knee, right knee: Secondary | ICD-10-CM

## 2024-04-15 NOTE — Patient Instructions (Signed)

## 2024-04-15 NOTE — Progress Notes (Signed)
 Orthopaedic Postop Note  Assessment: Belinda Turner is a 58 y.o. female s/p Right Anatomic Total Shoulder Arthroplasty  DOS: 01/21/2024  Right Pes anserine bursitis  Plan: Belinda Turner is advancing appropriately.  Her pain is much better in the right shoulder.  No issues with the surgical incision.  She is lacking some function overall, but this should improve with strengthening.  Continue to work with therapy, and then home exercises.  Follow-up in approximately 3 months.  Regarding the pain in her right knee, this is more consistent with pes anserine bursitis.  We discussed the injection.  She would like to proceed.  This was completed in clinic.  Continue with medications as needed.  Procedure note injection Right knee joint   Verbal consent was obtained to inject the right knee joint  Timeout was completed to confirm the site of injection.  The skin was prepped with alcohol and ethyl chloride was sprayed at the injection site.  A 21-gauge needle was used to inject 40 mg of Depo-Medrol  and 1% lidocaine  (4 cc) into the right knee using an anterolateral approach.  There were no complications. A sterile bandage was applied.   Follow-up: Return in about 3 months (around 07/15/2024).  XR at next visit: Right shoulder  Subjective:  Chief Complaint  Patient presents with   Routine Post Op    R TSA DOS 01/21/24    History of Present Illness: Belinda Turner is a 58 y.o. female who presents following the above stated procedure.  Surgery was almost 3 months ago.  She is doing well overall.  She states the pain that she is having in the right shoulder is tolerable.  Her last day of therapy is this week.  She will then work on home exercises.  She is happy with her improvements.  However, she is complaining of a lot of knee pain.  Pain is anterior and medial in the right knee.  She does have a history of multiple injections in the right knee, with limited improvement in her symptoms.   The pain in the knee keeps her awake at night.  She is wearing a brace to bed.  Review of Systems: No fevers or chills No numbness or tingling No Chest Pain No shortness of breath   Objective: There were no vitals taken for this visit.  Physical Exam:  Alert and oriented, no acute distress  Surgical incision has healed.  Mild hypertrophic scarring.  No surrounding erythema or drainage.  Sensation in the lateral shoulder is intact.  Fingers are warm and well-perfused.  Sensation tact throughout the right hand.  Active forward flexion to 90 degrees.  With some assistance, she can get above her head, and keep it there.  Abduction to 90 degrees.  She tolerates some external rotation at her side.  Negative belly press.  Right knee without deformity.  No bruising.  No redness.  She has good range of motion.  No increased laxity to varus or valgus stress.  Negative Lachman.  Tenderness over the pes anserine tendons.  IMAGING: I personally ordered and reviewed the following images:  X-rays of the right shoulder were obtained in clinic today.  These are compared to prior x-rays.  No change in overall alignment and position of the prosthesis.  No lucency.  No subsidence.  Marker for the glenoid remains in stable alignment.  No evidence of proximal humeral migration.  No bony lesions.  Impression: Stable right shoulder arthroplasty   Oneil [name], MD  04/15/2024 9:23 AM

## 2024-04-16 ENCOUNTER — Encounter: Payer: Self-pay | Admitting: Physical Therapy

## 2024-04-16 ENCOUNTER — Ambulatory Visit: Admitting: Physical Therapy

## 2024-04-16 DIAGNOSIS — M25511 Pain in right shoulder: Secondary | ICD-10-CM

## 2024-04-16 DIAGNOSIS — M6281 Muscle weakness (generalized): Secondary | ICD-10-CM

## 2024-04-16 NOTE — Therapy (Signed)
 OUTPATIENT PHYSICAL THERAPY TREATMENT/DISCHARGE PHYSICAL THERAPY DISCHARGE SUMMARY  Visits from Start of Care: 16  Current functional level related to goals / functional outcomes: See below   Remaining deficits: See below   Education / Equipment: HEP  Plan:  Patient goals were some met. Patient is being discharged as she feels ready to finish PT and transition to independent program with exercise progressions.       Patient Name: Belinda Turner MRN: [id number] DOB:01-31-1966, 58 y.o., female Today's Date: 04/16/2024  END OF SESSION:  PT End of Session - 04/16/24 0859     Visit Number 16    Date for Recertification  04/25/24    PT Start Time 0800    PT Stop Time 0838    PT Time Calculation (min) 38 min    Activity Tolerance Patient tolerated treatment well    Behavior During Therapy [facility] for tasks assessed/performed              Past Medical History:  Diagnosis Date   Acute non-recurrent ethmoidal sinusitis 05/10/2023   Asthma    Bipolar 1 disorder (HCC)    Depression    Hypertension    Migraine    Nasal congestion 05/10/2023   Sore throat 05/10/2023   Stroke (HCC) 2016   2 strokes  A little weaker on right side and 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   Thyroid  disease    Past Surgical History:  Procedure Laterality Date   CESAREAN SECTION  2005   CESAREAN SECTION  2006   CHOLECYSTECTOMY  2014   COLONOSCOPY WITH PROPOFOL  N/A 03/01/2020   Procedure: COLONOSCOPY WITH PROPOFOL ;  Surgeon: Shaaron Lamar HERO, MD;  Location: [facility];  Service: Endoscopy;  Laterality: N/A;  2:00pm   THYROIDECTOMY  2013   TOTAL ABDOMINAL HYSTERECTOMY  2010   TOTAL SHOULDER ARTHROPLASTY Right 01/21/2024   Procedure: ARTHROPLASTY, SHOULDER, TOTAL;  Surgeon: Onesimo Oneil LABOR, MD;  Location: [facility] ORS;  Service: Orthopedics;  Laterality: Right;   Patient Active Problem List   Diagnosis Date Noted   Mixed hyperlipidemia 03/28/2024   Elevated liver enzymes 03/28/2024    Arthritis of right glenohumeral joint 01/21/2024   Morbid obesity (HCC) 02/01/2023   Unilateral primary osteoarthritis, right knee 03/21/2022   History of stroke 03/31/2021   Other fatigue 05/10/2020   Paresthesia 05/10/2020   Primary hypertension 03/24/2020   Bipolar 1 disorder (HCC)    Depression    Asthma    Postoperative hypothyroidism 06/26/2014    PCP: Joesph Annabella HERO, FNP   REFERRING PROVIDER: Joesph Annabella HERO, FNP   REFERRING DIAG: [phone] (ICD-10-CM) - Status post total shoulder arthroplasty, right   Rationale for Evaluation and Treatment:  Rehabiliation  THERAPY DIAG:  Acute pain of right shoulder  Muscle weakness (generalized)  ONSET DATE: DOS 01/21/24   SUBJECTIVE:  SUBJECTIVE STATEMENT: She is requesting more exercise progressions so she can finish up with PT and transition to independent program.   PERTINENT HISTORY:  See above PMH  PAIN:  NPRS scale: 6/10 Pain location:right shoulder front and back Pain description: constant, sharp, burning Aggravating factors: touching it, moving it Relieving factors: recliner and resting it on pillow. , She does ice but says it does not help.   PRECAUTIONS: ,  Shoulder post op  RED FLAGS: None   WEIGHT BEARING RESTRICTIONS:  No  FALLS:  Has patient fallen in last 6 months? No   OCCUPATION:  Logistics, computer work   PLOF:  Independent  PATIENT GOALS:  Get arm back to normal  OBJECTIVE:  Note: Objective measures were completed at Evaluation unless otherwise noted.  PATIENT SURVEYS:  Patient-Specific Activity Scoring Scheme  0 represents "unable to perform." 10 represents "able to perform at prior level. 0 1 2 3 4 5 6 7 8 9  10 (Date and Score)   Activity Eval  04/16/24  1. Lifting with right arm  0 6    2. bathing 3  8  3. Household chores 2 8  4.    5.    Score 1.66/10 7.3   Total score = sum of the activity scores/number of activities Minimum detectable change (90%CI) for average score = 2 points Minimum detectable change (90%CI) for single activity score = 3 points   Shoulder   Hand dominance: Left  UPPER EXTREMITY ROM:  Passive ROM Right eval Right 02/19/24 Right 03/10/24 Right 04/09/24 Right 04/14/24  Shoulder flexion 70 90 100 A: 112 sup A: 90 standing A:140 supine AAROM: 135 sitting with cane   Shoulder extension       Shoulder abduction 50 80 90 A: 95 sup A: 70 standing AAROM: 90 A: 130 sidelying   Shoulder adduction       Shoulder extension       Shoulder internal rotation     A:  Shoulder external rotation 20 40 50 A: 60 sup A: WFL  Elbow flexion       Elbow extension       Wrist flexion       Wrist extension       Wrist ulnar deviation       Wrist radial deviation       Wrist pronation       Wrist supination        (Blank rows = not tested)   UPPER EXTREMITY MMT:  MMT Right eval Right 03/10/24 Right 04/14/24  Shoulder flexion 1 2 4   Shoulder extension     Shoulder abduction 1 2 3   Shoulder adduction     Shoulder extension     Shoulder internal rotation 3 3 4   Shoulder external rotation 2 3 4   Middle trapezius     Lower trapezius     Elbow flexion     Elbow extension     Wrist flexion     Wrist extension     Wrist ulnar deviation     Wrist radial deviation     Wrist pronation     Wrist supination     Grip strength      (Blank rows = not tested)  TREATMENT DATE: 04/16/24 Therex: Supine ER red X 15 Supine shoulder flexion AROM 15 reps Supine shoulder circles at 90 deg flexion, X 15 CW,CCW Sidelying shoulder abd AROM x10 (to fatigue) HEP progressions created and printed for her  Therapeutic Activity: UBE 2  min forward, 2 min backwards Row red TB red X 10 Shoulder extensions red TB X 10 Chest press red X 10 Seated shoulder flexion X 10 reps Seated shoulder abd X 10 reps    PATIENT EDUCATION: Education details: HEP, PT plan of care, selfcare Person educated: Patient Education method: Explanation, Demonstration, Verbal cues, and Handouts Education comprehension: verbalized understanding, further education recommended   HOME EXERCISE PROGRAM: Access Code: [email or web address] URL: [email or web address] Date: 04/16/2024 Prepared by: Redell Moose  Exercises - Supine Shoulder Flexion Extension Full Range AROM  - 1 x daily - 7 x weekly - 2 sets - 5 reps - Supine Shoulder External Rotation with Resistance  - 1 x daily - 7 x weekly - 2 sets - 10 reps - Sidelying Shoulder Abduction Palm Forward  - 1 x daily - 7 x weekly - 2 sets - 5 reps - Standing Shoulder Row with Anchored Resistance  - 1 x daily - 7 x weekly - 2 sets - 10 reps - Shoulder extension with resistance - Neutral  - 2 x daily - 6 x weekly - 2 sets - 10 reps - Chest Press with Resistance  - 1 x daily - 6 x weekly - 2 sets - 10-20 reps - Seated Shoulder Flexion  - 2 x daily - 6 x weekly - 2 sets - 10 reps - Seated Shoulder Abduction with Bent Elbow  - 2 x daily - 6 x weekly - 2 sets - 10 reps ASSESSMENT:  CLINICAL IMPRESSION: Plan is to finish PT today and transition to independent program with HEP progressions provided. Her ROM is great but still lacks some strength but she feels confident she can continue to improve this at home with the exercises.   OBJECTIVE IMPAIRMENTS: decreased activity tolerance for ADL's,, decreased endurance, decreased mobility, decreased ROM, decreased strength, impaired flexibility, impaired UE use, and pain.  ACTIVITY LIMITATIONSlifting, carry, reaching, cleaning,  driving, and or occupation  PERSONAL FACTORS: see above PMH  also affecting patient's functional outcome.  REHAB POTENTIAL:  Good  CLINICAL DECISION MAKING: Stable/uncomplicated  EVALUATION COMPLEXITY: Low    GOALS: Short term PT Goals Target date: 02/29/2024   Pt will be I and compliant with HEP. Baseline:  Goal status: MET 02/11/24 Pt will decrease pain by 25% overall with arm movements Baseline: Goal status: MET 04/14/24  Long term PT goals Target date:04/25/2024   Pt will improve Rt sholder AROM to [facility] to improve functional mobility Baseline: Goal status: ongoing 04/14/24 Pt will improve  strength to at least 4+/5 MMT to improve functional strength Baseline: Goal status: ongoing 9/22/5 Pt will improve Patient specific functional scale (PSFS) to at least 7/10 to show improved function level Baseline: Goal status: ongoing 04/14/24 Pt will reduce pain to overall less than 3/10 with usual activity and work activity. Baseline: Goal status: ongoing 04/14/24  PLAN: PT FREQUENCY: 1-3 times per week   PT DURATION: 8-12 weeks  PLANNED INTERVENTIONS (unless contraindicated):  97110-Therapeutic exercises, 97530- Therapeutic activity, [id number]- Neuromuscular re-education, 97535- Self Care, 02859- Manual therapy, G0283- Electrical stimulation (unattended), 97016- Vasopneumatic device, and [id number]- Ultrasound  PLAN FOR NEXT SESSION: DC today.  NEXT MD VISIT: 04/15/24  Redell JONELLE Moose, PT, DPT 04/16/2024, 8:59 AM

## 2024-05-07 ENCOUNTER — Encounter: Payer: Self-pay | Admitting: Family Medicine

## 2024-05-07 DIAGNOSIS — R251 Tremor, unspecified: Secondary | ICD-10-CM

## 2024-05-07 DIAGNOSIS — G2119 Other drug induced secondary parkinsonism: Secondary | ICD-10-CM

## 2024-05-14 ENCOUNTER — Ambulatory Visit: Admitting: Family Medicine

## 2024-05-14 VITALS — BP 113/71 | HR 122 | Temp 98.2°F | Ht 66.0 in | Wt 284.0 lb

## 2024-05-14 DIAGNOSIS — G8929 Other chronic pain: Secondary | ICD-10-CM

## 2024-05-14 DIAGNOSIS — R748 Abnormal levels of other serum enzymes: Secondary | ICD-10-CM | POA: Diagnosis not present

## 2024-05-14 DIAGNOSIS — R251 Tremor, unspecified: Secondary | ICD-10-CM | POA: Diagnosis not present

## 2024-05-14 DIAGNOSIS — F319 Bipolar disorder, unspecified: Secondary | ICD-10-CM

## 2024-05-14 DIAGNOSIS — M25561 Pain in right knee: Secondary | ICD-10-CM

## 2024-05-14 DIAGNOSIS — N3946 Mixed incontinence: Secondary | ICD-10-CM | POA: Diagnosis not present

## 2024-05-14 DIAGNOSIS — M25562 Pain in left knee: Secondary | ICD-10-CM

## 2024-05-14 LAB — CMP14+EGFR
ALT: 48 IU/L — ABNORMAL HIGH (ref 0–32)
AST: 56 IU/L — ABNORMAL HIGH (ref 0–40)
Albumin: 4 g/dL (ref 3.8–4.9)
Alkaline Phosphatase: 102 IU/L (ref 49–135)
BUN/Creatinine Ratio: 15 (ref 9–23)
BUN: 14 mg/dL (ref 6–24)
Bilirubin Total: 0.5 mg/dL (ref 0.0–1.2)
CO2: 26 mmol/L (ref 20–29)
Calcium: 10.1 mg/dL (ref 8.7–10.2)
Chloride: 100 mmol/L (ref 96–106)
Creatinine, Ser: 0.94 mg/dL (ref 0.57–1.00)
Globulin, Total: 2.9 g/dL (ref 1.5–4.5)
Glucose: 123 mg/dL — ABNORMAL HIGH (ref 70–99)
Potassium: 4.6 mmol/L (ref 3.5–5.2)
Sodium: 140 mmol/L (ref 134–144)
Total Protein: 6.9 g/dL (ref 6.0–8.5)
eGFR: 70 mL/min/1.73 (ref >59)

## 2024-05-14 MED ORDER — MIRABEGRON ER 25 MG PO TB24: 25.0000 mg | ORAL_TABLET | Freq: Every day | ORAL | 3 refills | Status: DC

## 2024-05-14 MED ORDER — ARIPIPRAZOLE 2 MG PO TABS: ORAL_TABLET | ORAL | 0 refills | Status: DC

## 2024-05-14 NOTE — Patient Instructions (Signed)
 Cut Abilify  in half for 1 week. Then start 2 mg daily for 1 week, then every other day for 1 week, then stop.

## 2024-05-14 NOTE — Progress Notes (Unsigned)
 Established Patient Office Visit  Subjective   Patient ID: Belinda Turner, female    DOB: Aug 04, 1965  Age: 58 y.o. MRN: [id number]  Chief Complaint  Patient presents with   Medical Management of Chronic Issues    HPI  History of Present Illness   Belinda Turner is a 58 year old female who presents with knee pain and tremor.  She experiences significant bilateral knee pain, with the right knee being more severe. The pain is intense, causing emotional distress and limiting her ability to walk. Despite performing stretches and walking, the pain persists. She mentions that her knee pain cannot be addressed per her ortho until December due to her shoulder recovery. She has not had a steroid injection since March and reports difficulty walking due to knee pain. Her shoulder is still healing from a previous surgery, limiting her ability to lift her arm.  She started taking 10 mg of Abilify  daily six weeks ago, which has helped her feel calmer, but she developed a tremor two to three weeks ago. She has a history of Parkinson's symptoms and previously experienced a tremor with Vraylar . She was evaluated by neurology who felt that her symptoms were induced by years of vraylar . She notes the tremor in her legs when sitting down, but she does not feel it when lying down. Her husband has noted a tremor in her hands and arms. She is concerned about discontinuing Abilify  due to its positive effects on her mood. She denies any changes in her gait.  She experiences urinary incontinence, particularly when she cannot reach the bathroom in time. She wears a Poise pad but still has accidents, especially when sneezing or laughing. She has not tried any overactive bladder medications before. No vaginal bleeding.          05/14/2024    9:32 AM 03/28/2024    8:54 AM 12/25/2023    8:35 AM  Depression screen PHQ 2/9  Decreased Interest 0 1 0  Down, Depressed, Hopeless 0 1 0  PHQ - 2 Score 0 2 0  Altered  sleeping 0 1 1  Tired, decreased energy 1 1 0  Change in appetite 0 0 0  Feeling bad or failure about yourself  0 0 0  Trouble concentrating 0 0 0  Moving slowly or fidgety/restless 0 0 0  Suicidal thoughts 0 0 0  PHQ-9 Score 1 4 1   Difficult doing work/chores Not difficult at all Not difficult at all Not difficult at all      05/14/2024    9:33 AM 03/28/2024    8:55 AM 12/25/2023    8:35 AM 06/26/2023    8:59 AM  GAD 7 : Generalized Anxiety Score  Nervous, Anxious, on Edge 0 1 1 0  Control/stop worrying 0 0 0 0  Worry too much - different things 0 0 0 0  Trouble relaxing 0 1 1 0  Restless 0 0 0 0  Easily annoyed or irritable 0 1 0 0  Afraid - awful might happen 0 0 0 0  Total GAD 7 Score 0 3 2 0  Anxiety Difficulty Not difficult at all Not difficult at all Not difficult at all Not difficult at all       ROS As per HPI.    Objective:     BP 113/71   Pulse (!) 122   Temp 98.2 F (36.8 C) (Temporal)   Ht 5' 6 (1.676 m)   Wt 284 lb (128.8 kg)  SpO2 98%   BMI 45.84 kg/m  Wt Readings from Last 3 Encounters:  05/14/24 284 lb (128.8 kg)  03/28/24 283 lb 6.4 oz (128.5 kg)  01/14/24 288 lb (130.6 kg)      Physical Exam Constitutional:      General: She is not in acute distress.    Appearance: She is obese. She is not ill-appearing, toxic-appearing or diaphoretic.  Cardiovascular:     Rate and Rhythm: Normal rate and regular rhythm.     Heart sounds: Normal heart sounds. No murmur heard. Pulmonary:     Effort: Pulmonary effort is normal.     Breath sounds: Normal breath sounds.  Musculoskeletal:     Cervical back: Neck supple.     Right lower leg: No edema.     Left lower leg: No edema.  Skin:    General: Skin is warm and dry.  Neurological:     Mental Status: She is alert and oriented to person, place, and time.     Sensory: Sensation is intact.     Motor: Tremor (tremor to BLE at rest) present. No weakness.     Gait: Gait abnormal (antalgic gait).       No results found for any visits on 05/14/24.    The ASCVD Risk score (Arnett DK, et al., 2019) failed to calculate for the following reasons:   Risk score cannot be calculated because patient has a medical history suggesting prior/existing ASCVD    Assessment & Plan:   Belinda Turner was seen today for medical management of chronic issues.  Diagnoses and all orders for this visit:  Bipolar 1 disorder (HCC) -     ARIPiprazole  (ABILIFY ) 2 MG tablet; Take 2 mg by mouth daily for 1 week, then every other day for 1 week.  Tremor  Mixed urge and stress incontinence -     mirabegron ER (MYRBETRIQ) 25 MG TB24 tablet; Take 1 tablet (25 mg total) by mouth daily.  Elevated liver enzymes -     CMP14+EGFR  Chronic pain of both knees   Assessment and Plan    Bilateral knee pain Chronic severe bilateral knee pain, right worse than left, impacting ambulation. Awaiting further intervention until December per ortho. - Continue current pain management strategies including stretching and walking as tolerated. - Continue follow up with ortho  Urge and stress urinary incontinence Experiencing urge and stress urinary incontinence, prefers medication over surgical options. - Initiate Myrbetriq 25 mg extended release at night. - Monitor response to medication and consider increasing dose in one month if needed. - Consider referral to urology if symptoms persist despite medication.  Tremor Tremor in lower extremities potentially induced by Abilify , similar reaction to Vraylar . Plan to discontinue Abilify  to assess if tremor resolves. - Taper off Abilify : half current dose for one week, then 2 mg tablet for one week, then every other day for one week, then stop. - Re-evaluate tremor and overall condition in four weeks after being off Abilify  for one week.  Bipolar disorder  Depression previously managed with Abilify , improved symptoms but may have caused tremor. Concerns about depressive  state after discontinuing Abilify . - Monitor mood and depressive symptoms after discontinuing Abilify . - Consider alternative medications if depressive symptoms return after Abilify  discontinuation.     Elevated liver enzymes US  consistent with fatty liver. Will recheck today.    Return in about 4 weeks (around 06/11/2024) for medication follow up.   The patient indicates understanding of these issues and agrees with the  plan.  Annabella CHRISTELLA Search, FNP

## 2024-05-15 ENCOUNTER — Ambulatory Visit: Payer: Self-pay | Admitting: Family Medicine

## 2024-05-16 ENCOUNTER — Encounter: Payer: Self-pay | Admitting: Family Medicine

## 2024-05-16 DIAGNOSIS — R251 Tremor, unspecified: Secondary | ICD-10-CM | POA: Insufficient documentation

## 2024-05-16 DIAGNOSIS — G8929 Other chronic pain: Secondary | ICD-10-CM | POA: Insufficient documentation

## 2024-05-16 DIAGNOSIS — N3946 Mixed incontinence: Secondary | ICD-10-CM | POA: Insufficient documentation

## 2024-05-26 ENCOUNTER — Encounter: Payer: Self-pay | Admitting: Radiology

## 2024-06-04 NOTE — Addendum Note (Signed)
 Addended by: JOESPH ANNABELLA HERO on: 06/04/2024 12:37 PM   Modules accepted: Orders

## 2024-06-11 ENCOUNTER — Encounter: Payer: Self-pay | Admitting: Family Medicine

## 2024-06-11 ENCOUNTER — Ambulatory Visit (INDEPENDENT_AMBULATORY_CARE_PROVIDER_SITE_OTHER): Payer: Self-pay | Admitting: Family Medicine

## 2024-06-11 DIAGNOSIS — R251 Tremor, unspecified: Secondary | ICD-10-CM

## 2024-06-11 DIAGNOSIS — Z8673 Personal history of transient ischemic attack (TIA), and cerebral infarction without residual deficits: Secondary | ICD-10-CM

## 2024-06-11 DIAGNOSIS — R748 Abnormal levels of other serum enzymes: Secondary | ICD-10-CM | POA: Diagnosis not present

## 2024-06-11 DIAGNOSIS — F319 Bipolar disorder, unspecified: Secondary | ICD-10-CM | POA: Diagnosis not present

## 2024-06-11 DIAGNOSIS — Z6841 Body Mass Index (BMI) 40.0 and over, adult: Secondary | ICD-10-CM

## 2024-06-11 MED ORDER — WEGOVY 0.25 MG/0.5ML ~~LOC~~ SOAJ: 0.2500 mg | SUBCUTANEOUS | 0 refills | Status: DC

## 2024-06-11 NOTE — Progress Notes (Signed)
 Established Patient Office Visit  Subjective   Patient ID: Belinda Turner, female    DOB: Feb 05, 1966  Age: 58 y.o. MRN: [id number]  Chief Complaint  Patient presents with   Depression    HPI  History of Present Illness   Belinda Turner is a 58 year old female who presents with tremors and joint pain.  She stopped taking Abilify  a week and a day ago, and since then, she continues to experience tremors in her legs, although this have improved. The tremors in her hands have also improved. The tremors affect her handwriting and are particularly bothersome at bedtime, impacting her sleep. Her referral to neurology has been authorized but an appointment has not been scheduled yet.   She describes a recent incident where she felt a pop in her knee upon standing, which almost caused her to fall. She continues to experience pain, although it is less severe than before, and it previously disrupted her sleep. She has an upcoming appointment with orthopedics next month.   She asks about the possibility of starting a GLP-1 medication for weight loss, as she is not diabetic but has a history of two strokes, the last one occurring in 2016. She has met her insurance deductible for the year and is considering Wegovy  if her insurance covers this. There is no family history of MTC or MEN2.  No sleep apnea. She had her thyroid  removed due to overactivity and a non-cancerous nodule, which was treated with radiation on the left side and surgical removal on the right side. She mentions experiencing anxiety and depression symptoms, feeling 'really, really uptight' when provoked, but is hesitant to start new medications due to previous adverse reactions. She acknowledges her bipolar disorder and discusses her family history of bipolar disorder.         06/11/2024    8:21 AM 05/14/2024    9:32 AM 03/28/2024    8:54 AM  Depression screen PHQ 2/9  Decreased Interest 0 0 1  Down, Depressed, Hopeless 0 0  1  PHQ - 2 Score 0 0 2  Altered sleeping 0 0 1  Tired, decreased energy 1 1 1   Change in appetite 0 0 0  Feeling bad or failure about yourself  0 0 0  Trouble concentrating 1 0 0  Moving slowly or fidgety/restless 0 0 0  Suicidal thoughts 0 0 0  PHQ-9 Score 2 1  4    Difficult doing work/chores Not difficult at all Not difficult at all Not difficult at all     Data saved with a previous flowsheet row definition      06/11/2024    8:22 AM 05/14/2024    9:33 AM 03/28/2024    8:55 AM 12/25/2023    8:35 AM  GAD 7 : Generalized Anxiety Score  Nervous, Anxious, on Edge 1 0 1 1  Control/stop worrying 0 0 0 0  Worry too much - different things 0 0 0 0  Trouble relaxing 0 0 1 1  Restless 0 0 0 0  Easily annoyed or irritable 0 0 1 0  Afraid - awful might happen 0 0 0 0  Total GAD 7 Score 1 0 3 2  Anxiety Difficulty Not difficult at all Not difficult at all Not difficult at all Not difficult at all       ROS As per HPI.    Objective:     BP 116/76   Pulse (!) 105   Temp 97.9 F (36.6  C) (Temporal)   Ht 5' 6 (1.676 m)   Wt 287 lb 3.2 oz (130.3 kg)   SpO2 99%   BMI 46.36 kg/m    Physical Exam Vitals reviewed.  Constitutional:      General: She is not in acute distress.    Appearance: She is obese. She is not ill-appearing, toxic-appearing or diaphoretic.  Cardiovascular:     Rate and Rhythm: Normal rate and regular rhythm.     Heart sounds: Normal heart sounds. No murmur heard. Pulmonary:     Effort: Pulmonary effort is normal.     Breath sounds: Normal breath sounds.  Musculoskeletal:     Cervical back: Neck supple.     Right lower leg: No edema.     Left lower leg: No edema.  Skin:    General: Skin is warm and dry.  Neurological:     Mental Status: She is alert and oriented to person, place, and time.     Sensory: Sensation is intact.     Motor: Tremor (minimal tremor to BLE at rest) present. No weakness.     Gait: Gait abnormal (antalgic gait).      No  results found for any visits on 06/11/24.    The ASCVD Risk score (Arnett DK, et al., 2019) failed to calculate for the following reasons:   Risk score cannot be calculated because patient has a medical history suggesting prior/existing ASCVD    Assessment & Plan:   Belinda Turner was seen today for depression.  Diagnoses and all orders for this visit:  Morbid obesity (HCC) -     semaglutide -weight management (WEGOVY ) 0.25 MG/0.5ML SOAJ SQ injection; Inject 0.25 mg into the skin once a week.  History of CVA (cerebrovascular accident) -     semaglutide -weight management (WEGOVY ) 0.25 MG/0.5ML SOAJ SQ injection; Inject 0.25 mg into the skin once a week.  Bipolar 1 disorder (HCC)  Tremor  Elevated liver enzymes -     Hepatic Function Panel   Assessment and Plan    Morbid obesity due to excess calories History of CVA x 2 BMI 46. Discussed GLP-1 agonist therapy for weight loss. Discussed side effects and insurance coverage issues. - Initiated Wegovy  at 0.25 mg once weekly. Discussed monthly titration to maintenance dosage of 2.4 mg weekly. - Eat a well balanced, low calorie diet. Exercise as tolerated with goal of 30 minutes of moderate intensity exercise daily.   Drug induced tremor Tremors improving post-Abilify  discontinuation, suggesting further resolution over time. - Referral to neurology has been authorized, not yet scheduled.   Chronic bilateral knee pain - Continue with scheduled orthopedic appointment in December.  Elevated LFTs - Continue monitoring liver function tests.  Bipolar disorder with depression and anxiety symptoms Anxiety and depression symptoms present, exacerbated by stress. Discussed but deferred psychiatry referral. - Continue to monitor mental health symptoms and consider psychiatric referral if symptoms worsen.       Return in about 8 weeks (around 08/06/2024) for medication follow up.   The patient indicates understanding of these issues and  agrees with the plan. Annabella CHRISTELLA Search, FNP

## 2024-06-11 NOTE — Patient Instructions (Signed)
 Belinda Turner [address] [place] [address] [phone]

## 2024-06-12 ENCOUNTER — Ambulatory Visit: Payer: Self-pay | Admitting: Family Medicine

## 2024-06-12 DIAGNOSIS — R748 Abnormal levels of other serum enzymes: Secondary | ICD-10-CM

## 2024-06-12 LAB — HEPATIC FUNCTION PANEL
ALT: 45 IU/L — ABNORMAL HIGH (ref 0–32)
AST: 51 IU/L — ABNORMAL HIGH (ref 0–40)
Albumin: 4.2 g/dL (ref 3.8–4.9)
Alkaline Phosphatase: 103 IU/L (ref 49–135)
Bilirubin Total: 0.6 mg/dL (ref 0.0–1.2)
Bilirubin, Direct: 0.29 mg/dL (ref 0.00–0.40)
Total Protein: 6.8 g/dL (ref 6.0–8.5)

## 2024-06-16 ENCOUNTER — Other Ambulatory Visit (HOSPITAL_COMMUNITY): Payer: Self-pay

## 2024-06-16 ENCOUNTER — Telehealth: Payer: Self-pay | Admitting: *Deleted

## 2024-06-16 ENCOUNTER — Encounter: Payer: Self-pay | Admitting: Family Medicine

## 2024-06-16 DIAGNOSIS — Z6841 Body Mass Index (BMI) 40.0 and over, adult: Secondary | ICD-10-CM | POA: Insufficient documentation

## 2024-06-16 NOTE — Telephone Encounter (Signed)
 Patient aware

## 2024-06-16 NOTE — Telephone Encounter (Signed)
 Belinda Turner  (Belinda Turner) Wegovy  0.25MG /0.5ML auto-injectors  Sent to Plan

## 2024-06-17 ENCOUNTER — Other Ambulatory Visit (HOSPITAL_BASED_OUTPATIENT_CLINIC_OR_DEPARTMENT_OTHER): Payer: Self-pay

## 2024-06-23 ENCOUNTER — Other Ambulatory Visit (HOSPITAL_COMMUNITY): Payer: Self-pay

## 2024-07-11 ENCOUNTER — Other Ambulatory Visit: Payer: Self-pay | Admitting: Family Medicine

## 2024-07-11 DIAGNOSIS — Z1231 Encounter for screening mammogram for malignant neoplasm of breast: Secondary | ICD-10-CM

## 2024-07-14 ENCOUNTER — Inpatient Hospital Stay: Admission: RE | Admit: 2024-07-14 | Discharge: 2024-07-14 | Attending: Family Medicine

## 2024-07-14 DIAGNOSIS — Z1231 Encounter for screening mammogram for malignant neoplasm of breast: Secondary | ICD-10-CM

## 2024-07-15 ENCOUNTER — Encounter: Payer: Self-pay | Admitting: Orthopedic Surgery

## 2024-07-15 ENCOUNTER — Ambulatory Visit (INDEPENDENT_AMBULATORY_CARE_PROVIDER_SITE_OTHER): Admitting: Orthopedic Surgery

## 2024-07-15 DIAGNOSIS — Z96611 Presence of right artificial shoulder joint: Secondary | ICD-10-CM

## 2024-07-15 DIAGNOSIS — G8929 Other chronic pain: Secondary | ICD-10-CM | POA: Diagnosis not present

## 2024-07-15 DIAGNOSIS — M170 Bilateral primary osteoarthritis of knee: Secondary | ICD-10-CM | POA: Diagnosis not present

## 2024-07-15 NOTE — Patient Instructions (Signed)

## 2024-07-15 NOTE — Progress Notes (Signed)
 Orthopaedic Postop Note  Assessment: Belinda Turner is a 58 y.o. female s/p Right Anatomic Total Shoulder Arthroplasty  DOS: 01/21/2024  Bilateral knee pain; right knee with more advanced arthritis than the left knee.    Plan: Belinda Turner states that the right shoulder is doing better.  She keeps working on her motion.  She has no pain in the shoulder.  She is pleased with her progress.  She has more concerns about her knees currently.  Right is worse than the left.  We have older radiographs in our system, which demonstrates some degenerative changes bilaterally.  Unfortunately, her BMI is currently 45.  We discussed that she would need to lose weight in order to be considered a candidate for total knee arthroplasty.  She states understanding.  She is interested in bilateral knee injections.  These were completed in clinic today.  Procedure note injection Right knee joint   Verbal consent was obtained to inject the right knee joint  Timeout was completed to confirm the site of injection.  The skin was prepped with alcohol and ethyl chloride was sprayed at the injection site.  A 21-gauge needle was used to inject 40 mg of Depo-Medrol  and 1% lidocaine  (4 cc) into the right knee using an anterolateral approach.  There were no complications. A sterile bandage was applied.   Procedure note injection Left knee joint   Verbal consent was obtained to inject the left knee joint  Timeout was completed to confirm the site of injection.  The skin was prepped with alcohol and ethyl chloride was sprayed at the injection site.  A 21-gauge needle was used to inject 40 mg of Depo-Medrol  and 1% lidocaine  (4 cc) into the left knee using an anterolateral approach.  There were no complications. A sterile bandage was applied.      Follow-up: Return in about 6 months (around 01/13/2025).  XR at next visit: Right shoulder  Subjective:  Chief Complaint  Patient presents with   Post-op Follow-up     Shoulder/ right/ sore today had mammogram yesterday and was in position that increased pain some     History of Present Illness: Belinda Turner is a 58 y.o. female who presents following the above stated procedure.  Surgery was approximately 6 months ago.  Her shoulder feels better.  She continues to work on motion.  She is doing exercises on her own.  She denies pain in the right shoulder.  She does state that she has more severe pain in bilateral knees.  Right is worse than left.  She has had injections in the past, with limited improvement.  I have injected the pes anserine bursa, with limited sustained relief.  She has expressed interest in considering surgery for her knees.  She has discussed weight loss with her primary care provider, but insurance will not authorize injections which could help her lose weight.  She underwent an mammogram yesterday, notes that the anterior aspect of the right shoulder is more painful today.  Review of Systems: No fevers or chills No numbness or tingling No Chest Pain No shortness of breath   Objective: There were no vitals taken for this visit.  Physical Exam:  Alert and oriented, no acute distress  Surgical incision has healed.  Mild hypertrophic scarring.  No surrounding erythema or drainage.  Sensation in the lateral shoulder is intact.  Fingers are warm and well-perfused.  Sensation tact throughout the right hand.  Active forward flexion to 90 degrees.  With  some assistance, she can get above her head, and keep it there.  Abduction to 90 degrees.  She tolerates some external rotation at her side.  Negative belly press.  Right knee without deformity.  No bruising.  No redness.  She has good range of motion.  No increased laxity to varus or valgus stress.  Negative Lachman.  Tenderness over the lateral joint line  Left knee without deformity.  No bruising.  No redness.  No increased laxity varus or valgus stress.  Tenderness palpation along the  medial joint line.   IMAGING: I personally ordered and reviewed the following images:  No new imaging obtained today.   Oneil [name], MD 07/15/2024 9:32 AM

## 2024-08-06 ENCOUNTER — Encounter: Payer: Self-pay | Admitting: Family Medicine

## 2024-08-06 ENCOUNTER — Ambulatory Visit: Admitting: Family Medicine

## 2024-08-06 VITALS — BP 134/84 | HR 87 | Temp 98.2°F | Ht 66.0 in | Wt 291.0 lb

## 2024-08-06 DIAGNOSIS — Z8673 Personal history of transient ischemic attack (TIA), and cerebral infarction without residual deficits: Secondary | ICD-10-CM | POA: Diagnosis not present

## 2024-08-06 DIAGNOSIS — F319 Bipolar disorder, unspecified: Secondary | ICD-10-CM | POA: Diagnosis not present

## 2024-08-06 DIAGNOSIS — G43109 Migraine with aura, not intractable, without status migrainosus: Secondary | ICD-10-CM | POA: Diagnosis not present

## 2024-08-06 DIAGNOSIS — N3946 Mixed incontinence: Secondary | ICD-10-CM

## 2024-08-06 MED ORDER — BUPROPION HCL ER (XL) 150 MG PO TB24: 150.0000 mg | ORAL_TABLET | Freq: Every day | ORAL | 1 refills | Status: AC

## 2024-08-06 MED ORDER — NURTEC 75 MG PO TBDP: 75.0000 mg | ORAL_TABLET | Freq: Every day | ORAL | 5 refills | Status: AC | PRN

## 2024-08-06 MED ORDER — MIRABEGRON ER 50 MG PO TB24: 50.0000 mg | ORAL_TABLET | Freq: Every day | ORAL | 3 refills | Status: AC

## 2024-08-06 MED ORDER — WEGOVY 1.5 MG PO TABS: 1.5000 mg | ORAL_TABLET | Freq: Every day | ORAL | 0 refills | Status: AC

## 2024-08-06 NOTE — Progress Notes (Signed)
 "  Established Patient Office Visit  Subjective   Patient ID: Belinda Turner, female    DOB: [birth year]  Age: 59 y.o. MRN: [id number]  Chief Complaint  Patient presents with   Medical Management of Chronic Issues    HPI  The patient presents with weight management concerns, and mood issues.  Weight management - Current weight is 291 pounds, decreased from 305 pounds. - She has been actively working on her diet for improvement. - Not currently able to exercise due to knee pain.  - Wellbutrin  has previously been effective for weight loss.  Mood disturbance - Mood described as 'not good'. - Tremors have resolved from abilify . - History of Wellbutrin  use with prior benefit for mood symptoms. Requesting to restart this.   Urinary urgency and incontinence - Significant urgency in urination, especially in the mornings. - Requires use of overnight pads due to soaking before reaching the bathroom. - Some improvement noted with medication, but urgency persists.  Migraine headaches - Migraines occur approximately once per month. - Symptoms include severe headache, nausea, and vomiting. Has aura before - Currently using Advil Migraine for relief. - History of Topamax use for migraine prevention. - History of stroke limits use of certain migraine medications, such as Imitrex and other triptans      08/06/2024    8:30 AM 06/11/2024    8:21 AM 05/14/2024    9:32 AM  Depression screen PHQ 2/9  Decreased Interest 1 0 0  Down, Depressed, Hopeless 1 0 0  PHQ - 2 Score 2 0 0  Altered sleeping 1 0 0  Tired, decreased energy 2 1 1   Change in appetite 0 0 0  Feeling bad or failure about yourself  0 0 0  Trouble concentrating 1 1 0  Moving slowly or fidgety/restless 0 0 0  Suicidal thoughts 0 0 0  PHQ-9 Score 6 2 1    Difficult doing work/chores Somewhat difficult Not difficult at all Not difficult at all     Data saved with a previous flowsheet row definition      08/06/2024     8:31 AM 06/11/2024    8:22 AM 05/14/2024    9:33 AM 03/28/2024    8:55 AM  GAD 7 : Generalized Anxiety Score  Nervous, Anxious, on Edge 1 1 0 1  Control/stop worrying 1 0 0 0  Worry too much - different things 1 0 0 0  Trouble relaxing 2 0 0 1  Restless 1 0 0 0  Easily annoyed or irritable 2 0 0 1  Afraid - awful might happen 0 0 0 0  Total GAD 7 Score 8 1 0 3  Anxiety Difficulty Somewhat difficult Not difficult at all Not difficult at all Not difficult at all       ROS As per HPI.   Objective:     BP 134/84   Pulse 87   Temp 98.2 F (36.8 C) (Temporal)   Ht 5' 6 (1.676 m)   Wt 291 lb (132 kg)   SpO2 100%   BMI 46.97 kg/m  Wt Readings from Last 3 Encounters:  08/06/24 291 lb (132 kg)  06/11/24 287 lb 3.2 oz (130.3 kg)  05/14/24 284 lb (128.8 kg)      Physical Exam Vitals and nursing note reviewed.  Constitutional:      General: She is not in acute distress.    Appearance: Normal appearance. She is not ill-appearing.  Cardiovascular:     Rate and Rhythm:  Normal rate and regular rhythm.     Pulses: Normal pulses.     Heart sounds: Normal heart sounds. No murmur heard. Pulmonary:     Effort: Pulmonary effort is normal. No respiratory distress.     Breath sounds: Normal breath sounds.  Musculoskeletal:     Cervical back: Neck supple. No tenderness.     Right lower leg: No edema.     Left lower leg: No edema.  Lymphadenopathy:     Cervical: No cervical adenopathy.  Skin:    General: Skin is warm and dry.  Neurological:     General: No focal deficit present.     Mental Status: She is alert and oriented to person, place, and time.  Psychiatric:        Mood and Affect: Mood normal.        Behavior: Behavior normal.      No results found for any visits on 08/06/24.    The ASCVD Risk score (Arnett DK, et al., 2019) failed to calculate for the following reasons:   Risk score cannot be calculated because patient has a medical history suggesting  prior/existing ASCVD   * - Cholesterol units were assumed    Assessment & Plan:   [name] was seen today for medical management of chronic issues.  Diagnoses and all orders for this visit:  Bipolar 1 disorder (HCC) -     buPROPion  (WELLBUTRIN  XL) 150 MG 24 hr tablet; Take 1 tablet (150 mg total) by mouth daily.  History of CVA (cerebrovascular accident) -     semaglutide -weight management (WEGOVY ) 1.5 MG tablet; Take 1 tablet (1.5 mg total) by mouth daily. Daily in AM on an empty stomach with 4 oz of water . Do not eat or drink for 30 minutes after dose.  Morbid obesity (HCC) -     semaglutide -weight management (WEGOVY ) 1.5 MG tablet; Take 1 tablet (1.5 mg total) by mouth daily. Daily in AM on an empty stomach with 4 oz of water . Do not eat or drink for 30 minutes after dose.  Mixed urge and stress incontinence -     mirabegron  ER (MYRBETRIQ ) 50 MG TB24 tablet; Take 1 tablet (50 mg total) by mouth daily.  Migraine with aura and without status migrainosus, not intractable -     Rimegepant Sulfate (NURTEC) 75 MG TBDP; Take 1 tablet (75 mg total) by mouth daily as needed (migraine).   Morbid obesity Weight reduced from 305 lbs to 291 lbs. Declined bariatric surgery. Considering Wegovy  tablet for weight management. Discussed dosing schedule and potential cost. Insurance coverage uncertain. Discussed lifestyle changes in well balanced, low calorie diet and physical activity. - Initiated Wegovy  tablet with dosing schedule: 1.5 mg for the first month, 4 mg for the second month, 9 mg for the third month, and 25 mg for the fourth month. - Discussed potential cost and insurance coverage.  Bipolar disorder Mood not well-managed. Wellbutrin  previously effective for mood and weight management. Discussed potential benefits for cravings and appetite control. - Initiated Wellbutrin  extended release 150 mg daily. - Will monitor mood and weight changes over six weeks.  Migraine with aura Migraine  with aura occurring approximately once a month. Considering Nurtec for acute management. Discussed potential side effects, including nausea. - Provided Nurtec samples for acute migraine management. - Submitted prior authorization for Nurtec. - Discussed potential side effects and provided copay card information.  Mixed urinary incontinence Morning urgency and nocturnal enuresis. Previous medication provided some improvement but not sufficient. - Increased  current medication dosage to 50 mg. - Sent prescription for increased dosage to pharmacy.  History of cerebrovascular accident Avoidance of triptans due to stroke history.  Return in about 6 weeks (around 09/17/2024) for medication follow up.  The patient indicates understanding of these issues and agrees with the plan.   Annabella CHRISTELLA Search, FNP "

## 2024-08-06 NOTE — Patient Instructions (Signed)
 Bariatric surgery referral:  [facility] [address] [place] [address] [phone]

## 2024-08-14 ENCOUNTER — Other Ambulatory Visit (HOSPITAL_COMMUNITY): Payer: Self-pay

## 2024-08-14 ENCOUNTER — Telehealth: Payer: Self-pay

## 2024-08-14 NOTE — Telephone Encounter (Signed)
 Pharmacy Patient Advocate Encounter   Received notification from [employer] that prior authorization for Nurtec 75MG  dispersible tablets  is required/requested.   Insurance verification completed.   The patient is insured through [facility].   Per test claim: PA required; PA submitted to above mentioned insurance via Latent Key/confirmation #/EOC [id number]. Status is pending

## 2024-08-18 NOTE — Telephone Encounter (Signed)
 Pharmacy Patient Advocate Encounter  Received notification from [facility] that Prior Authorization for NURTEC TAB 75MG  ODT has been APPROVED from 08/14/24 to 11/12/24   PA #/Case ID/Reference #: [id number]

## 2024-08-22 ENCOUNTER — Other Ambulatory Visit: Payer: Self-pay | Admitting: Family Medicine

## 2024-08-22 DIAGNOSIS — Z8673 Personal history of transient ischemic attack (TIA), and cerebral infarction without residual deficits: Secondary | ICD-10-CM

## 2024-08-22 DIAGNOSIS — F339 Major depressive disorder, recurrent, unspecified: Secondary | ICD-10-CM

## 2024-08-29 ENCOUNTER — Encounter: Payer: Self-pay | Admitting: Family Medicine

## 2024-09-19 ENCOUNTER — Ambulatory Visit: Admitting: Family Medicine

## 2025-01-13 ENCOUNTER — Ambulatory Visit: Admitting: Orthopedic Surgery
